# Patient Record
Sex: Male | Born: 1935 | Race: White | Hispanic: No | Smoking: Former smoker
Health system: Southern US, Community
[De-identification: ages and names within clinical notes are randomized; demographics above are authoritative.]

## PROBLEM LIST (undated history)

## (undated) DIAGNOSIS — Z87898 Personal history of other specified conditions: Secondary | ICD-10-CM

## (undated) DIAGNOSIS — Z972 Presence of dental prosthetic device (complete) (partial): Secondary | ICD-10-CM

## (undated) DIAGNOSIS — Z8711 Personal history of peptic ulcer disease: Secondary | ICD-10-CM

## (undated) DIAGNOSIS — Z8719 Personal history of other diseases of the digestive system: Secondary | ICD-10-CM

## (undated) DIAGNOSIS — G4700 Insomnia, unspecified: Secondary | ICD-10-CM

## (undated) DIAGNOSIS — H9190 Unspecified hearing loss, unspecified ear: Secondary | ICD-10-CM

## (undated) DIAGNOSIS — I10 Essential (primary) hypertension: Secondary | ICD-10-CM

## (undated) HISTORY — DX: Essential (primary) hypertension: I10

## (undated) HISTORY — PX: SEPTOPLASTY: SUR1290

## (undated) HISTORY — PX: FRACTURE SURGERY: SHX138

---

## 1964-05-05 HISTORY — PX: STOMACH SURGERY: SHX791

## 1997-05-05 HISTORY — PX: APPENDECTOMY: SHX54

## 1997-05-05 HISTORY — PX: HERNIA REPAIR: SHX51

## 2005-07-22 ENCOUNTER — Other Ambulatory Visit: Payer: Self-pay

## 2005-07-22 ENCOUNTER — Ambulatory Visit: Payer: Self-pay | Admitting: Otolaryngology

## 2005-07-23 ENCOUNTER — Ambulatory Visit: Payer: Self-pay | Admitting: Otolaryngology

## 2008-04-07 ENCOUNTER — Emergency Department: Payer: Self-pay | Admitting: Emergency Medicine

## 2008-04-17 ENCOUNTER — Emergency Department: Payer: Self-pay | Admitting: Emergency Medicine

## 2008-05-05 HISTORY — PX: BELPHAROPTOSIS REPAIR: SHX369

## 2008-07-25 ENCOUNTER — Emergency Department: Payer: Self-pay | Admitting: Emergency Medicine

## 2008-07-27 ENCOUNTER — Emergency Department: Payer: Self-pay | Admitting: Emergency Medicine

## 2010-08-03 IMAGING — CR RIGHT LITTLE FINGER 2+V
1 series · 3 of 3 positions shown · non-contrast
Comparison: none

REASON FOR EXAM: chainsaw to distal 5th digit pain
COMMENTS:

[Series 1: view not recorded · 0.17mm/px · 3 of 3 slices shown]
[im 1/3]
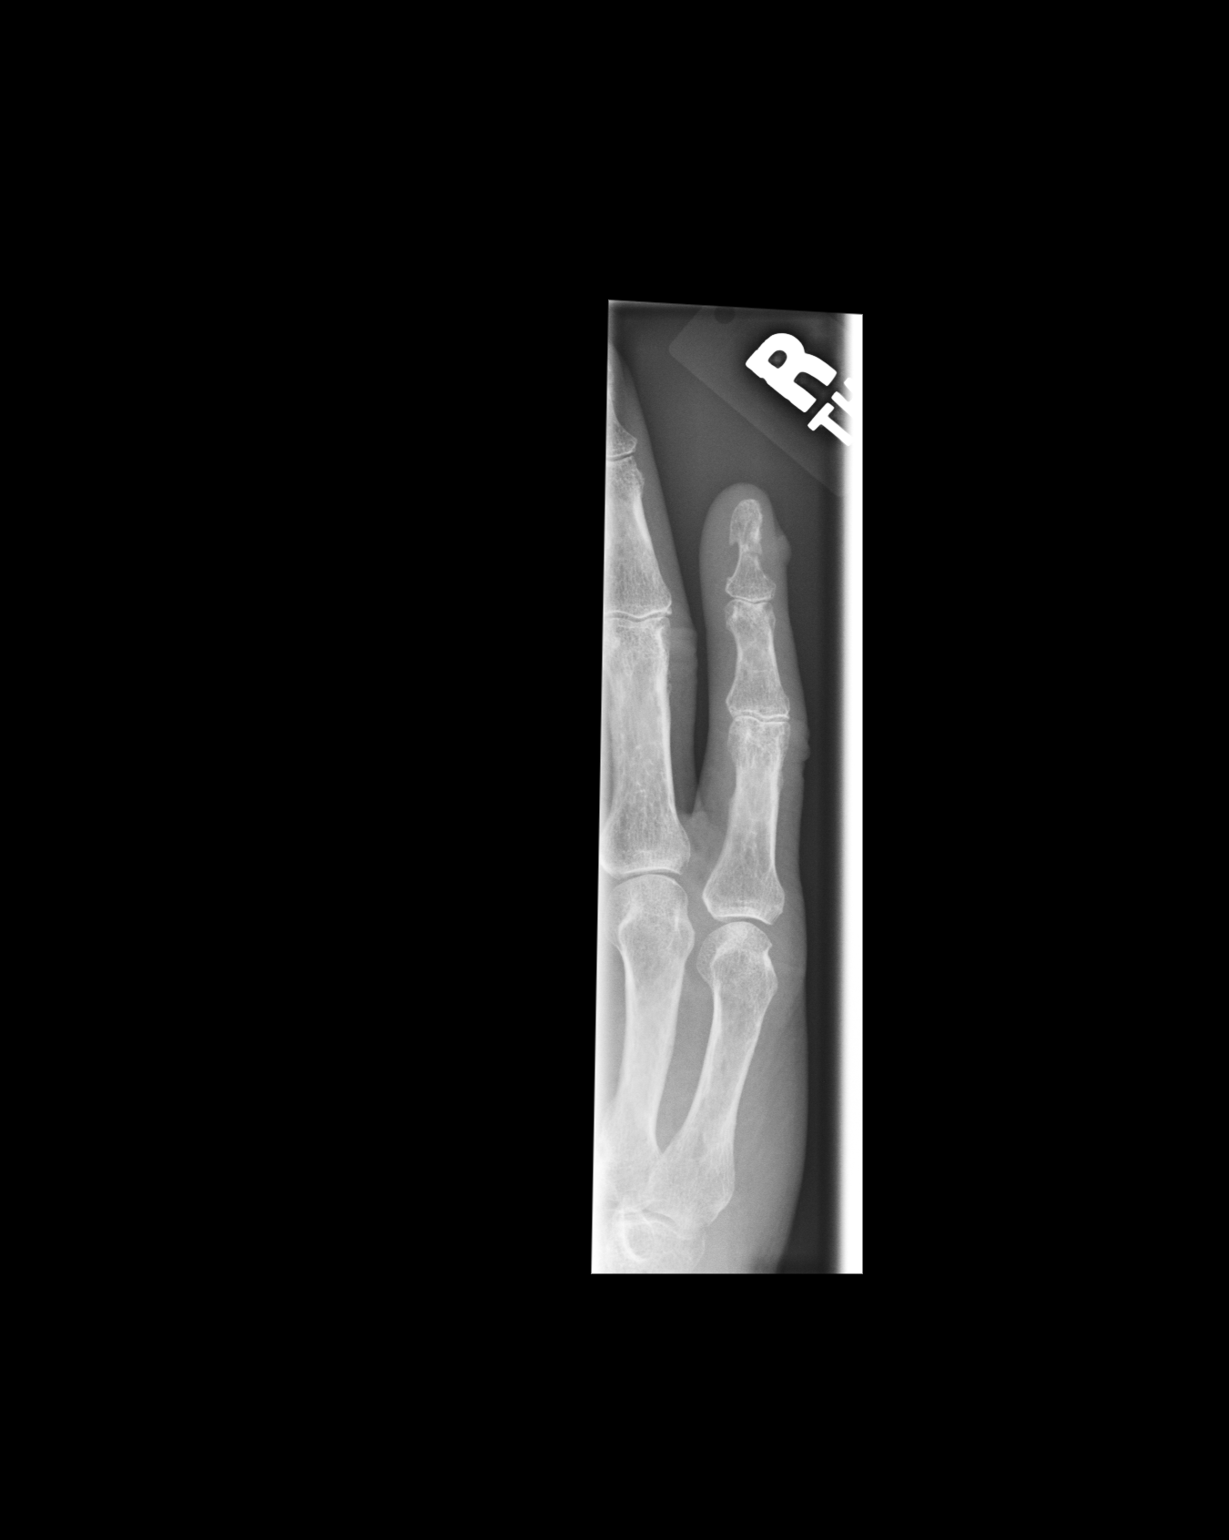
[im 2/3]
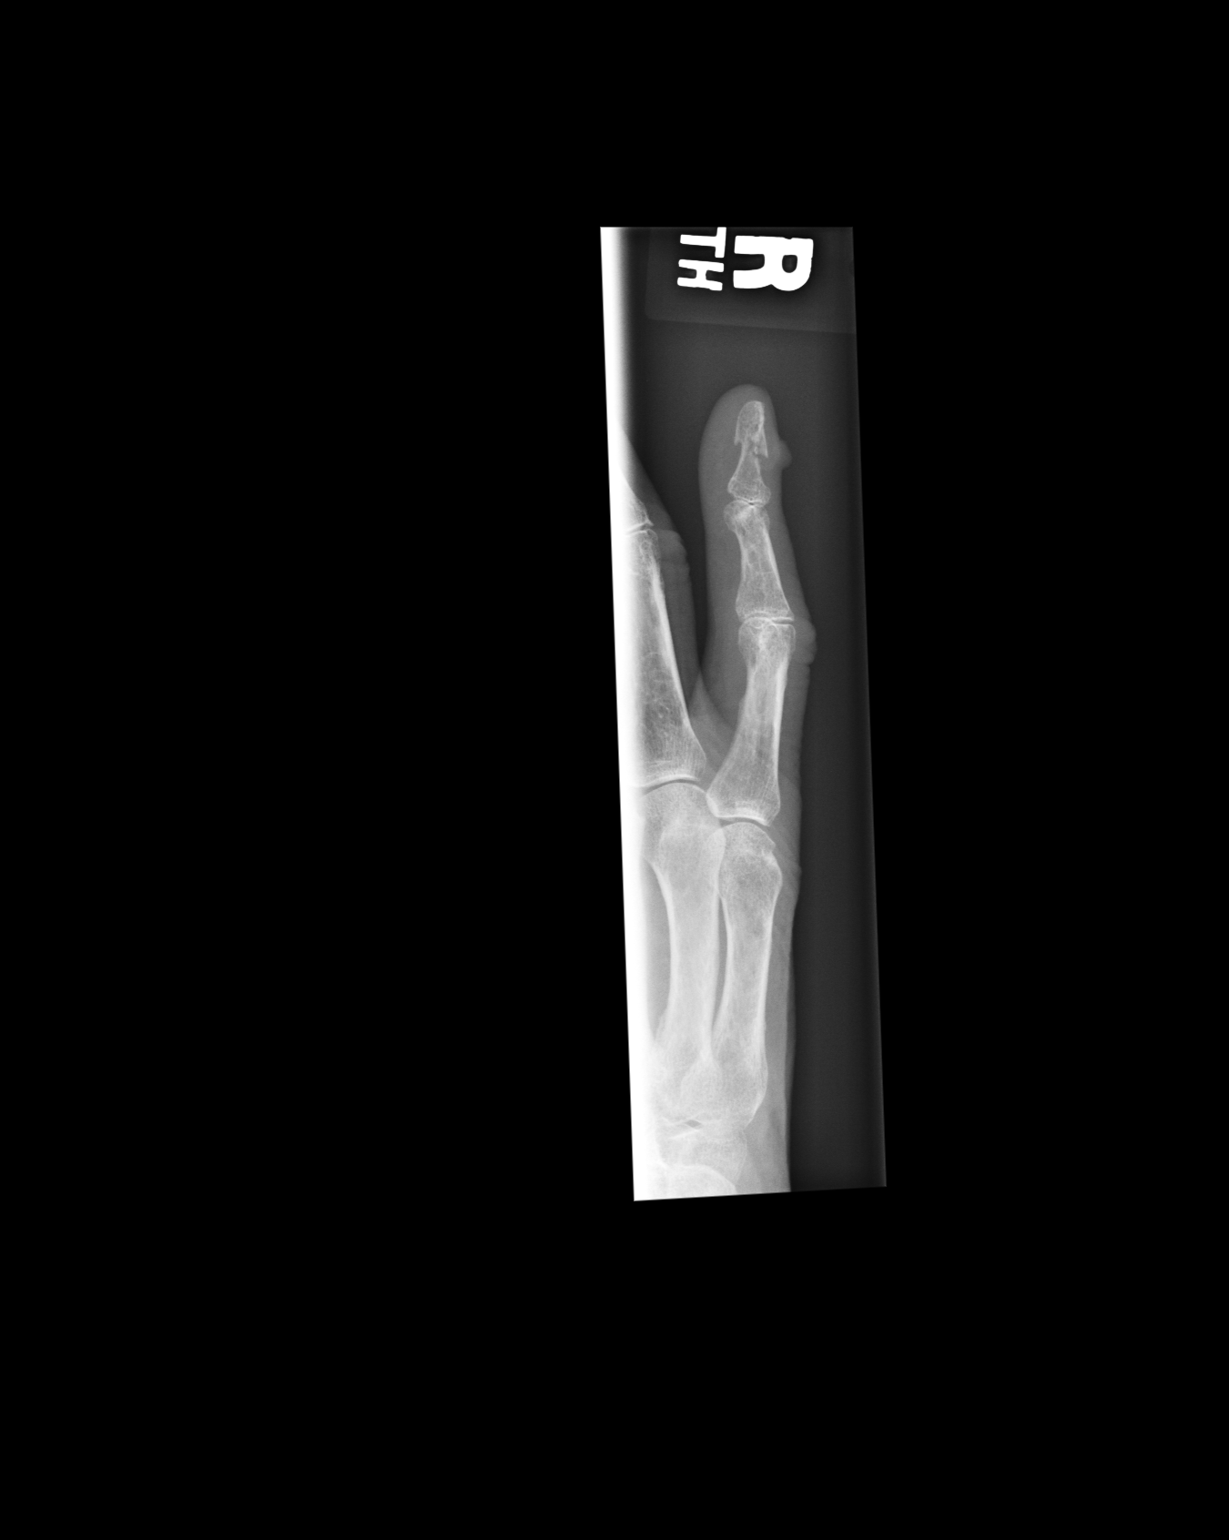
[im 3/3]
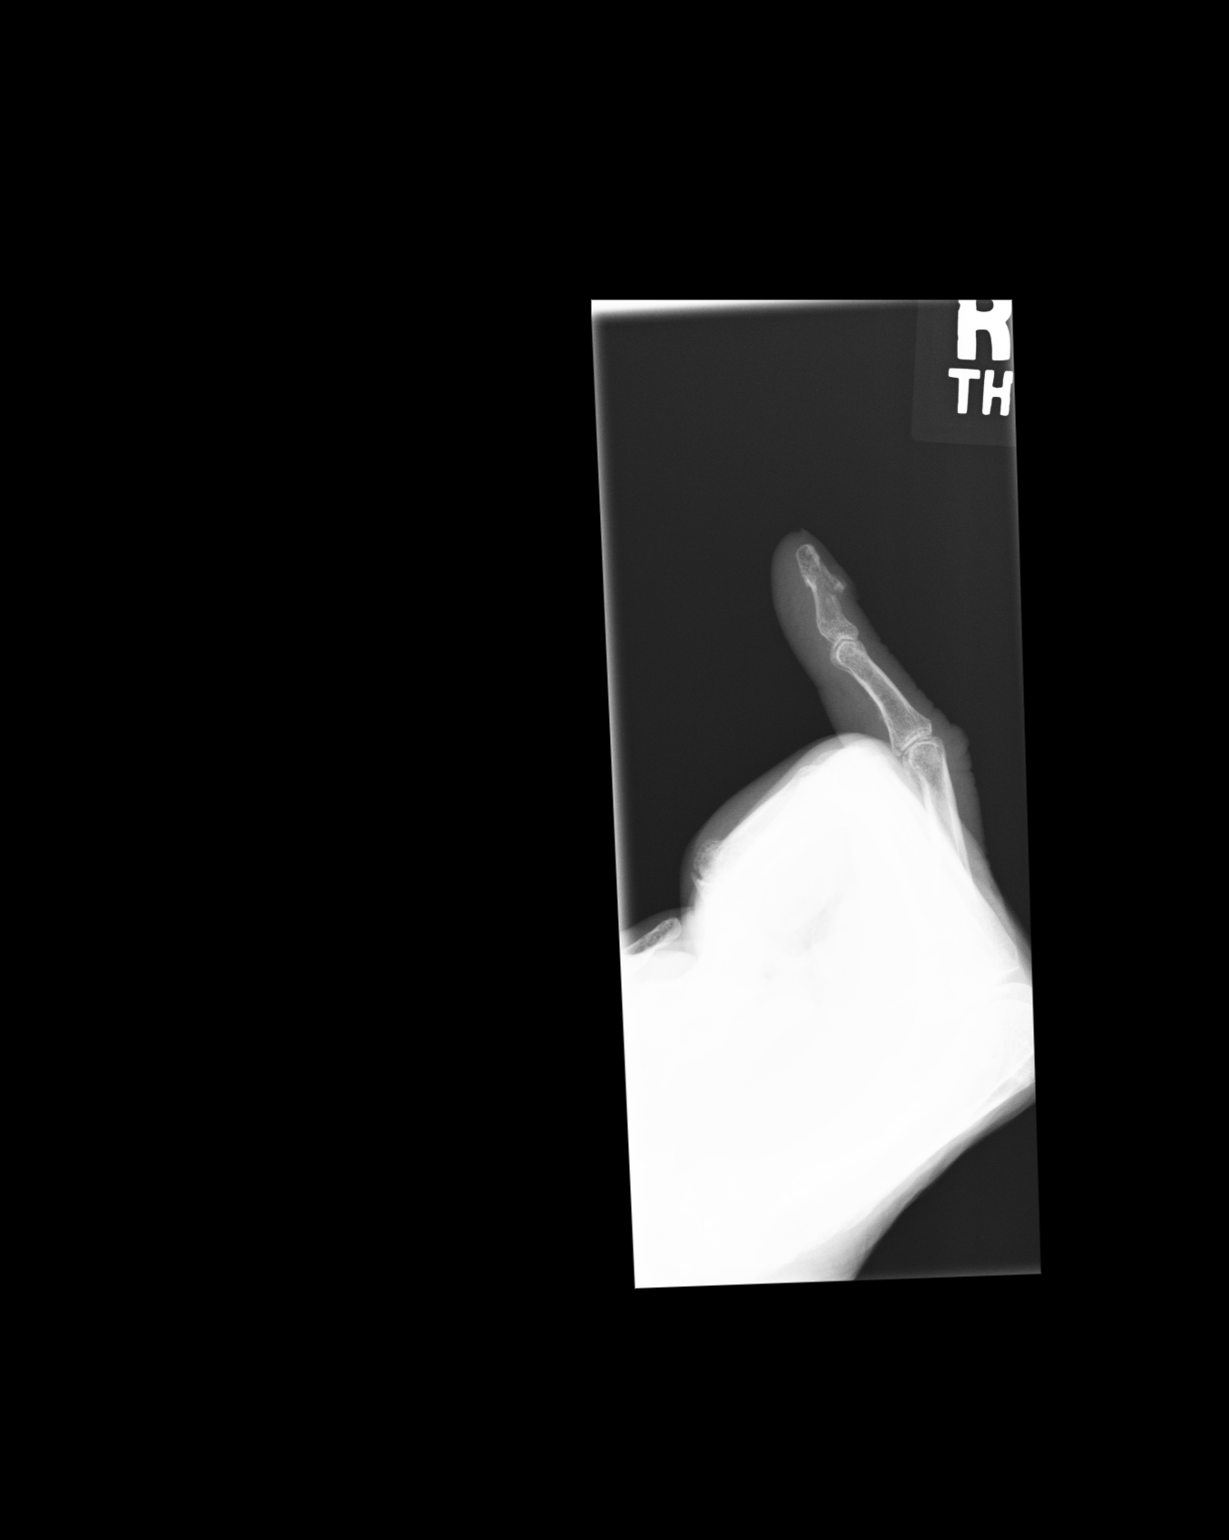

[3 of 3 positions shown; findings below may reference images not displayed]

PROCEDURE:     DXR - DXR FINGER PINKY 5TH DIGIT RT HA  - April 07, 2008 [DATE]

RESULT:     Three views were obtained. There is a fracture of the dorsal
cortex of the distal shaft of the distal phalanx of the fifth finger. The
fracture does not appear to extend entirely through the phalanx. There is
evidence of associated soft tissue injury. No foreign body is seen.
IMPRESSION: 1. There is a minimally displaced fracture of the dorsal cortex of the
diaphysis of the distal phalanx of the fifth finger.
2. Associated soft tissue injury is seen.
3. No radiodense soft tissue foreign body is noted.

## 2013-12-13 ENCOUNTER — Ambulatory Visit (INDEPENDENT_AMBULATORY_CARE_PROVIDER_SITE_OTHER): Payer: Medicare Other | Admitting: General Surgery

## 2013-12-13 ENCOUNTER — Encounter: Payer: Self-pay | Admitting: General Surgery

## 2013-12-13 VITALS — BP 118/80 | HR 76 | Resp 12 | Ht 71.0 in | Wt 167.0 lb

## 2013-12-13 DIAGNOSIS — L989 Disorder of the skin and subcutaneous tissue, unspecified: Secondary | ICD-10-CM

## 2013-12-13 NOTE — Patient Instructions (Signed)
Continue the prednisone every other day The patient is aware to call back for any questions or concerns.

## 2013-12-13 NOTE — Progress Notes (Signed)
Patient ID: Jacob Moses, male   DOB: 08-31-1927, 80 y.o.   MRN: [id number]  Chief Complaint  Patient presents with  . Other    right leg redness    HPI Jacob Moses is a 80 y.o. male.  Here today for right leg redness. States he has been treated by Dr. Koleen Nimrod for 16 months. It seems to have happened after he noticed a splinter about 19 months ago. It seems to have gotten worse and over a large area. It did have a rash at one point.  If there is any pain it comes and goes. The prednisone is every other day through August.    HPI  Past Medical History  Diagnosis Date  . Hypertension     Past Surgical History  Procedure Laterality Date  . Stomach surgery  1966    ulcer  . Appendectomy  1999    Dr.Byrnett  . Hernia repair  1999    Dr.Byrnett  . Belpharoptosis repair  2010    Family History  Problem Relation Age of Onset  . Diabetes Father   . Cancer Mother     Social History History  Substance Use Topics  . Smoking status: Former Smoker -- 30 years    Quit date: 05/05/1978  . Smokeless tobacco: Never Used  . Alcohol Use: No    No Known Allergies  Current Outpatient Prescriptions  Medication Sig Dispense Refill  . ALPRAZolam (XANAX) 1 MG tablet Take 1 mg by mouth at bedtime as needed for anxiety.      . ferrous sulfate 325 (65 FE) MG tablet Take 325 mg by mouth daily with breakfast.      . lisinopril (PRINIVIL,ZESTRIL) 20 MG tablet Take 20 mg by mouth 2 (two) times daily.      . Multiple Vitamins-Minerals (EYE VITAMINS PO) Take 1 tablet by mouth 2 (two) times daily.      . prednisoLONE 5 MG TABS tablet Take 5 mg by mouth every other day.        No current facility-administered medications for this visit.    Review of Systems Review of Systems  Constitutional: Negative.   Respiratory: Negative.   Cardiovascular: Negative.     Blood pressure 118/80, pulse 76, resp. rate 12, height 5\' 11"  (1.803 m), weight 167 lb (75.751 kg).  Physical  Exam Physical Exam  Constitutional: He is oriented to person, place, and time. He appears well-developed and well-nourished.  Cardiovascular:  Pulses:      Dorsalis pedis pulses are 1+ on the right side, and 2+ on the left side.       Posterior tibial pulses are 2+ on the right side, and 2+ on the left side.  No edema on left leg. Right leg with mild to moderate edema with mild induration of the skin extending half way up to the knee.  Neurological: He is alert and oriented to person, place, and time.  Skin: Skin is warm and dry.   The affected skin in right leg has no suggestion of venous stasis changes. There is no open area and no palpable foreign body. Data Reviewed Office notes and pathology reviewed. Pathology of skin biopsy from 1 yr ago showed findings suggestion of a FB reaction.   Assessment    No edema on left leg. Right leg with mild to moderate edema with mild induration of the skin extending half way up to the knee. The findings some sort of reactive process after he had a splinter  in the leg. Will discuss with pathologist and see if rebiopsy would be helpful.    Plan    Discuss with pathologist. Pt will be advised after.       SANKAR,SEEPLAPUTHUR G 12/15/2013, 8:06 AM

## 2013-12-15 ENCOUNTER — Encounter: Payer: Self-pay | Admitting: General Surgery

## 2013-12-19 ENCOUNTER — Telehealth: Payer: Self-pay | Admitting: *Deleted

## 2013-12-19 NOTE — Telephone Encounter (Signed)
Pts daughter called and said that you was going to speak with a pathologist about the pts leg and she was just wondering have you heard anything yet. She said to try her on her home number first [phone] or cell [phone]

## 2014-01-12 ENCOUNTER — Encounter: Payer: Self-pay | Admitting: General Surgery

## 2014-01-12 ENCOUNTER — Ambulatory Visit (INDEPENDENT_AMBULATORY_CARE_PROVIDER_SITE_OTHER): Payer: Medicare Other | Admitting: General Surgery

## 2014-01-12 VITALS — BP 150/80 | HR 70 | Resp 14 | Ht 71.0 in | Wt 162.0 lb

## 2014-01-12 DIAGNOSIS — L989 Disorder of the skin and subcutaneous tissue, unspecified: Secondary | ICD-10-CM

## 2014-01-12 DIAGNOSIS — D485 Neoplasm of uncertain behavior of skin: Secondary | ICD-10-CM

## 2014-01-12 NOTE — Progress Notes (Signed)
This is a 80 year old male here today for a right lower leg biopsy.  Right leg lower half medial with same nodular diffuse skin thickening.  5 ml 1% xylocane mixed with 0.5 % marcaine instilled over one of the nodular area. Prepped and draped-Chloro Prep. 1cm elliptical piece of skin excised. Skin closed with 3-0 nylon interrupted stitches. Dressed with neosporin, gauze and kling. No immediate problems from procedure

## 2014-01-12 NOTE — Patient Instructions (Signed)
Patient to return in 2 weeks for follow up. The patient is aware to call back for any questions or concerns.  

## 2014-01-16 ENCOUNTER — Encounter: Payer: Self-pay | Admitting: General Surgery

## 2014-01-24 ENCOUNTER — Ambulatory Visit (INDEPENDENT_AMBULATORY_CARE_PROVIDER_SITE_OTHER): Payer: Self-pay | Admitting: General Surgery

## 2014-01-24 ENCOUNTER — Encounter: Payer: Self-pay | Admitting: General Surgery

## 2014-01-24 VITALS — BP 172/74 | HR 78 | Resp 16 | Ht 71.0 in | Wt 168.0 lb

## 2014-01-24 DIAGNOSIS — L989 Disorder of the skin and subcutaneous tissue, unspecified: Secondary | ICD-10-CM

## 2014-01-24 NOTE — Patient Instructions (Signed)
Stop all creams and use utter cream.

## 2014-01-24 NOTE — Progress Notes (Signed)
This is a 80 year old male her today for his post op right leg biopsy done on 01/12/14. Patient states he is doing good but it is hurting more yesterday and today. States it "burns". The induration appears to be less in the skin of right leg except the superior edge .Biopsy site is healed. Pathology showed non specific reactive dermatitis. Additional consultation from dermatopathologist has been requested.  Sutures removed. Stop all creams he is using now and use udderr cream in place Follow up in 3 weeks.

## 2014-01-25 ENCOUNTER — Encounter: Payer: Self-pay | Admitting: General Surgery

## 2014-02-16 ENCOUNTER — Ambulatory Visit (INDEPENDENT_AMBULATORY_CARE_PROVIDER_SITE_OTHER): Payer: Medicare Other | Admitting: General Surgery

## 2014-02-16 ENCOUNTER — Encounter: Payer: Self-pay | Admitting: General Surgery

## 2014-02-16 VITALS — BP 142/64 | HR 70 | Resp 14 | Ht 71.0 in | Wt 169.0 lb

## 2014-02-16 DIAGNOSIS — L989 Disorder of the skin and subcutaneous tissue, unspecified: Secondary | ICD-10-CM

## 2014-02-16 DIAGNOSIS — L259 Unspecified contact dermatitis, unspecified cause: Secondary | ICD-10-CM

## 2014-02-16 NOTE — Progress Notes (Signed)
This is a 80 year old male her today for his post op right leg biopsy done on 01/12/14. Patient states he is doing good   Overall appearance of right leg has improved. Induration is less marked. Mild swelling of the lower half of the leg.   Maintains strong posterior tib pulse. No varicose veins seen. Continue with use of udder cream and support stockings.  Pathology revealing reactive dermatitis

## 2014-02-18 ENCOUNTER — Encounter: Payer: Self-pay | Admitting: General Surgery

## 2014-02-21 LAB — PATHOLOGY

## 2014-10-10 ENCOUNTER — Ambulatory Visit (INDEPENDENT_AMBULATORY_CARE_PROVIDER_SITE_OTHER): Payer: Medicare Other | Admitting: General Surgery

## 2014-10-10 ENCOUNTER — Encounter: Payer: Self-pay | Admitting: General Surgery

## 2014-10-10 VITALS — BP 122/66 | HR 64 | Resp 12 | Ht 71.0 in | Wt 160.0 lb

## 2014-10-10 DIAGNOSIS — L309 Dermatitis, unspecified: Secondary | ICD-10-CM | POA: Diagnosis not present

## 2014-10-10 NOTE — Progress Notes (Deleted)
Patient here for right leg lesion evaluation. Patient reports area has increased in size, area has minimal swelling and patient's daughter states area is much more red in color.

## 2014-10-10 NOTE — Progress Notes (Signed)
Patient ID: Jacob Moses, male   DOB: [birth year], 81 y.o.   MRN: [id number]  Chief Complaint  Patient presents with  . Follow-up    right leg lesion    HPI Jacob Moses is a 81 y.o. male here for right leg lesion revaluation. Patient reports area has increased in size, area has minimal swelling and patient's daughter states area is much more red in color.  HPI  Past Medical History  Diagnosis Date  . Hypertension     Past Surgical History  Procedure Laterality Date  . Stomach surgery  1966    ulcer  . Appendectomy  1999    Dr.Byrnett  . Hernia repair  1999    Dr.Byrnett  . Belpharoptosis repair  2010    Family History  Problem Relation Age of Onset  . Diabetes Father   . Cancer Mother     Social History History  Substance Use Topics  . Smoking status: Former Smoker -- 30 years    Quit date: 05/05/1978  . Smokeless tobacco: Never Used  . Alcohol Use: No    No Known Allergies  Current Outpatient Prescriptions  Medication Sig Dispense Refill  . ALPRAZolam (XANAX) 1 MG tablet Take 1 mg by mouth at bedtime as needed for anxiety.    . ferrous sulfate 325 (65 FE) MG tablet Take 325 mg by mouth daily with breakfast.    . lisinopril (PRINIVIL,ZESTRIL) 20 MG tablet Take 20 mg by mouth 2 (two) times daily.    . Multiple Vitamins-Minerals (EYE VITAMINS PO) Take 1 tablet by mouth 2 (two) times daily.     No current facility-administered medications for this visit.    Review of Systems Review of Systems  Constitutional: Negative.   Respiratory: Negative.   Cardiovascular: Negative.     Blood pressure 122/66, pulse 64, resp. rate 12, height 5\' 11"  (1.803 m), weight 160 lb (72.576 kg).  Physical Exam Physical Exam  Cardiovascular:  Pulses:      Dorsalis pedis pulses are 0 on the right side, and 1+ on the left side.       Posterior tibial pulses are 2+ on the right side, and 2+ on the left side.  Right leg with lower 2/3-anteromedial showing brown/red  discoloration, areas mild skin thickening. Edema is minimal. Feet are warm, cap refill is less than 2secs.    Data Reviewed Previous notes and pathology.  Assessment    Flare up of reactive dermatitis in right lower leg-not as bad as it was couple of yrs ago.    Plan    Continue to use udder cream. If it gets worse will use unna boot. Patient and daughter advised and agree.        Jacob Moses G 10/10/2014, 11:18 AM

## 2015-01-24 ENCOUNTER — Other Ambulatory Visit: Payer: Self-pay | Admitting: Internal Medicine

## 2015-01-24 DIAGNOSIS — R634 Abnormal weight loss: Secondary | ICD-10-CM

## 2015-01-24 DIAGNOSIS — R748 Abnormal levels of other serum enzymes: Secondary | ICD-10-CM

## 2015-01-25 ENCOUNTER — Ambulatory Visit
Admission: RE | Admit: 2015-01-25 | Discharge: 2015-01-25 | Disposition: A | Discharge status: Home / Self Care | Payer: Medicare Other | Source: Ambulatory Visit | Attending: Internal Medicine | Admitting: Internal Medicine

## 2015-01-25 DIAGNOSIS — R748 Abnormal levels of other serum enzymes: Secondary | ICD-10-CM | POA: Diagnosis present

## 2015-01-25 DIAGNOSIS — R634 Abnormal weight loss: Secondary | ICD-10-CM | POA: Insufficient documentation

## 2015-01-25 DIAGNOSIS — N2889 Other specified disorders of kidney and ureter: Secondary | ICD-10-CM | POA: Insufficient documentation

## 2015-04-17 ENCOUNTER — Encounter: Payer: Self-pay | Admitting: *Deleted

## 2015-04-23 NOTE — Discharge Instructions (Signed)
INSTRUCTIONS FOLLOWING OCULOPLASTIC SURGERY °AMY M. FOWLER, MD ° °AFTER YOUR EYE SURGERY, THER ARE MANY THINGS THWIHC YOU, THE PATIENT, CAN DO TO ASSURE THE BEST POSSIBLE RESULT FROM YOUR OPERATION.  THIS SHEET SHOULD BE REFERRED TO WHENEVER QUESTIONS ARISE.  IF THERE ARE ANY QUESTIONS NOT ANSWERED HERE, DO NOT HESITATE TO CALL OUR OFFICE AT [phone] OR [phone].  THERE IS ALWAYS [facility] AVAILABLE TO CALL IF QUESTIONS OR PROBLEMS ARISE. ° °VISION: Your vision may be blurred and out of focus after surgery until you are able to stop using your ointment, swelling resolves and your eye(s) heal. This may take 1 to 2 weeks at the least.  If your vision becomes gradually more dim or dark, this is not normal and you need to call our office immediately. ° °EYE CARE: For the first 48 hours after surgery, use ice packs frequently - “20 minutes on, 20 minutes off” - to help reduce swelling and bruising.  Small bags of frozen peas or corn make good ice packs along with cloths soaked in ice water.  If you are wearing a patch or other type of dressing following surgery, keep this on for the amount of time specified by your doctor.  For the first week following surgery, you will need to treat your stitches with great care.  If is OK to shower, but take care to not allow soapy water to run into your eye(s) to help reduce changes of infection.  You may gently clean the eyelashes and around the eye(s) with cotton balls and sterile water, BUT DO NOT RUB THE STITCHES VIGOROUSLY.  Keeping your stitches moist with ointment will help promote healing with minimal scar formation. ° °ACTIVITY: When you leave the surgery center, you should go home, rest and be inactive.  The eye(s) may feel scratchy and keeping the eyes closed will allow for faster healing.  The first week following surgery, avoid straining (anything making the face turn red) or lifting over 20 pounds.  Additionally, avoid bending which causes your head to go below  your waist.  Using your eyes will NOT harm them, so feel free to read, watch television, use the computer, etc as desired.  Driving depends on each individual, so check with your doctor if you have questions about driving. ° °MEDICATIONS:  You will be given a prescription for an ointment to use 4 times a day on your stitches.  You can use the ointment in your eyes if they feel scratchy or irritated.  If you eyelid(s) don’t close completely when you sleep, put some ointment in your eyes before bedtime. ° °EMERGENCY: If you experience SEVERE EYE PAIN OR HEADACHE UNRELIEVED BY TYLENOL OR PERCOCET, NAUSEA OR VOMITING, WORSENING REDNESS, OR WORSENING VISION (ESPECIALLY VISION THAT WA INITIALLY BETTER) CALL [phone] OR [phone] DURING BUSINESS HOURS OR AFTER HOURS. ° °General Anesthesia, Adult, Care After °Refer to this sheet in the next few weeks. These instructions provide you with information on caring for yourself after your procedure. Your health care provider may also give you more specific instructions. Your treatment has been planned according to current medical practices, but problems sometimes occur. Call your health care provider if you have any problems or questions after your procedure. °WHAT TO EXPECT AFTER THE PROCEDURE °After the procedure, it is typical to experience: °· Sleepiness. °· Nausea and vomiting. °HOME CARE INSTRUCTIONS °· For the first 24 hours after general anesthesia: °¨ Have a responsible person with you. °¨ Do not drive a car. If you   are alone, do not take public transportation. °¨ Do not drink alcohol. °¨ Do not take medicine that has not been prescribed by your health care provider. °¨ Do not sign important papers or make important decisions. °¨ You may resume a normal diet and activities as directed by your health care provider. °· Change bandages (dressings) as directed. °· If you have questions or problems that seem related to general anesthesia, call the hospital and ask for  the anesthetist or anesthesiologist on call. °SEEK MEDICAL CARE IF: °· You have nausea and vomiting that continue the day after anesthesia. °· You develop a rash. °SEEK IMMEDIATE MEDICAL CARE IF:  °· You have difficulty breathing. °· You have chest pain. °· You have any allergic problems. °  °This information is not intended to replace advice given to you by your health care provider. Make sure you discuss any questions you have with your health care provider. °  °Document Released: 07/28/2000 Document Revised: 05/12/2014 Document Reviewed: 08/20/2011 °Elsevier Interactive Patient Education ©2016 [employer] ° °

## 2015-04-24 ENCOUNTER — Ambulatory Visit
Admission: RE | Admit: 2015-04-24 | Discharge: 2015-04-24 | Disposition: A | Discharge status: Home / Self Care | Payer: Medicare Other | Source: Ambulatory Visit | Attending: Ophthalmology | Admitting: Ophthalmology

## 2015-04-24 ENCOUNTER — Ambulatory Visit: Payer: Medicare Other | Admitting: Anesthesiology

## 2015-04-24 ENCOUNTER — Encounter
Admission: RE | Disposition: A | Discharge status: Home / Self Care | Payer: Self-pay | Source: Ambulatory Visit | Attending: Ophthalmology

## 2015-04-24 DIAGNOSIS — H0289 Other specified disorders of eyelid: Secondary | ICD-10-CM | POA: Insufficient documentation

## 2015-04-24 DIAGNOSIS — H04522 Eversion of left lacrimal punctum: Secondary | ICD-10-CM | POA: Diagnosis not present

## 2015-04-24 DIAGNOSIS — H02105 Unspecified ectropion of left lower eyelid: Secondary | ICD-10-CM | POA: Insufficient documentation

## 2015-04-24 DIAGNOSIS — Z79899 Other long term (current) drug therapy: Secondary | ICD-10-CM | POA: Diagnosis not present

## 2015-04-24 DIAGNOSIS — H9190 Unspecified hearing loss, unspecified ear: Secondary | ICD-10-CM | POA: Insufficient documentation

## 2015-04-24 DIAGNOSIS — I10 Essential (primary) hypertension: Secondary | ICD-10-CM | POA: Diagnosis not present

## 2015-04-24 DIAGNOSIS — H02102 Unspecified ectropion of right lower eyelid: Secondary | ICD-10-CM | POA: Insufficient documentation

## 2015-04-24 DIAGNOSIS — Z87891 Personal history of nicotine dependence: Secondary | ICD-10-CM | POA: Insufficient documentation

## 2015-04-24 HISTORY — DX: Presence of dental prosthetic device (complete) (partial): Z97.2

## 2015-04-24 HISTORY — DX: Personal history of other specified conditions: Z87.898

## 2015-04-24 HISTORY — PX: LACRIMAL DUCT EXPLORATION: SHX6569

## 2015-04-24 HISTORY — DX: Personal history of peptic ulcer disease: Z87.11

## 2015-04-24 HISTORY — PX: ECTROPION REPAIR: SHX357

## 2015-04-24 HISTORY — DX: Unspecified hearing loss, unspecified ear: H91.90

## 2015-04-24 HISTORY — DX: Personal history of other diseases of the digestive system: Z87.19

## 2015-04-24 SURGERY — REPAIR, ECTROPION, EYELID
Anesthesia: Monitor Anesthesia Care | Site: Eye | Laterality: Bilateral | Wound class: Clean

## 2015-04-24 MED ORDER — LIDOCAINE HCL (CARDIAC) 20 MG/ML IV SOLN
INTRAVENOUS | Status: DC | PRN
  Administered 2015-04-24: 40 mg via INTRAVENOUS

## 2015-04-24 MED ORDER — ALFENTANIL 500 MCG/ML IJ INJ
INJECTION | INTRAMUSCULAR | Status: DC | PRN
  Administered 2015-04-24: 100 ug via INTRAVENOUS
  Administered 2015-04-24: 400 ug via INTRAVENOUS
  Administered 2015-04-24: 100 ug via INTRAVENOUS

## 2015-04-24 MED ORDER — MIDAZOLAM HCL 2 MG/2ML IJ SOLN
INTRAMUSCULAR | Status: DC | PRN
  Administered 2015-04-24 (×2): 1 mg via INTRAVENOUS

## 2015-04-24 MED ORDER — FENTANYL CITRATE (PF) 100 MCG/2ML IJ SOLN: 25.0000 ug | INTRAMUSCULAR | Status: DC | PRN

## 2015-04-24 MED ORDER — TETRACAINE HCL 0.5 % OP SOLN
OPHTHALMIC | Status: DC | PRN
  Administered 2015-04-24: 2 [drp] via OPHTHALMIC

## 2015-04-24 MED ORDER — PROPOFOL 500 MG/50ML IV EMUL
INTRAVENOUS | Status: DC | PRN
Start: 2015-04-24 — End: 2015-04-24
  Administered 2015-04-24: 25 ug/kg/min via INTRAVENOUS

## 2015-04-24 MED ORDER — BSS IO SOLN
INTRAOCULAR | Status: DC | PRN
  Administered 2015-04-24: 15 mL

## 2015-04-24 MED ORDER — OXYCODONE HCL 5 MG PO TABS: 5.0000 mg | ORAL_TABLET | Freq: Once | ORAL | Status: DC | PRN

## 2015-04-24 MED ORDER — LIDOCAINE-EPINEPHRINE 2 %-1:100000 IJ SOLN
INTRAMUSCULAR | Status: DC | PRN
  Administered 2015-04-24: 6 mL via OPHTHALMIC

## 2015-04-24 MED ORDER — ERYTHROMYCIN 5 MG/GM OP OINT
TOPICAL_OINTMENT | OPHTHALMIC | Status: DC | PRN
  Administered 2015-04-24: 1 via OPHTHALMIC

## 2015-04-24 MED ORDER — ERYTHROMYCIN 5 MG/GM OP OINT: TOPICAL_OINTMENT | OPHTHALMIC | Status: DC

## 2015-04-24 MED ORDER — LACTATED RINGERS IV SOLN
INTRAVENOUS | Status: DC
  Administered 2015-04-24: 11:00:00 via INTRAVENOUS

## 2015-04-24 MED ORDER — ONDANSETRON HCL 4 MG/2ML IJ SOLN: 4.0000 mg | Freq: Once | INTRAMUSCULAR | Status: DC | PRN

## 2015-04-24 MED ORDER — OXYCODONE HCL 5 MG/5ML PO SOLN: 5.0000 mg | Freq: Once | ORAL | Status: DC | PRN

## 2015-04-24 MED ORDER — NEOMYCIN-POLYMYXIN-DEXAMETH 3.5-10000-0.1 OP SUSP: OPHTHALMIC | Status: DC

## 2015-04-24 MED ORDER — OXYCODONE-ACETAMINOPHEN 5-325 MG PO TABS: 1.0000 | ORAL_TABLET | ORAL | Status: DC | PRN

## 2015-04-24 SURGICAL SUPPLY — 40 items
APPLICATOR COTTON TIP WD 3 STR (MISCELLANEOUS) ×4 IMPLANT
BLADE SURG 15 STRL LF DISP TIS (BLADE) ×1 IMPLANT
BLADE SURG 15 STRL SS (BLADE) ×1
CORD BIP STRL DISP 12FT (MISCELLANEOUS) ×2 IMPLANT
DRAPE HEAD BAR (DRAPES) ×2 IMPLANT
GAUZE SPONGE 4X4 12PLY STRL (GAUZE/BANDAGES/DRESSINGS) ×2 IMPLANT
GAUZE SPONGE NON-WVN 2X2 STRL (MISCELLANEOUS) ×10 IMPLANT
GLOVE BIO SURGEON STRL SZ7 (GLOVE) ×2 IMPLANT
GLOVE SURG LX 7.0 MICRO (GLOVE) ×2
GLOVE SURG LX STRL 7.0 MICRO (GLOVE) ×2 IMPLANT
MARKER SKIN XFINE TIP W/RULER (MISCELLANEOUS) ×2 IMPLANT
NEEDLE FILTER BLUNT 18X 1/2SAF (NEEDLE) ×1
NEEDLE FILTER BLUNT 18X1 1/2 (NEEDLE) ×1 IMPLANT
NEEDLE HYPO 30X.5 LL (NEEDLE) ×4 IMPLANT
PACK DRAPE NASAL/ENT (PACKS) ×2 IMPLANT
SOL BAL SALT 15ML (MISCELLANEOUS) ×2
SOL PREP PVP 2OZ (MISCELLANEOUS) ×2
SOLUTION BAL SALT 15ML (MISCELLANEOUS) ×1 IMPLANT
SOLUTION PREP PVP 2OZ (MISCELLANEOUS) ×1 IMPLANT
SPONGE VERSALON 2X2 STRL (MISCELLANEOUS) ×10
SUT CHROMIC 4-0 (SUTURE) ×1
SUT CHROMIC 4-0 M2 12X2 ARM (SUTURE) ×1
SUT CHROMIC 5 0 P 3 (SUTURE) IMPLANT
SUT ETHILON 4 0 CL P 3 (SUTURE) IMPLANT
SUT MERSILENE 4-0 S-2 (SUTURE) ×4 IMPLANT
SUT PDS AB 4-0 P3 18 (SUTURE) IMPLANT
SUT PLAIN GUT (SUTURE) ×2 IMPLANT
SUT PROLENE 5 0 P 3 (SUTURE) IMPLANT
SUT PROLENE 6 0 P 1 18 (SUTURE) IMPLANT
SUT SILK 4 0 G 3 (SUTURE) IMPLANT
SUT VIC AB 5-0 P-3 18X BRD (SUTURE) IMPLANT
SUT VIC AB 5-0 P3 18 (SUTURE)
SUT VICRYL 6-0  S14 CTD (SUTURE)
SUT VICRYL 6-0 S14 CTD (SUTURE) IMPLANT
SUT VICRYL 7 0 TG140 8 (SUTURE) IMPLANT
SUTURE CHRMC 4-0 M2 12X2 ARM (SUTURE) ×1 IMPLANT
SYR 3ML LL SCALE MARK (SYRINGE) ×2 IMPLANT
SYRINGE 10CC LL (SYRINGE) ×2 IMPLANT
TOWEL OR 17X26 4PK STRL BLUE (TOWEL DISPOSABLE) ×2 IMPLANT
WATER STERILE IRR 500ML POUR (IV SOLUTION) ×2 IMPLANT

## 2015-04-24 NOTE — H&P (Signed)
  See history and physical completed at [facility] dated 04/09/2015 that has been scanned onto the chart

## 2015-04-24 NOTE — Anesthesia Preprocedure Evaluation (Signed)
Anesthesia Evaluation  Patient identified by MRN, date of birth, ID band Patient awake    Reviewed: Allergy & Precautions, H&P , NPO status   Airway Mallampati: II  TM Distance: >3 FB Neck ROM: full    Dental  (+) Upper Dentures, Lower Dentures   Pulmonary former smoker,    breath sounds clear to auscultation       Cardiovascular hypertension, Normal cardiovascular exam     Neuro/Psych    GI/Hepatic   Endo/Other    Renal/GU      Musculoskeletal   Abdominal   Peds  Hematology   Anesthesia Other Findings   Reproductive/Obstetrics                             Anesthesia Physical Anesthesia Plan  ASA: II  Anesthesia Plan: MAC   Post-op Pain Management:    Induction:   Airway Management Planned:   Additional Equipment:   Intra-op Plan:   Post-operative Plan:   Informed Consent: I have reviewed the patients History and Physical, chart, labs and discussed the procedure including the risks, benefits and alternatives for the proposed anesthesia with the patient or authorized representative who has indicated his/her understanding and acceptance.     Plan Discussed with: CRNA  Anesthesia Plan Comments:         Anesthesia Quick Evaluation

## 2015-04-24 NOTE — Anesthesia Procedure Notes (Signed)
Procedure Name: MAC Performed by: Eulala Newcombe Pre-anesthesia Checklist: Patient identified, Emergency Drugs available, Suction available, Timeout performed and Patient being monitored Patient Re-evaluated:Patient Re-evaluated prior to inductionOxygen Delivery Method: Nasal cannula Placement Confirmation: positive ETCO2       

## 2015-04-24 NOTE — Op Note (Signed)
Preoperative Diagnosis:   1.  Lower eyelid laxity with ectropion, both lower eyelid(s). 2.  Punctal eversion left lower eyelid(s) 3.  Punctal stenosis both lower puncta   Postoperative Diagnosis:   Same.  Procedure(s) Performed:  1.  Lateral tarsal strip procedure,  both  lower eyelid(s) 2.  Medial spindle (tarsal wedge excision) left lower eyelid(s) 3.  3 snip punctoplasty both lower lids   Surgeon: Philis Pique. Vickki Muff, M.D.  Assistants: none  Anesthesia: MAC  Specimens: None.  Estimated Blood Loss: Minimal.  Complications: None.  Operative Findings: None   Procedure:   Allergies were reviewed and the patient Review of patient's allergies indicates no known allergies..    After discussing the risks, benefits, complications, and alternatives with the patient, appropriate informed consent was obtained. The patient was brought to the operating suite and reclined supine. Time out was conducted and the patient was sedated.  Local anesthetic consisting of a 50-50 mixture of 2% lidocaine with epinephrine and 0.75% bupivacaine with added Hylenex was injected subcutaneously to the bilateral lateral canthal region(s) and lower eyelid(s). Additional anesthetic was injected subconjunctivally to the bilateral lower eyelid(s). Finally, anesthetic was injected down to the periosteum of the bilateral lateral orbital rim(s).  After adequate local was instilled, the patient was prepped and draped in the usual sterile fashion for eyelid surgery.   Attention was turned to the left lower lid punctum. A punctal dilator was used to dilate the stenotic punctum. Micro-Westcott scissors were used to create 3 snip punctoplasty. There were some symblepharon between the globe and posterior lamella inferior to the punctum and this was also divided with Wescott scissors.  Attention was turned to the left lateral canthal angle. Westcott scissors were used to create a lateral canthotomy. Hemostasis was obtained with  bipolar cautery. An inferior cantholysis was then performed with additional bipolar hemostasis. The anterior and posterior lamella of the lid were divided for approximately 8 mm.  A strip of the epithelium was excised off the superior margin of the tarsal strip and conjunctiva and retractors were incised off the inferior margin of the tarsal strip.  Attention was then turned to the medial portion of the lid. An ellipse of conjunctiva, retractors and inferior tarsal margin was excised inferior to the punctum. Hemostasis was obtained with bipolar cautery. A double-armed 4-0 chromic suture was then passed through the tarsal edge first then the conjunctival and retractor borders and finally from posterior to anterior through the skin and tied off. This provided nice inward rotation of the lower eyelid medially   A double-armed 4-0 [place] suture was then passed each arm through the terminal portion of the tarsal strip. Each arm of the suture was then passed through the periosteum of the inner portion of the lateral orbital rim at the level of Whitnall's tubercle. The sutures were advanced and this provided nice elevation and tightening of the lower eyelid. Once the suture was secured, a thin strip of follicle-bearing skin was excised. The lateral canthal angle was reformed with an interrupted 6-0 fast absorbing plain suture. Orbicularis was reapproximated with horizontal subcuticular 6-0 fast absorbing plain gut sutures. The skin was closed with interrupted 6-0 fast absorbing plain gut sutures.   Attention was then turned to the opposite eyelid where the tarsal strip and punctoplasty procedure was performed in the same manner.  The patient tolerated the procedure well. Erythromycin ophthalmic ointment was applied to the incision site(s) followed by ice packs. The patient was taken to the recovery area where he  recovered without difficulty.  Post-Op Plan/Instructions:  The patient was instructed to use ice  packs frequently for the next 48 hours. He was instructed to use bacitracin ophthalmic ointment on his incisions and Maxitrol drops in both eyes 4 times a day for the next 12 to 14 days. He was given a prescription for Percocet for pain control should Tylenol not be effective. He was asked to to follow up at the [facility] in [place], [place] in 2 weeks' time or sooner as needed for problems.  Surina Storts M. Vickki Muff, M.D. Attending,Ophthalmology

## 2015-04-24 NOTE — Anesthesia Postprocedure Evaluation (Signed)
Anesthesia Post Note  Patient: Jacob Moses  Procedure(s) Performed: Procedure(s) (LRB): REPAIR OF ECTROPION TARSAL WEDGE LEFT EYE REPAIR EXTENSIVE BILATERAL (Bilateral) LACRIMAL DUCT EXPLORATION PUNCTOPLASTY (Bilateral)  Patient location during evaluation: PACU Anesthesia Type: MAC Level of consciousness: awake and alert Pain management: pain level controlled Vital Signs Assessment: post-procedure vital signs reviewed and stable Respiratory status: spontaneous breathing, nonlabored ventilation, respiratory function stable and patient connected to nasal cannula oxygen Cardiovascular status: stable and blood pressure returned to baseline Anesthetic complications: no    Amaryllis Dyke

## 2015-04-24 NOTE — Transfer of Care (Signed)
Immediate Anesthesia Transfer of Care Note  Patient: Jacob Moses  Procedure(s) Performed: Procedure(s): REPAIR OF ECTROPION TARSAL WEDGE LEFT EYE REPAIR EXTENSIVE BILATERAL (Bilateral) LACRIMAL DUCT EXPLORATION PUNCTOPLASTY (Bilateral)  Patient Location: PACU  Anesthesia Type: MAC  Level of Consciousness: awake, alert  and patient cooperative  Airway and Oxygen Therapy: Patient Spontanous Breathing and Patient connected to supplemental oxygen  Post-op Assessment: Post-op Vital signs reviewed, Patient's Cardiovascular Status Stable, Respiratory Function Stable, Patent Airway and No signs of Nausea or vomiting  Post-op Vital Signs: Reviewed and stable  Complications: No apparent anesthesia complications

## 2015-04-24 NOTE — Interval H&P Note (Signed)
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 After consideration of risks, benefits and other options for treatment, the patient has consented to  Procedure(s): REPAIR OF ECTROPION TARSAL WEDGE LEFT EYE REPAIR EXTENSIVE BILATERAL (Bilateral) LACRIMAL DUCT EXPLORATION PUNCTOPLASTY (Bilateral) as a surgical intervention .  The patient's history has been reviewed, patient examined, no change in status, stable for surgery.  I have reviewed the patient's chart and labs.  Questions were answered to the patient's satisfaction.     Vickki Muff, Jennifer Holland M

## 2015-04-25 ENCOUNTER — Encounter: Payer: Self-pay | Admitting: Ophthalmology

## 2015-05-28 ENCOUNTER — Other Ambulatory Visit
Admission: RE | Admit: 2015-05-28 | Discharge: 2015-05-28 | Disposition: A | Discharge status: Home / Self Care | Payer: Medicare Other | Source: Ambulatory Visit | Attending: Ophthalmology | Admitting: Ophthalmology

## 2015-05-28 DIAGNOSIS — H04302 Unspecified dacryocystitis of left lacrimal passage: Secondary | ICD-10-CM | POA: Diagnosis present

## 2015-05-31 LAB — WOUND CULTURE

## 2015-06-01 LAB — ANAEROBIC CULTURE

## 2018-12-18 ENCOUNTER — Emergency Department: Payer: Medicare Other

## 2018-12-18 ENCOUNTER — Emergency Department
Admission: EM | Admit: 2018-12-18 | Discharge: 2018-12-18 | Disposition: A | Discharge status: Home / Self Care | Payer: Medicare Other | Attending: Emergency Medicine | Admitting: Emergency Medicine

## 2018-12-18 ENCOUNTER — Other Ambulatory Visit: Payer: Self-pay

## 2018-12-18 DIAGNOSIS — I10 Essential (primary) hypertension: Secondary | ICD-10-CM | POA: Insufficient documentation

## 2018-12-18 DIAGNOSIS — S52124A Nondisplaced fracture of head of right radius, initial encounter for closed fracture: Secondary | ICD-10-CM | POA: Diagnosis not present

## 2018-12-18 DIAGNOSIS — S0990XA Unspecified injury of head, initial encounter: Secondary | ICD-10-CM

## 2018-12-18 DIAGNOSIS — Y999 Unspecified external cause status: Secondary | ICD-10-CM | POA: Diagnosis not present

## 2018-12-18 DIAGNOSIS — W19XXXA Unspecified fall, initial encounter: Secondary | ICD-10-CM | POA: Insufficient documentation

## 2018-12-18 DIAGNOSIS — Z79899 Other long term (current) drug therapy: Secondary | ICD-10-CM | POA: Insufficient documentation

## 2018-12-18 DIAGNOSIS — Y939 Activity, unspecified: Secondary | ICD-10-CM | POA: Diagnosis not present

## 2018-12-18 DIAGNOSIS — Z87891 Personal history of nicotine dependence: Secondary | ICD-10-CM | POA: Insufficient documentation

## 2018-12-18 DIAGNOSIS — S52125A Nondisplaced fracture of head of left radius, initial encounter for closed fracture: Secondary | ICD-10-CM

## 2018-12-18 DIAGNOSIS — Y929 Unspecified place or not applicable: Secondary | ICD-10-CM | POA: Insufficient documentation

## 2018-12-18 MED ORDER — BACITRACIN ZINC 500 UNIT/GM EX OINT
TOPICAL_OINTMENT | Freq: Once | CUTANEOUS | Status: AC
  Administered 2018-12-18: 1 via TOPICAL
  Filled 2018-12-18: qty 2.7

## 2018-12-18 MED ORDER — TRAMADOL HCL 50 MG PO TABS: 50.0000 mg | ORAL_TABLET | Freq: Four times a day (QID) | ORAL | 0 refills | Status: DC | PRN

## 2018-12-18 NOTE — ED Triage Notes (Signed)
Pt from home. Pt fell and hit left wrist and head. Left wrist and thumb swollen. Back of head with healing scabs that have reopened. Pt denies LOC.

## 2018-12-18 NOTE — ED Notes (Signed)
Skin cleaned and Dressings applied to skin tears on head, left upper arm, left elbow and lower arm, and right elbow. Bacitracin applied.

## 2018-12-18 NOTE — ED Provider Notes (Signed)
[facility] Emergency Department Provider Note   ____________________________________________    I have reviewed the triage vital signs and the nursing notes.   HISTORY  Chief Complaint Fall     HPI Jacob Moses is a 85 y.o. male who presents after a fall.  Patient has a history of fall, reports he lost his balance and fell over, complains of abrasions to his left elbow and arm as well as some pain in the left wrist.  He reports he did hit his head as well.  No LOC.  Not on blood thinners.  Overall feels well currently no abdominal pain chest pain palpitations or lower extremity injuries.  No back pain  Past Medical History:  Diagnosis Date  . H/O vertigo   . History of stomach ulcers   . HOH (hard of hearing)   . Hypertension   . Wears dentures     There are no active problems to display for this patient.   Past Surgical History:  Procedure Laterality Date  . [place]  . [place]  2010  . ECTROPION REPAIR Bilateral 04/24/2015   Procedure: REPAIR OF ECTROPION TARSAL WEDGE LEFT EYE REPAIR EXTENSIVE BILATERAL;  Surgeon: Karle Starch, MD;  Location: [facility];  Service: Ophthalmology;  Laterality: Bilateral;  . FRACTURE SURGERY Left    arm  . [place]  . LACRIMAL DUCT EXPLORATION Bilateral 04/24/2015   Procedure: LACRIMAL DUCT EXPLORATION PUNCTOPLASTY;  Surgeon: Karle Starch, MD;  Location: [place];  Service: Ophthalmology;  Laterality: Bilateral;  . SEPTOPLASTY    . STOMACH SURGERY  1966   ulcer    Prior to Admission medications   Medication Sig Start Date End Date Taking? Authorizing Provider  ALPRAZolam Duanne Moron) 1 MG tablet Take 1 mg by mouth at bedtime.    Yes [provider]  gabapentin (NEURONTIN) 300 MG capsule Take 300 mg by mouth at bedtime. 12/17/18 12/17/19 Yes [provider]  lisinopril (PRINIVIL,ZESTRIL) 20 MG tablet Take 20  mg by mouth 2 (two) times daily.   Yes [provider]  traMADol (ULTRAM) 50 MG tablet Take 1 tablet (50 mg total) by mouth every 6 (six) hours as needed. 12/18/18 12/18/19  Lavonia Drafts, MD     Allergies Patient has no known allergies.  Family History  Problem Relation Age of Onset  . Diabetes Father   . Cancer Mother     Social History Social History   Tobacco Use  . Smoking status: Former Smoker    Years: 30.00    Quit date: 05/05/1978    Years since quitting: 40.6  . Smokeless tobacco: Never Used  Substance Use Topics  . Alcohol use: No  . Drug use: No    Review of Systems  Constitutional: No fever/chills Eyes: No visual changes.  ENT: No sore throat. Cardiovascular: No chest wall pain Respiratory: Denies shortness of breath. Gastrointestinal: No abdominal pain.   Genitourinary: Negative for dysuria. Musculoskeletal: Left wrist pain Skin: As above Neurological: Negative for headaches or weakness   ____________________________________________   PHYSICAL EXAM:  VITAL SIGNS: ED Triage Vitals  Enc Vitals Group     BP 12/18/18 1156 (!) 148/74     Pulse Rate 12/18/18 1156 (!) 56     Resp 12/18/18 1156 19     Temp 12/18/18 1156 97.7 F (36.5 C)     Temp Source 12/18/18 1156 Oral  SpO2 12/18/18 1156 99 %     Weight 12/18/18 1158 65.8 kg (145 lb)     Height 12/18/18 1158 1.829 m (6')     Head Circumference --      Peak Flow --      Pain Score 12/18/18 1157 6     Pain Loc --      Pain Edu? --      Excl. in [place]? --     Constitutional: Alert and oriented.  No acute distress, hard of hearing  Head: Small hematoma to the posterior scalp, crown Nose: No congestion/rhinnorhea. Mouth/Throat: Mucous membranes are moist.   Neck:  Painless ROM, no vertebral has palpation, no pain with axial load Cardiovascular: Normal rate, Grossly normal heart sounds.  Good peripheral circulation.  No chest wall tenderness palpation Respiratory: Normal respiratory  effort.  No retractions. Gastrointestinal: Soft and nontender. No distention.  No CVA tenderness.  Musculoskeletal: Left wrist: Significant swelling along the lateral aspect and dorsal aspect, normal cap refill.  Otherwise extremity exam is notable for several small skin tears along the lateral left arm/elbow otherwise normal exam Neurologic:  Normal speech and language. No gross focal neurologic deficits are appreciated.  Skin:  Skin is warm, dry. Psychiatric: Mood and affect are normal. Speech and behavior are normal.  ____________________________________________   LABS (all labs ordered are listed, but only abnormal results are displayed)  Labs Reviewed - No data to display ____________________________________________  EKG  ED ECG REPORT I, Lavonia Drafts, the attending physician, personally viewed and interpreted this ECG.   Rhythm: A. fib QRS Axis: normal Intervals: Abnormal ST/T Wave abnormalities: Nonspecific changes Narrative Interpretation: no evidence of acute ischemia  ____________________________________________  RADIOLOGY  CT head unremarkable Wrist x-ray demonstrates comminuted left radial head fracture, viewed by me ____________________________________________   PROCEDURES  Procedure(s) performed: No  Procedures   Critical Care performed: No ____________________________________________   INITIAL IMPRESSION / ASSESSMENT AND PLAN / ED COURSE  Pertinent labs & imaging results that were available during my care of the patient were reviewed by me and considered in my medical decision making (see chart for details).  Patient with reassuring imaging after mechanical fall.  Eitel signs overall reassuring, exam consistent with skin tears to the left arm as well as likely wrist fracture, pending x-ray.  CT head unremarkable  X-ray consistent with radial head fracture, splinted will follow-up with orthopedics, recommend rice     ____________________________________________   FINAL CLINICAL IMPRESSION(S) / ED DIAGNOSES  Final diagnoses:  Fall, initial encounter  Injury of head, initial encounter  Closed nondisplaced fracture of head of left radius, initial encounter        Note:  This document was prepared using Dragon voice recognition software and may include unintentional dictation errors.   Lavonia Drafts, MD 12/18/18 1500

## 2018-12-18 NOTE — ED Notes (Signed)
Patient transported to CT 

## 2018-12-28 ENCOUNTER — Other Ambulatory Visit: Payer: Self-pay

## 2018-12-28 ENCOUNTER — Encounter
Admission: RE | Admit: 2018-12-28 | Discharge: 2018-12-28 | Disposition: A | Discharge status: Home / Self Care | Payer: Medicare Other | Source: Ambulatory Visit | Attending: Orthopedic Surgery | Admitting: Orthopedic Surgery

## 2018-12-28 DIAGNOSIS — I10 Essential (primary) hypertension: Secondary | ICD-10-CM | POA: Insufficient documentation

## 2018-12-28 DIAGNOSIS — Z01818 Encounter for other preprocedural examination: Secondary | ICD-10-CM | POA: Insufficient documentation

## 2018-12-28 DIAGNOSIS — I4891 Unspecified atrial fibrillation: Secondary | ICD-10-CM | POA: Insufficient documentation

## 2018-12-28 DIAGNOSIS — X58XXXA Exposure to other specified factors, initial encounter: Secondary | ICD-10-CM | POA: Diagnosis not present

## 2018-12-28 DIAGNOSIS — S52502A Unspecified fracture of the lower end of left radius, initial encounter for closed fracture: Secondary | ICD-10-CM | POA: Diagnosis not present

## 2018-12-28 DIAGNOSIS — Z5309 Procedure and treatment not carried out because of other contraindication: Secondary | ICD-10-CM | POA: Diagnosis not present

## 2018-12-28 DIAGNOSIS — E875 Hyperkalemia: Secondary | ICD-10-CM | POA: Diagnosis not present

## 2018-12-28 DIAGNOSIS — Z20828 Contact with and (suspected) exposure to other viral communicable diseases: Secondary | ICD-10-CM | POA: Insufficient documentation

## 2018-12-28 HISTORY — DX: Insomnia, unspecified: G47.00

## 2018-12-28 LAB — CBC
HCT: 37.8 % — ABNORMAL LOW (ref 39.0–52.0)
Hemoglobin: 11.4 g/dL — ABNORMAL LOW (ref 13.0–17.0)
MCH: 30.9 pg (ref 26.0–34.0)
MCHC: 30.2 g/dL (ref 30.0–36.0)
MCV: 102.4 fL — ABNORMAL HIGH (ref 80.0–100.0)
Platelets: 359 10*3/uL (ref 150–400)
RBC: 3.69 MIL/uL — ABNORMAL LOW (ref 4.22–5.81)
RDW: 13.2 % (ref 11.5–15.5)
WBC: 12.1 10*3/uL — ABNORMAL HIGH (ref 4.0–10.5)
nRBC: 0 % (ref 0.0–0.2)

## 2018-12-28 LAB — BASIC METABOLIC PANEL
Anion gap: 10 (ref 5–15)
BUN: 44 mg/dL — ABNORMAL HIGH (ref 8–23)
CO2: 20 mmol/L — ABNORMAL LOW (ref 22–32)
Calcium: 8.9 mg/dL (ref 8.9–10.3)
Chloride: 108 mmol/L (ref 98–111)
Creatinine, Ser: 1.87 mg/dL — ABNORMAL HIGH (ref 0.61–1.24)
GFR calc Af Amer: 36 mL/min — ABNORMAL LOW (ref >60)
GFR calc non Af Amer: 31 mL/min — ABNORMAL LOW (ref >60)
Glucose, Bld: 86 mg/dL (ref 70–99)
Potassium: 6.4 mmol/L (ref 3.5–5.1)
Sodium: 138 mmol/L (ref 135–145)

## 2018-12-28 LAB — SARS CORONAVIRUS 2 (TAT 6-24 HRS): SARS Coronavirus 2: NEGATIVE

## 2018-12-28 NOTE — Pre-Procedure Instructions (Signed)
Notified Kacie at [facility] ortho that patient's K was 6.4 today.  She arranged for Mr. Bellus to be seen at [facility] cardiology tomorrow at 11:00 and will notify them to review abnormal labs.

## 2018-12-28 NOTE — Pre-Procedure Instructions (Signed)
Sent request for cardiology clearance to [facility] ortho and [facility] cardiology.

## 2018-12-28 NOTE — Patient Instructions (Addendum)
Your procedure is scheduled on: Thurs 8/27 Report to Day Surgery. To find out your arrival time please call [phone] between [place] on [address]. 8/26.  Remember: Instructions that are not followed completely may result in serious medical risk,  up to and including death, or upon the discretion of your surgeon and anesthesiologist your  surgery may need to be rescheduled.     _X__ 1. Do not eat food after midnight the night before your procedure.                 No gum chewing or hard candies. You may drink clear liquids up to 2 hours                 before you are scheduled to arrive for your surgery- DO not drink clear                 liquids within 2 hours of the start of your surgery.                 Clear Liquids include:  water, apple juice without pulp, clear carbohydrate                 drink such as Clearfast of Gatorade, Black Coffee or Tea (Do not add                 anything to coffee or tea).  __X__2.  On the morning of surgery brush your teeth with toothpaste and water, you                may rinse your mouth with mouthwash if you wish.  Do not swallow any toothpaste of mouthwash.     ___ 3.  No Alcohol for 24 hours before or after surgery.   ___ 4.  Do Not Smoke or use e-cigarettes For 24 Hours Prior to Your Surgery.                 Do not use any chewable tobacco products for at least 6 hours prior to                 surgery.  ____  5.  Bring all medications with you on the day of surgery if instructed.   __x_  6.  Notify your doctor if there is any change in your medical condition      (cold, fever, infections).     Do not wear jewelry, make-up, hairpins, clips or nail polish. Do not wear lotions, powders, or perfumes. You may wear deodorant. Do not shave 48 hours prior to surgery. Men may shave face and neck. Do not bring valuables to the hospital.    [facility] is not responsible for any belongings or valuables.  Contacts, dentures or  bridgework may not be worn into surgery. Leave your suitcase in the car. After surgery it may be brought to your room. For patients admitted to the hospital, discharge time is determined by your treatment team.   Patients discharged the day of surgery will not be allowed to drive home.   Please read over the following fact sheets that you were given:    __x__ Take these medicines the morning of surgery with A SIP OF WATER:    1.   2. traMADol (ULTRAM) 50 MG tablet if needed  3.   4.  5.  6.  ____ Fleet Enema (as directed)   _x__ Use CHG Sage wipes as directed  ____ Use inhalers on the day of surgery  ____ Stop metformin 2 days prior to surgery    ____ Take 1/2 of usual insulin dose the night before surgery. No insulin the morning          of surgery.   ____ Stop Coumadin/Plavix/aspirin on   ____ Stop Anti-inflammatories    ____ Stop supplements until after surgery.    ____ Bring C-Pap to the hospital.

## 2018-12-28 NOTE — Pre-Procedure Instructions (Signed)
Tim, Mr Comella's son in -law notified of appointment with cardiology.

## 2018-12-28 NOTE — Pre-Procedure Instructions (Signed)
Dr. Juanell Fairly office notified of K of 6.4 and EKG sent for evaluation.

## 2018-12-29 MED ORDER — CEFAZOLIN SODIUM-DEXTROSE 2-4 GM/100ML-% IV SOLN: 2.0000 g | Freq: Once | INTRAVENOUS | Status: DC

## 2018-12-29 NOTE — Pre-Procedure Instructions (Addendum)
Patient saw Cardiologist at [facility] today for clearance. I called the office to request the clearance be faxed to [phone] at 1500 today. Awaiting fax.   I called [facility] and requested they fax the clearance to [facility] [phone]. Fax will be sent after 1630 to Same Day.

## 2018-12-30 ENCOUNTER — Encounter: Payer: Self-pay | Admitting: *Deleted

## 2018-12-30 ENCOUNTER — Other Ambulatory Visit: Payer: Self-pay

## 2018-12-30 ENCOUNTER — Encounter: Payer: Self-pay | Admitting: Certified Registered Nurse Anesthetist

## 2018-12-30 ENCOUNTER — Encounter
Admission: RE | Disposition: A | Discharge status: Home / Self Care | Payer: Self-pay | Source: Home / Self Care | Attending: Orthopedic Surgery

## 2018-12-30 ENCOUNTER — Ambulatory Visit
Admission: RE | Admit: 2018-12-30 | Discharge: 2018-12-30 | Disposition: A | Discharge status: Home / Self Care | Payer: Medicare Other | Attending: Orthopedic Surgery | Admitting: Orthopedic Surgery

## 2018-12-30 DIAGNOSIS — E875 Hyperkalemia: Secondary | ICD-10-CM | POA: Insufficient documentation

## 2018-12-30 DIAGNOSIS — Z20828 Contact with and (suspected) exposure to other viral communicable diseases: Secondary | ICD-10-CM | POA: Insufficient documentation

## 2018-12-30 DIAGNOSIS — Z5309 Procedure and treatment not carried out because of other contraindication: Secondary | ICD-10-CM | POA: Insufficient documentation

## 2018-12-30 DIAGNOSIS — I10 Essential (primary) hypertension: Secondary | ICD-10-CM | POA: Insufficient documentation

## 2018-12-30 DIAGNOSIS — S52502A Unspecified fracture of the lower end of left radius, initial encounter for closed fracture: Secondary | ICD-10-CM | POA: Diagnosis not present

## 2018-12-30 DIAGNOSIS — X58XXXA Exposure to other specified factors, initial encounter: Secondary | ICD-10-CM | POA: Insufficient documentation

## 2018-12-30 LAB — POTASSIUM: Potassium: 5.7 mmol/L — ABNORMAL HIGH (ref 3.5–5.1)

## 2018-12-30 SURGERY — OPEN REDUCTION INTERNAL FIXATION (ORIF) DISTAL RADIUS FRACTURE
Anesthesia: Choice | Laterality: Left

## 2018-12-30 MED ORDER — LACTATED RINGERS IV SOLN
INTRAVENOUS | Status: DC
  Administered 2018-12-30: 11:00:00 via INTRAVENOUS

## 2018-12-30 MED ORDER — PROPOFOL 10 MG/ML IV BOLUS
INTRAVENOUS | Status: AC
  Filled 2018-12-30: qty 40

## 2018-12-30 MED ORDER — FAMOTIDINE 20 MG PO TABS: 20.0000 mg | ORAL_TABLET | Freq: Once | ORAL | Status: DC

## 2018-12-30 SURGICAL SUPPLY — 30 items
BNDG ELASTIC 4X5.8 VLCR STR LF (GAUZE/BANDAGES/DRESSINGS) ×2 IMPLANT
CANISTER SUCT 1200ML W/VALVE (MISCELLANEOUS) ×2 IMPLANT
CHLORAPREP W/TINT 26 (MISCELLANEOUS) ×2 IMPLANT
COVER WAND RF STERILE (DRAPES) ×2 IMPLANT
CUFF TOURN SGL QUICK 18X4 (TOURNIQUET CUFF) IMPLANT
DRAPE FLUOR MINI C-ARM 54X84 (DRAPES) ×2 IMPLANT
ELECT REM PT RETURN 9FT ADLT (ELECTROSURGICAL) ×2
ELECTRODE REM PT RTRN 9FT ADLT (ELECTROSURGICAL) ×1 IMPLANT
GAUZE SPONGE 4X4 12PLY STRL (GAUZE/BANDAGES/DRESSINGS) ×2 IMPLANT
GAUZE XEROFORM 1X8 LF (GAUZE/BANDAGES/DRESSINGS) ×4 IMPLANT
GLOVE SURG SYN 9.0  PF PI (GLOVE) ×1
GLOVE SURG SYN 9.0 PF PI (GLOVE) ×1 IMPLANT
GOWN SRG 2XL LVL 4 RGLN SLV (GOWNS) ×1 IMPLANT
GOWN STRL NON-REIN 2XL LVL4 (GOWNS) ×1
GOWN STRL REUS W/ TWL LRG LVL3 (GOWN DISPOSABLE) ×1 IMPLANT
GOWN STRL REUS W/TWL LRG LVL3 (GOWN DISPOSABLE) ×1
KIT TURNOVER KIT A (KITS) ×2 IMPLANT
NEEDLE FILTER BLUNT 18X 1/2SAF (NEEDLE) ×1
NEEDLE FILTER BLUNT 18X1 1/2 (NEEDLE) ×1 IMPLANT
NS IRRIG 500ML POUR BTL (IV SOLUTION) ×2 IMPLANT
PACK EXTREMITY ARMC (MISCELLANEOUS) ×2 IMPLANT
PAD CAST CTTN 4X4 STRL (SOFTGOODS) ×2 IMPLANT
PADDING CAST COTTON 4X4 STRL (SOFTGOODS) ×2
SCALPEL PROTECTED #15 DISP (BLADE) ×4 IMPLANT
SPLINT CAST 1 STEP 3X12 (MISCELLANEOUS) ×2 IMPLANT
SUT ETHILON 4-0 (SUTURE) ×1
SUT ETHILON 4-0 FS2 18XMFL BLK (SUTURE) ×1
SUT VICRYL 3-0 27IN (SUTURE) ×2 IMPLANT
SUTURE ETHLN 4-0 FS2 18XMF BLK (SUTURE) ×1 IMPLANT
SYR 3ML LL SCALE MARK (SYRINGE) ×2 IMPLANT

## 2018-12-30 NOTE — Anesthesia Preprocedure Evaluation (Deleted)
Anesthesia Evaluation  Patient identified by MRN, date of birth, ID band Patient awake    Reviewed: Allergy & Precautions, H&P , NPO status , Patient's Chart, lab work & pertinent test results  Airway Mallampati: II  TM Distance: >3 FB     Dental  (+) Edentulous Upper, Edentulous Lower   Pulmonary neg pulmonary ROS, neg shortness of breath, neg COPD, neg recent URI, former smoker,           Cardiovascular hypertension, (-) angina(-) Cardiac Stents + dysrhythmias (RBBB, PVC's)      Neuro/Psych negative neurological ROS  negative psych ROS   GI/Hepatic negative GI ROS, Neg liver ROS,   Endo/Other  negative endocrine ROS  Renal/GU      Musculoskeletal   Abdominal   Peds  Hematology negative hematology ROS (+)   Anesthesia Other Findings Past Medical History: No date: H/O vertigo No date: History of stomach ulcers No date: HOH (hard of hearing) No date: Hypertension No date: Insomnia No date: Wears dentures  Past Surgical History: 1999: APPENDECTOMY     Comment:  Dr.Byrnett 2010: [place] 04/24/2015: ECTROPION REPAIR; Bilateral     Comment:  Procedure: REPAIR OF ECTROPION TARSAL WEDGE LEFT EYE               REPAIR EXTENSIVE BILATERAL;  Surgeon: Karle Starch, MD;                Location: [place];  Service: Ophthalmology;                Laterality: Bilateral; No date: FRACTURE SURGERY; Left     Comment:  arm 1999: HERNIA REPAIR     Comment:  Dr.Byrnett 04/24/2015: LACRIMAL DUCT EXPLORATION; Bilateral     Comment:  Procedure: LACRIMAL DUCT EXPLORATION PUNCTOPLASTY;                Surgeon: Karle Starch, MD;  Location: [place];  Service: Ophthalmology;  Laterality: Bilateral; No date: SEPTOPLASTY 1966: STOMACH SURGERY     Comment:  ulcer     Reproductive/Obstetrics negative OB ROS                             Anesthesia  Physical Anesthesia Plan  ASA: II  Anesthesia Plan: General LMA   Post-op Pain Management:    Induction:   PONV Risk Score and Plan: Dexamethasone, Ondansetron and Treatment may vary due to age or medical condition  Airway Management Planned:   Additional Equipment:   Intra-op Plan:   Post-operative Plan:   Informed Consent: I have reviewed the patients History and Physical, chart, labs and discussed the procedure including the risks, benefits and alternatives for the proposed anesthesia with the patient or authorized representative who has indicated his/her understanding and acceptance.     Dental Advisory Given  Plan Discussed with: Anesthesiologist and CRNA  Anesthesia Plan Comments:         Anesthesia Quick Evaluation

## 2018-12-30 NOTE — H&P (Signed)
Reviewed paper H+P, will be scanned into chart. No changes noted.  

## 2018-12-30 NOTE — Progress Notes (Signed)
Procedure to repair forearm canceled by anesthesia due to a K+ level of 5.7. Patient will follow up with primary care physician to determine the reason for high K+ reading.

## 2019-01-03 LAB — POCT I-STAT 4, (NA,K, GLUC, HGB,HCT)
Glucose, Bld: 89 mg/dL (ref 70–99)
HCT: 34 % — ABNORMAL LOW (ref 39.0–52.0)
Hemoglobin: 11.6 g/dL — ABNORMAL LOW (ref 13.0–17.0)
Potassium: 8.3 mmol/L (ref 3.5–5.1)
Sodium: 127 mmol/L — ABNORMAL LOW (ref 135–145)

## 2019-02-22 ENCOUNTER — Inpatient Hospital Stay
Admission: EM | Admit: 2019-02-22 | Discharge: 2019-02-25 | DRG: 085 | Disposition: A | Discharge status: Skilled Nursing Facility | Payer: Medicare Other | Attending: Internal Medicine | Admitting: Internal Medicine

## 2019-02-22 ENCOUNTER — Emergency Department: Payer: Medicare Other

## 2019-02-22 ENCOUNTER — Other Ambulatory Visit: Payer: Self-pay

## 2019-02-22 ENCOUNTER — Encounter: Payer: Self-pay | Admitting: Emergency Medicine

## 2019-02-22 DIAGNOSIS — E860 Dehydration: Secondary | ICD-10-CM | POA: Diagnosis present

## 2019-02-22 DIAGNOSIS — Z87891 Personal history of nicotine dependence: Secondary | ICD-10-CM

## 2019-02-22 DIAGNOSIS — W1830XA Fall on same level, unspecified, initial encounter: Secondary | ICD-10-CM | POA: Diagnosis present

## 2019-02-22 DIAGNOSIS — H9190 Unspecified hearing loss, unspecified ear: Secondary | ICD-10-CM | POA: Diagnosis present

## 2019-02-22 DIAGNOSIS — Z833 Family history of diabetes mellitus: Secondary | ICD-10-CM

## 2019-02-22 DIAGNOSIS — S62102A Fracture of unspecified carpal bone, left wrist, initial encounter for closed fracture: Secondary | ICD-10-CM | POA: Diagnosis present

## 2019-02-22 DIAGNOSIS — Z66 Do not resuscitate: Secondary | ICD-10-CM | POA: Diagnosis present

## 2019-02-22 DIAGNOSIS — E875 Hyperkalemia: Secondary | ICD-10-CM | POA: Diagnosis present

## 2019-02-22 DIAGNOSIS — Z20828 Contact with and (suspected) exposure to other viral communicable diseases: Secondary | ICD-10-CM | POA: Diagnosis present

## 2019-02-22 DIAGNOSIS — G4700 Insomnia, unspecified: Secondary | ICD-10-CM | POA: Diagnosis present

## 2019-02-22 DIAGNOSIS — S065XAA Traumatic subdural hemorrhage with loss of consciousness status unknown, initial encounter: Secondary | ICD-10-CM | POA: Diagnosis present

## 2019-02-22 DIAGNOSIS — Z8711 Personal history of peptic ulcer disease: Secondary | ICD-10-CM

## 2019-02-22 DIAGNOSIS — Z681 Body mass index (BMI) 19 or less, adult: Secondary | ICD-10-CM

## 2019-02-22 DIAGNOSIS — M6282 Rhabdomyolysis: Secondary | ICD-10-CM | POA: Diagnosis present

## 2019-02-22 DIAGNOSIS — E43 Unspecified severe protein-calorie malnutrition: Secondary | ICD-10-CM | POA: Diagnosis present

## 2019-02-22 DIAGNOSIS — I10 Essential (primary) hypertension: Secondary | ICD-10-CM | POA: Diagnosis present

## 2019-02-22 DIAGNOSIS — S065X0A Traumatic subdural hemorrhage without loss of consciousness, initial encounter: Secondary | ICD-10-CM | POA: Diagnosis present

## 2019-02-22 DIAGNOSIS — W19XXXA Unspecified fall, initial encounter: Secondary | ICD-10-CM

## 2019-02-22 DIAGNOSIS — S065X9A Traumatic subdural hemorrhage with loss of consciousness of unspecified duration, initial encounter: Secondary | ICD-10-CM | POA: Diagnosis present

## 2019-02-22 DIAGNOSIS — Z809 Family history of malignant neoplasm, unspecified: Secondary | ICD-10-CM | POA: Diagnosis not present

## 2019-02-22 LAB — CBC WITH DIFFERENTIAL/PLATELET
Abs Immature Granulocytes: 0.09 10*3/uL — ABNORMAL HIGH (ref 0.00–0.07)
Basophils Absolute: 0 10*3/uL (ref 0.0–0.1)
Basophils Relative: 0 %
Eosinophils Absolute: 0 10*3/uL (ref 0.0–0.5)
Eosinophils Relative: 0 %
HCT: 39 % (ref 39.0–52.0)
Hemoglobin: 12.6 g/dL — ABNORMAL LOW (ref 13.0–17.0)
Immature Granulocytes: 1 %
Lymphocytes Relative: 4 %
Lymphs Abs: 0.6 10*3/uL — ABNORMAL LOW (ref 0.7–4.0)
MCH: 30.5 pg (ref 26.0–34.0)
MCHC: 32.3 g/dL (ref 30.0–36.0)
MCV: 94.4 fL (ref 80.0–100.0)
Monocytes Absolute: 1.4 10*3/uL — ABNORMAL HIGH (ref 0.1–1.0)
Monocytes Relative: 8 %
Neutro Abs: 14.5 10*3/uL — ABNORMAL HIGH (ref 1.7–7.7)
Neutrophils Relative %: 87 %
Platelets: 310 10*3/uL (ref 150–400)
RBC: 4.13 MIL/uL — ABNORMAL LOW (ref 4.22–5.81)
RDW: 13.3 % (ref 11.5–15.5)
WBC: 16.6 10*3/uL — ABNORMAL HIGH (ref 4.0–10.5)
nRBC: 0 % (ref 0.0–0.2)

## 2019-02-22 LAB — URINALYSIS, COMPLETE (UACMP) WITH MICROSCOPIC
Bacteria, UA: NONE SEEN
Bilirubin Urine: NEGATIVE
Glucose, UA: NEGATIVE mg/dL
Ketones, ur: 5 mg/dL — AB
Leukocytes,Ua: NEGATIVE
Nitrite: NEGATIVE
Protein, ur: 100 mg/dL — AB
Specific Gravity, Urine: 1.019 (ref 1.005–1.030)
pH: 5 (ref 5.0–8.0)

## 2019-02-22 LAB — COMPREHENSIVE METABOLIC PANEL
ALT: 28 U/L (ref 0–44)
AST: 103 U/L — ABNORMAL HIGH (ref 15–41)
Albumin: 4.2 g/dL (ref 3.5–5.0)
Alkaline Phosphatase: 90 U/L (ref 38–126)
Anion gap: 11 (ref 5–15)
BUN: 30 mg/dL — ABNORMAL HIGH (ref 8–23)
CO2: 23 mmol/L (ref 22–32)
Calcium: 9.5 mg/dL (ref 8.9–10.3)
Chloride: 105 mmol/L (ref 98–111)
Creatinine, Ser: 1.18 mg/dL (ref 0.61–1.24)
GFR calc Af Amer: >60 mL/min (ref >60)
GFR calc non Af Amer: 54 mL/min — ABNORMAL LOW (ref >60)
Glucose, Bld: 108 mg/dL — ABNORMAL HIGH (ref 70–99)
Potassium: 5.3 mmol/L — ABNORMAL HIGH (ref 3.5–5.1)
Sodium: 139 mmol/L (ref 135–145)
Total Bilirubin: 1.1 mg/dL (ref 0.3–1.2)
Total Protein: 7.5 g/dL (ref 6.5–8.1)

## 2019-02-22 LAB — MRSA PCR SCREENING: MRSA by PCR: NEGATIVE

## 2019-02-22 LAB — GLUCOSE, CAPILLARY: Glucose-Capillary: 144 mg/dL — ABNORMAL HIGH (ref 70–99)

## 2019-02-22 LAB — SARS CORONAVIRUS 2 BY RT PCR (HOSPITAL ORDER, PERFORMED IN ~~LOC~~ HOSPITAL LAB): SARS Coronavirus 2: NEGATIVE

## 2019-02-22 MED ORDER — CHLORHEXIDINE GLUCONATE CLOTH 2 % EX PADS
6.0000 | MEDICATED_PAD | Freq: Every day | CUTANEOUS | Status: DC
  Administered 2019-02-22: 6 via TOPICAL

## 2019-02-22 MED ORDER — LABETALOL HCL 5 MG/ML IV SOLN: 20.0000 mg | INTRAVENOUS | Status: DC | PRN

## 2019-02-22 MED ORDER — GABAPENTIN 100 MG PO CAPS
100.0000 mg | ORAL_CAPSULE | Freq: Every day | ORAL | Status: DC
  Administered 2019-02-22: 100 mg via ORAL
  Filled 2019-02-22: qty 1

## 2019-02-22 MED ORDER — LORATADINE 10 MG PO TABS
10.0000 mg | ORAL_TABLET | Freq: Every day | ORAL | Status: DC
  Administered 2019-02-23 – 2019-02-25 (×3): 10 mg via ORAL
  Filled 2019-02-22 (×3): qty 1

## 2019-02-22 MED ORDER — TRAZODONE HCL 50 MG PO TABS
25.0000 mg | ORAL_TABLET | Freq: Every evening | ORAL | Status: DC | PRN
  Administered 2019-02-25: 25 mg via ORAL
  Filled 2019-02-22: qty 1

## 2019-02-22 MED ORDER — MAGNESIUM HYDROXIDE 400 MG/5ML PO SUSP
30.0000 mL | Freq: Every day | ORAL | Status: DC | PRN
  Filled 2019-02-22: qty 30

## 2019-02-22 MED ORDER — ALPRAZOLAM 0.5 MG PO TABS
1.0000 mg | ORAL_TABLET | Freq: Every day | ORAL | Status: DC
  Administered 2019-02-22: 1 mg via ORAL
  Filled 2019-02-22: qty 2

## 2019-02-22 MED ORDER — ACETAMINOPHEN 500 MG PO TABS
1000.0000 mg | ORAL_TABLET | Freq: Once | ORAL | Status: AC
  Administered 2019-02-22: 1000 mg via ORAL
  Filled 2019-02-22: qty 2

## 2019-02-22 MED ORDER — ONDANSETRON HCL 4 MG/2ML IJ SOLN: 4.0000 mg | Freq: Four times a day (QID) | INTRAMUSCULAR | Status: DC | PRN

## 2019-02-22 MED ORDER — ACETAMINOPHEN 325 MG PO TABS
650.0000 mg | ORAL_TABLET | Freq: Four times a day (QID) | ORAL | Status: DC | PRN
  Administered 2019-02-24 – 2019-02-25 (×5): 650 mg via ORAL
  Filled 2019-02-22 (×5): qty 2

## 2019-02-22 MED ORDER — ONDANSETRON HCL 4 MG PO TABS: 4.0000 mg | ORAL_TABLET | Freq: Four times a day (QID) | ORAL | Status: DC | PRN

## 2019-02-22 MED ORDER — ACETAMINOPHEN 650 MG RE SUPP: 650.0000 mg | Freq: Four times a day (QID) | RECTAL | Status: DC | PRN

## 2019-02-22 MED ORDER — SODIUM CHLORIDE 0.9 % IV SOLN
INTRAVENOUS | Status: DC
  Administered 2019-02-22 – 2019-02-24 (×4): via INTRAVENOUS

## 2019-02-22 NOTE — ED Triage Notes (Signed)
Pt in via [facility] from home, reports mechanical fall last night but was too weak to get up, laying in floor all night.  Unable to tell if he hit is head, denies LOC, denies blood thinners.  Skin tear noted to right neck, complaints of headache upon arrival.  EDP to bedside.

## 2019-02-22 NOTE — Progress Notes (Signed)
eLink Physician-Brief Progress Note Patient Name: Jacob Moses DOB: 1927/11/12 MRN: [id number]   Date of Service  02/22/2019  HPI/Events of Note  PT with bilateral sub-dural hematomas s/p fall, he is in the ICU for close neurological monitoring, not a candidadate for surgical intervention at this point, he is a DNR.  eICU Interventions  New patient evaluation completed.        Kerry Kass Ogan 02/22/2019, 9:19 PM

## 2019-02-22 NOTE — ED Notes (Signed)
Urine sent to lab 

## 2019-02-22 NOTE — Consult Note (Signed)
Neurosurgery-New Consultation Evaluation 02/22/2019 MOSES MEIER [id number]  Identifying Statement: PANTALEON VIGNALI is a 85 y.o. male from [place] [address] with subdural hematoma  Physician Requesting Consultation: [place] [facility] emergency department  History of Present Illness: Mr. Irigoyen is here with a history of a fall 2 months ago with a wrist fracture and a CT scan of the head at that time showing no acute abnormalities.  He returns today after being found at home by his daughter with a reported fall and multiple abrasions and contusions.  Repeat CT of the head today does show bilateral subdural hematomas with mass-effect.  There is no reported use of antiplatelet or anticoagulant therapy.  He has noticed some more difficulties with falls recently but denies any obvious focal changes.  He is hard of hearing at baseline.  The daughter does state that she feels like his words may be more slurred.  He does live alone and reportedly 2 months ago was walking over a mile per day.  Past Medical History:  Past Medical History:  Diagnosis Date  . H/O vertigo   . History of stomach ulcers   . HOH (hard of hearing)   . Hypertension   . Insomnia   . Wears dentures     Social History: Social History   Socioeconomic History  . Marital status: Widowed    Spouse name: Not on file  . Number of children: Not on file  . Years of education: Not on file  . Highest education level: Not on file  Occupational History  . Not on file  Social Needs  . Financial resource strain: Not on file  . Food insecurity    Worry: Not on file    Inability: Not on file  . Transportation needs    Medical: Not on file    Non-medical: Not on file  Tobacco Use  . Smoking status: Former Smoker    Years: 30.00    Quit date: 05/05/1978    Years since quitting: 40.8  . Smokeless tobacco: Never Used  Substance and Sexual Activity  . Alcohol use: No  . Drug use: No  . Sexual activity: Not on file   Lifestyle  . Physical activity    Days per week: Not on file    Minutes per session: Not on file  . Stress: Not on file  Relationships  . Social Herbalist on phone: Not on file    Gets together: Not on file    Attends religious service: Not on file    Active member of club or organization: Not on file    Attends meetings of clubs or organizations: Not on file    Relationship status: Not on file  . Intimate partner violence    Fear of current or ex partner: Not on file    Emotionally abused: Not on file    Physically abused: Not on file    Forced sexual activity: Not on file  Other Topics Concern  . Not on file  Social History Narrative  . Not on file   Living arrangements (living alone, with partner): Daughter is in room, patient states he would want her to help make medical decisions  Family History: Family History  Problem Relation Age of Onset  . Cancer Mother   . Diabetes Father     Review of Systems:  Review of Systems - General ROS: Negative Psychological ROS: Negative Ophthalmic ROS: Negative ENT ROS: Positive for hearing loss Hematological and  Lymphatic ROS: Negative  Endocrine ROS: Negative Respiratory ROS: Negative Cardiovascular ROS: Negative Gastrointestinal ROS: Negative Genito-Urinary ROS: Negative Musculoskeletal ROS: Negative Neurological ROS: Positive for falls Dermatological ROS: Negative  Physical Exam: BP (!) 162/84   Pulse 75   Temp 97.9 F (36.6 C) (Axillary)   Resp 14   Ht 5\' 11"  (1.803 m)   Wt 61.7 kg   SpO2 100%   BMI 18.97 kg/m  Body mass index is 18.97 kg/m. Body surface area is 1.76 meters squared. General appearance: Alert, cooperative, in no acute distress Head: Some abrasions noted Eyes: Normal, EOM intact Oropharynx: Moist without lesions Neck: Bandage over abrasion on the right side of the neck with obvious bleeding. Ext: Multiple contusions noted in upper extremities  Neurologic exam:  Mental status:  alertness: alert, orientation: person, place, time, affect: normal Speech: fluent and clear, names 4 out of 4 objects, some difficulty with repetition Cranial nerves:  II: Visual fields are full by confrontation, no ptosis III/IV/VI: extra-ocular motions intact bilaterally V/VII:no evidence of facial droop or weakness  VIII: hearing normal XI: trapezius strength symmetric,  sternocleidomastoid strength symmetric XII: tongue strength symmetric  Motor:strength symmetric 5/5 in bilateral upper and lower extremities Sensory: intact to light touch in all extremities Gait: Not tested, lying supine in bed  Laboratory: Results for orders placed or performed during the hospital encounter of 02/22/19  CBC with Differential  Result Value Ref Range   WBC 16.6 (H) 4.0 - 10.5 K/uL   RBC 4.13 (L) 4.22 - 5.81 MIL/uL   Hemoglobin 12.6 (L) 13.0 - 17.0 g/dL   HCT 39.0 39.0 - 52.0 %   MCV 94.4 80.0 - 100.0 fL   MCH 30.5 26.0 - 34.0 pg   MCHC 32.3 30.0 - 36.0 g/dL   RDW 13.3 11.5 - 15.5 %   Platelets 310 150 - 400 K/uL   nRBC 0.0 0.0 - 0.2 %   Neutrophils Relative % 87 %   Neutro Abs 14.5 (H) 1.7 - 7.7 K/uL   Lymphocytes Relative 4 %   Lymphs Abs 0.6 (L) 0.7 - 4.0 K/uL   Monocytes Relative 8 %   Monocytes Absolute 1.4 (H) 0.1 - 1.0 K/uL   Eosinophils Relative 0 %   Eosinophils Absolute 0.0 0.0 - 0.5 K/uL   Basophils Relative 0 %   Basophils Absolute 0.0 0.0 - 0.1 K/uL   Immature Granulocytes 1 %   Abs Immature Granulocytes 0.09 (H) 0.00 - 0.07 K/uL  Comprehensive metabolic panel  Result Value Ref Range   Sodium 139 135 - 145 mmol/L   Potassium 5.3 (H) 3.5 - 5.1 mmol/L   Chloride 105 98 - 111 mmol/L   CO2 23 22 - 32 mmol/L   Glucose, Bld 108 (H) 70 - 99 mg/dL   BUN 30 (H) 8 - 23 mg/dL   Creatinine, Ser 1.18 0.61 - 1.24 mg/dL   Calcium 9.5 8.9 - 10.3 mg/dL   Total Protein 7.5 6.5 - 8.1 g/dL   Albumin 4.2 3.5 - 5.0 g/dL   AST 103 (H) 15 - 41 U/L   ALT 28 0 - 44 U/L   Alkaline Phosphatase  90 38 - 126 U/L   Total Bilirubin 1.1 0.3 - 1.2 mg/dL   GFR calc non Af Amer 54 (L) >60 mL/min   GFR calc Af Amer >60 >60 mL/min   Anion gap 11 5 - 15  Urinalysis, Complete w Microscopic  Result Value Ref Range   Color, Urine YELLOW (A) YELLOW  APPearance HAZY (A) CLEAR   Specific Gravity, Urine 1.019 1.005 - 1.030   pH 5.0 5.0 - 8.0   Glucose, UA NEGATIVE NEGATIVE mg/dL   Hgb urine dipstick LARGE (A) NEGATIVE   Bilirubin Urine NEGATIVE NEGATIVE   Ketones, ur 5 (A) NEGATIVE mg/dL   Protein, ur 100 (A) NEGATIVE mg/dL   Nitrite NEGATIVE NEGATIVE   Leukocytes,Ua NEGATIVE NEGATIVE   RBC / HPF 0-5 0 - 5 RBC/hpf   WBC, UA 0-5 0 - 5 WBC/hpf   Bacteria, UA NONE SEEN NONE SEEN   Squamous Epithelial / LPF 0-5 0 - 5   Mucus PRESENT    Hyaline Casts, UA PRESENT    Granular Casts, UA PRESENT    I personally reviewed labs  Imaging: CT head:1. Bilateral convexity subdural hematomas which appear subacute. The subdural blood is predominately low density on CT however there are areas of higher density bilaterally. Largest hematoma the left measures 23 mm in thickness with 4 mm midline shift to the right. 2. Negative for cervical spine fracture   Impression/Plan:  Mr. Carrolyn Leigh is here for evaluation with reported recent falls and found after a large fall last night.  His CT scan does show a large left-sided subdural with a smaller right-sided subdural hematoma that appears more subacute to chronic in nature.  He does not have any known bleeding risk but I am concerned about the multiple falls causing these in the past.  His neurologic exam is reassuring, however he does have some difficulties with the balance and I do think from a safety standpoint, he should be evaluated by physical therapy and discussion of appropriate placement as he may not be able to take care of himself at home.  I did discuss with him CODE STATUS and he states he would not want to be intubated or have CPR performed.  I did  discuss with him that we may need to ultimately consider a bur hole for drainage of the blood and this can carry risks such as being intubated and they would still consider this.  I do think he needs a full medical evaluation to rule out any other comorbidities that could complicate this including any cardiovascular or pulmonary risk.   1.  Diagnosis: Bilateral subdural hematoma  2.  Plan -No emergent treatment needed.  We will continue to follow along and discuss options of treatment with the family and patient.

## 2019-02-22 NOTE — ED Notes (Signed)
Report given to ICU RN

## 2019-02-22 NOTE — H&P (Addendum)
[facility] [place] NAME: Jacob Moses    MR#:  [id number]  DATE OF BIRTH:  [birth year]  DATE OF ADMISSION:  02/22/2019  PRIMARY CARE PHYSICIAN: Albina Billet, MD   REQUESTING/REFERRING PHYSICIAN: Lenise Arena, MD  CHIEF COMPLAINT:   Chief Complaint  Patient presents with  . Fall    HISTORY OF PRESENT ILLNESS:  Jacob Moses  is a 85 y.o. Caucasian male with a known history of hypertension and peptic ulcer disease as well as vertigo, presented to the emergency room with concern of fall last night around 11:30 PM.  He stated that he was feeling too weak.  He apparently stayed on the ground the around 11 AM this morning as he did not have his Lifeline on him.  His pulmonary apparently was coming to the house when he found him this morning.  The patient denied any loss of consciousness.  He had headache last night and currently denied it.  No paresthesias or focal muscle weakness.  He admitted to hitting his head.  No fever or chills.  No dysuria, oliguria or hematuria or flank pain.  No chest pain or palpitations.  No cough or wheezing or hemoptysis.  No recent sick exposures.  Upon presentation to the emergency room, l blood pressure was 162/84 and otherwise vital signs within normal.  Later blood pressure was 140/78.  Labs revealed mild hyperkalemia 5.3 and a BUN of 30, CBC with WBC of 16.6 with neutrophilia and UA was unremarkable.  Head CT revealed bilateral convexity subdural hematomas which appear subacute.  Largest was on the left and measures 23 mm in thickness with 4 mm midline shift to the right.  C-spine CT was negative.  Dr. Lacinda Axon was notified about patient and will see him.  The patient was given 1 g of p.o. Tylenol and will be admitted to stepdown unit for further evaluation and management. PAST MEDICAL HISTORY:   Past Medical History:  Diagnosis Date  . H/O vertigo   . History of stomach ulcers   . HOH (hard of hearing)    . Hypertension   . Insomnia   . Wears dentures     PAST SURGICAL HISTORY:   Past Surgical History:  Procedure Laterality Date  . [place]  . [place]  2010  . ECTROPION REPAIR Bilateral 04/24/2015   Procedure: REPAIR OF ECTROPION TARSAL WEDGE LEFT EYE REPAIR EXTENSIVE BILATERAL;  Surgeon: Karle Starch, MD;  Location: [facility];  Service: Ophthalmology;  Laterality: Bilateral;  . FRACTURE SURGERY Left    arm  . [place]  . LACRIMAL DUCT EXPLORATION Bilateral 04/24/2015   Procedure: LACRIMAL DUCT EXPLORATION PUNCTOPLASTY;  Surgeon: Karle Starch, MD;  Location: [facility];  Service: Ophthalmology;  Laterality: Bilateral;  . SEPTOPLASTY    . STOMACH SURGERY  1966   ulcer    SOCIAL HISTORY:   Social History   Tobacco Use  . Smoking status: Former Smoker    Years: 30.00    Quit date: 05/05/1978    Years since quitting: 40.8  . Smokeless tobacco: Never Used  Substance Use Topics  . Alcohol use: No    FAMILY HISTORY:   Family History  Problem Relation Age of Onset  . Cancer Mother   . Diabetes Father     DRUG ALLERGIES:  No Known Allergies  REVIEW OF SYSTEMS:   ROS As per history  of present illness. All pertinent systems were reviewed above. Constitutional,  HEENT, cardiovascular, respiratory, GI, GU, musculoskeletal, neuro, psychiatric, endocrine,  integumentary and hematologic systems were reviewed and are otherwise  negative/unremarkable except for positive findings mentioned above in the HPI.   MEDICATIONS AT HOME:   Prior to Admission medications   Medication Sig Start Date End Date Taking? Authorizing Provider  ALPRAZolam Duanne Moron) 1 MG tablet Take 1 mg by mouth at bedtime.     [provider]  cetirizine (ZYRTEC) 10 MG tablet Take 10 mg by mouth daily after supper.    [provider]  diphenhydramine-acetaminophen (TYLENOL PM) 25-500 MG TABS tablet Take 1-2  tablets by mouth at bedtime as needed (pain/sleep).     [provider]  gabapentin (NEURONTIN) 100 MG capsule Take 100 mg by mouth at bedtime.     [provider]  traMADol (ULTRAM) 50 MG tablet Take 1 tablet (50 mg total) by mouth every 6 (six) hours as needed. Patient not taking: Reported on 02/22/2019 12/18/18 12/18/19  Lavonia Drafts, MD      VITAL SIGNS:  Blood pressure (!) 155/83, pulse 67, temperature 97.9 F (36.6 C), temperature source Axillary, resp. rate 18, height 5\' 11"  (1.803 m), weight 61.7 kg, SpO2 98 %.  PHYSICAL EXAMINATION:  Physical Exam  GENERAL:  85 y.o.-year-old Caucasian male patient lying in the bed with no acute distress.  EYES: Pupils equal, round, reactive to light and accommodation. No scleral icterus. Extraocular muscles intact.  HEENT: Head atraumatic, normocephalic. Oropharynx and nasopharynx clear.  NECK:  Supple, no jugular venous distention. No thyroid enlargement, no tenderness.  LUNGS: Normal breath sounds bilaterally, no wheezing, rales,rhonchi or crepitation. No use of accessory muscles of respiration.  CARDIOVASCULAR: Regular rate and rhythm, S1, S2 normal. No murmurs, rubs, or gallops.  ABDOMEN: Soft, nondistended, nontender. Bowel sounds present. No organomegaly or mass.  EXTREMITIES: No pedal edema, cyanosis, or clubbing.  Left wrist with slightly decreased range of motion with no tenderness or pain. NEUROLOGIC: Cranial nerves II through XII are intact. Muscle strength 5/5 in all extremities. Sensation intact. Gait not checked.  PSYCHIATRIC: The patient is alert and oriented x 3.  Normal affect and good eye contact. SKIN: No obvious rash, lesion, or ulcer.   LABORATORY PANEL:   CBC Recent Labs  Lab 02/22/19 1305  WBC 16.6*  HGB 12.6*  HCT 39.0  PLT 310   ------------------------------------------------------------------------------------------------------------------  Chemistries  Recent Labs  Lab 02/22/19 1305  NA  139  K 5.3*  CL 105  CO2 23  GLUCOSE 108*  BUN 30*  CREATININE 1.18  CALCIUM 9.5  AST 103*  ALT 28  ALKPHOS 90  BILITOT 1.1   ------------------------------------------------------------------------------------------------------------------  Cardiac Enzymes No results for input(s): TROPONINI in the last 168 hours. ------------------------------------------------------------------------------------------------------------------  RADIOLOGY:  Dg Wrist Complete Left  Result Date: 02/22/2019 CLINICAL DATA:  Recent fall and history of prior wrist fracture EXAM: LEFT WRIST - COMPLETE 3+ VIEW COMPARISON:  12/18/2018 FINDINGS: Comminuted distal radial fracture with intra-articular involvement is again identified. Impaction at the fracture site is noted increased from the prior exam. Ulnar styloid fracture is again seen. Subluxation of the first metacarpal from the trapezium is noted. This appears accentuated when compare with the prior exam. No other fractures are seen. IMPRESSION: Persistent distal radial and ulnar fractures with increased impaction at the fracture site. Electronically Signed   By: Inez Catalina M.D.   On: 02/22/2019 14:05   Ct Head Wo Contrast  Result Date: 02/22/2019  CLINICAL DATA:  Fall. EXAM: CT HEAD WITHOUT CONTRAST CT CERVICAL SPINE WITHOUT CONTRAST TECHNIQUE: Multidetector CT imaging of the head and cervical spine was performed following the standard protocol without intravenous contrast. Multiplanar CT image reconstructions of the cervical spine were also generated. COMPARISON:  CT head 12/18/2018 FINDINGS: CT HEAD FINDINGS Brain: Bilateral mixed density subdural hematomas. Majority of the blood is low-density however there are areas of layering high density in the subdural space bilaterally. Left-sided convexity subdural hematoma measures 23 mm in thickness with mild midline shift to the right of approximately 4 mm. Right subdural convexity subdural hematoma measures 11  mm in thickness. Ventricle size normal. No acute ischemic infarct. No mass or edema. Vascular: Negative for hyperdense vessel Skull: Negative for skull fracture. Sinuses/Orbits: Negative Other: None CT CERVICAL SPINE FINDINGS Alignment: Normal alignment.  Mild kyphosis at C5-6. Skull base and vertebrae: Negative for fracture Soft tissues and spinal canal: Negative for cervical spine fracture Disc levels: Multilevel disc and facet degeneration throughout the cervical spine. Foraminal stenosis bilaterally at multiple levels due to bony overgrowth. Upper chest: Mild apical scarring bilaterally. Other: None IMPRESSION: 1. Bilateral convexity subdural hematomas which appear subacute. The subdural blood is predominately low density on CT however there are areas of higher density bilaterally. Largest hematoma the left measures 23 mm in thickness with 4 mm midline shift to the right. 2. Negative for cervical spine fracture 3. These results were called by telephone at the time of interpretation on 02/22/2019 at 1:55 pm to provider Lenise Arena , who verbally acknowledged these results. Electronically Signed   By: Franchot Gallo M.D.   On: 02/22/2019 13:56   Ct Cervical Spine Wo Contrast  Result Date: 02/22/2019 CLINICAL DATA:  Fall. EXAM: CT HEAD WITHOUT CONTRAST CT CERVICAL SPINE WITHOUT CONTRAST TECHNIQUE: Multidetector CT imaging of the head and cervical spine was performed following the standard protocol without intravenous contrast. Multiplanar CT image reconstructions of the cervical spine were also generated. COMPARISON:  CT head 12/18/2018 FINDINGS: CT HEAD FINDINGS Brain: Bilateral mixed density subdural hematomas. Majority of the blood is low-density however there are areas of layering high density in the subdural space bilaterally. Left-sided convexity subdural hematoma measures 23 mm in thickness with mild midline shift to the right of approximately 4 mm. Right subdural convexity subdural hematoma  measures 11 mm in thickness. Ventricle size normal. No acute ischemic infarct. No mass or edema. Vascular: Negative for hyperdense vessel Skull: Negative for skull fracture. Sinuses/Orbits: Negative Other: None CT CERVICAL SPINE FINDINGS Alignment: Normal alignment.  Mild kyphosis at C5-6. Skull base and vertebrae: Negative for fracture Soft tissues and spinal canal: Negative for cervical spine fracture Disc levels: Multilevel disc and facet degeneration throughout the cervical spine. Foraminal stenosis bilaterally at multiple levels due to bony overgrowth. Upper chest: Mild apical scarring bilaterally. Other: None IMPRESSION: 1. Bilateral convexity subdural hematomas which appear subacute. The subdural blood is predominately low density on CT however there are areas of higher density bilaterally. Largest hematoma the left measures 23 mm in thickness with 4 mm midline shift to the right. 2. Negative for cervical spine fracture 3. These results were called by telephone at the time of interpretation on 02/22/2019 at 1:55 pm to provider Lenise Arena , who verbally acknowledged these results. Electronically Signed   By: Franchot Gallo M.D.   On: 02/22/2019 13:56      IMPRESSION AND PLAN:   1.  Bilateral subacute subdural hematoma secondary to fall.  The patient is currently alert  oriented x3.  We will be admitting him to a stepdown unit for further close monitoring.  Dr. Lacinda Axon was notified but the patient and he is aware.  I messaged him regarding the consult.  I also notified our intensivist Dr. Mortimer Fries.  The patient will have frequent neuro checks.  He will need physical therapy evaluation.  No surgical intervention is deemed urgent at this point.  2.  Mild hyperkalemia with associated azotemia likely related to mild dehydration.  He will be hydrated with IV normal saline and will repeat his potassium level.  3.  Hypertension.  We will keep his systolic blood pressure below 160.  He will be placed on as  needed IV labetalol.  4.  Left wrist fracture.  X-ray showed more impaction.  No current pain.  Pain management will be provided as needed.  5.  History of peptic ulcer disease.  Will place on PPI therapy.  6.  DVT prophylaxis.  SCDs.  Medical prophylaxis currently contraindicated due to his subdural hematomas.  GI prophylaxis was addressed above.  All the records are reviewed and case discussed with ED provider. The plan of care was discussed in details with the patient (and family). I answered all questions. The patient agreed to proceed with the above mentioned plan. Further management will depend upon hospital course.   CODE STATUS: Full code  TOTAL TIME TAKING CARE OF THIS PATIENT: 50 minutes.    Christel Mormon M.D on 02/22/2019 at 3:38 PM  Pager - [phone]  After 6pm go to [email or web address] - Proofreader  [facility]  Office  [phone]  CC: Primary care physician; Albina Billet, MD   Note: This dictation was prepared with Dragon dictation along with smaller phrase technology. Any transcriptional errors that result from this process are unintentional.

## 2019-02-22 NOTE — Consult Note (Signed)
Name: JAHSEAN CAIL MRN: [id number] DOB: 19-Jul-1927    ADMISSION DATE:  02/22/2019 CONSULTATION DATE:  02/22/2019  REFERRING MD :  Dr. Sidney Ace  CHIEF COMPLAINT:  Fall  BRIEF PATIENT DESCRIPTION:  85 year old male, DNR, admitted 10/20 status post fall.  Found to have bilateral subacute subdural hematomas.  Neurosurgery saw in the ED, no emergent surgical intervention at this time.  [facility] neuro checks.  SIGNIFICANT EVENTS  10/20>> Admission to Stepdown  STUDIES:  CT Head w/o Contrast 10/20>1. Bilateral convexity subdural hematomas which appear subacute. The subdural blood is predominately low density on CT however there are areas of higher density bilaterally. Largest hematoma the left measures 23 mm in thickness with 4 mm midline shift to the right. 2. Negative for cervical spine fracture>  X-ray Wrist 10/20>> Persistent distal radial and ulnar fractures with increased impaction at the fracture site.  CULTURES: SARS-CoV-2 PCR 10/20>> negative  ANTIBIOTICS: N/A  HISTORY OF PRESENT ILLNESS:   Mr. Fendt is a 85 year old male with a Past medical history notable for Hypertension, vertigo, hard of hearing, insomnia, and peptic ulcer disease who presented to [facility] ED on 02/22/19 status post fall.  He reports that he fell at approximately 11:30 PM on 02/21/19, but was unable to get up and was not wearing his LifeLine.  He was found this morning at approximately 11:00 AM by his plumber.  He reports that he did hit his head, but did not lose consciousness.  He denies paraesthesias or focal muscle weakness, and denies fever/chills, dysuria, hematuria or flank pain.  Upon presentation to the ED his blood pressure was 162/84, otherwise vitals were within normal limits.  Initial workup in the ED revealed potassium 5.3, BUN 30, WBC 16.6.  Urinalysis is negative for UTI, and his COVID-19 PCR is negative.  CT Head without contrast revealed bilateral subacute convexity  subdural hematomas with 4 mm midline shift to the right.  CT C-spine was negative.  Neurosurgery evaluated him while in the ED and determined he wasn't a surgical candidate at this time.  He is being admitted to [facility] by Hospitalist for further workup and treatment of bilateral subacute subdural hematomas status post fall.  PCCM is consulted for further management while in Stepdown.  PAST MEDICAL HISTORY :   has a past medical history of H/O vertigo, History of stomach ulcers, HOH (hard of hearing), Hypertension, Insomnia, and Wears dentures.  has a past surgical history that includes Stomach surgery (1966); Appendectomy (1999); Hernia repair (1999); Blepharoptosis repair (2010); Septoplasty; Fracture surgery (Left); Ectropion repair (Bilateral, 04/24/2015); and Lacrimal duct exploration (Bilateral, 04/24/2015). Prior to Admission medications   Medication Sig Start Date End Date Taking? Authorizing Provider  ALPRAZolam Duanne Moron) 1 MG tablet Take 1 mg by mouth at bedtime.     [provider]  cetirizine (ZYRTEC) 10 MG tablet Take 10 mg by mouth daily after supper.    [provider]  diphenhydramine-acetaminophen (TYLENOL PM) 25-500 MG TABS tablet Take 1-2 tablets by mouth at bedtime as needed (pain/sleep).     [provider]  gabapentin (NEURONTIN) 100 MG capsule Take 100 mg by mouth at bedtime.     [provider]  traMADol (ULTRAM) 50 MG tablet Take 1 tablet (50 mg total) by mouth every 6 (six) hours as needed. Patient not taking: Reported on 02/22/2019 12/18/18 12/18/19  Lavonia Drafts, MD   No Known Allergies  FAMILY HISTORY:  family history includes Cancer in his mother; Diabetes in  his father. SOCIAL HISTORY:  reports that he quit smoking about 40 years ago. He quit after 30.00 years of use. He has never used smokeless tobacco. He reports that he does not drink alcohol or use drugs.   COVID-19 DISASTER DECLARATION:  FULL CONTACT PHYSICAL EXAMINATION  WAS NOT POSSIBLE DUE TO TREATMENT OF COVID-19 AND  CONSERVATION OF PERSONAL PROTECTIVE EQUIPMENT, LIMITED EXAM FINDINGS INCLUDE-  Patient assessed or the symptoms described in the history of present illness.  In the context of the Global COVID-19 pandemic, which necessitated consideration that the patient might be at risk for infection with the SARS-CoV-2 virus that causes COVID-19, Institutional protocols and algorithms that pertain to the evaluation of patients at risk for COVID-19 are in a state of rapid change based on information released by regulatory bodies including the CDC and federal and state organizations. These policies and algorithms were followed during the patient's care while in hospital.  REVIEW OF SYSTEMS:  Positives in BOLD Constitutional: Negative for fever, chills, weight loss, malaise/fatigue and diaphoresis.  HENT: Negative for hearing loss, ear pain, nosebleeds, congestion, sore throat, neck pain, tinnitus and ear discharge.   Eyes: Negative for blurred vision, double vision, photophobia, pain, discharge and redness.  Respiratory: Negative for cough, hemoptysis, sputum production, shortness of breath, wheezing and stridor.   Cardiovascular: Negative for chest pain, palpitations, orthopnea, claudication, leg swelling and PND.  Gastrointestinal: Negative for heartburn, nausea, vomiting, abdominal pain, diarrhea, constipation, blood in stool and melena.  Genitourinary: Negative for dysuria, urgency, frequency, hematuria and flank pain.  Musculoskeletal: Negative for myalgias, back pain, joint pain and falls.  Skin: Negative for itching and rash.  Neurological: Negative for dizziness, tingling, tremors, sensory change, speech change, focal weakness, seizures, loss of consciousness, weakness and +headaches.  Endo/Heme/Allergies: Negative for environmental allergies and polydipsia. Does not bruise/bleed easily.  SUBJECTIVE:  Pt reports mild headache Denies chest pain,  shortness of breath, no dizziness or vision changes, fever/chills On room air Requests warm blankets  VITAL SIGNS: Temp:  [97.9 F (36.6 C)-98.8 F (37.1 C)] 98.8 F (37.1 C) (10/20 2100) Pulse Rate:  [67-75] 73 (10/20 1900) Resp:  [13-23] 16 (10/20 1915) BP: (113-162)/(66-85) 153/81 (10/20 1900) SpO2:  [98 %-100 %] 99 % (10/20 1900) Weight:  [57.9 kg-61.7 kg] 57.9 kg (10/20 2100)  PHYSICAL EXAMINATION: General:  Acutely ill appearing male, laying in bed, on room air, in no acute distress Neuro: Awake, alert and oriented x3, follows commands, speech clear, no focal deficits, pupil PERRLA HEENT: Atraumatic, normocephalic, neck supple, no JVD Cardiovascular: Regular rate and rhythm, S1-S2, no murmurs, rubs or gallops Lungs: Clear to auscultation bilaterally, no wheezing, even, nonlabored, normal effort Abdomen: Soft, nontender, nondistended, no guarding or rebound tenderness, bowel sounds positive x4 Musculoskeletal: No deformities, no edema. Motor strength 5/5 all extremities.  Skin: Warm and dry, no obvious rashes, lesions, or ulcerations  Recent Labs  Lab 02/22/19 1305  NA 139  K 5.3*  CL 105  CO2 23  BUN 30*  CREATININE 1.18  GLUCOSE 108*   Recent Labs  Lab 02/22/19 1305  HGB 12.6*  HCT 39.0  WBC 16.6*  PLT 310   Dg Wrist Complete Left  Result Date: 02/22/2019 CLINICAL DATA:  Recent fall and history of prior wrist fracture EXAM: LEFT WRIST - COMPLETE 3+ VIEW COMPARISON:  12/18/2018 FINDINGS: Comminuted distal radial fracture with intra-articular involvement is again identified. Impaction at the fracture site is noted increased from the prior exam. Ulnar styloid fracture is again seen. Subluxation  of the first metacarpal from the trapezium is noted. This appears accentuated when compare with the prior exam. No other fractures are seen. IMPRESSION: Persistent distal radial and ulnar fractures with increased impaction at the fracture site. Electronically Signed   By:  Inez Catalina M.D.   On: 02/22/2019 14:05   Ct Head Wo Contrast  Result Date: 02/22/2019 CLINICAL DATA:  Fall. EXAM: CT HEAD WITHOUT CONTRAST CT CERVICAL SPINE WITHOUT CONTRAST TECHNIQUE: Multidetector CT imaging of the head and cervical spine was performed following the standard protocol without intravenous contrast. Multiplanar CT image reconstructions of the cervical spine were also generated. COMPARISON:  CT head 12/18/2018 FINDINGS: CT HEAD FINDINGS Brain: Bilateral mixed density subdural hematomas. Majority of the blood is low-density however there are areas of layering high density in the subdural space bilaterally. Left-sided convexity subdural hematoma measures 23 mm in thickness with mild midline shift to the right of approximately 4 mm. Right subdural convexity subdural hematoma measures 11 mm in thickness. Ventricle size normal. No acute ischemic infarct. No mass or edema. Vascular: Negative for hyperdense vessel Skull: Negative for skull fracture. Sinuses/Orbits: Negative Other: None CT CERVICAL SPINE FINDINGS Alignment: Normal alignment.  Mild kyphosis at C5-6. Skull base and vertebrae: Negative for fracture Soft tissues and spinal canal: Negative for cervical spine fracture Disc levels: Multilevel disc and facet degeneration throughout the cervical spine. Foraminal stenosis bilaterally at multiple levels due to bony overgrowth. Upper chest: Mild apical scarring bilaterally. Other: None IMPRESSION: 1. Bilateral convexity subdural hematomas which appear subacute. The subdural blood is predominately low density on CT however there are areas of higher density bilaterally. Largest hematoma the left measures 23 mm in thickness with 4 mm midline shift to the right. 2. Negative for cervical spine fracture 3. These results were called by telephone at the time of interpretation on 02/22/2019 at 1:55 pm to provider Lenise Arena , who verbally acknowledged these results. Electronically Signed   By:  Franchot Gallo M.D.   On: 02/22/2019 13:56   Ct Cervical Spine Wo Contrast  Result Date: 02/22/2019 CLINICAL DATA:  Fall. EXAM: CT HEAD WITHOUT CONTRAST CT CERVICAL SPINE WITHOUT CONTRAST TECHNIQUE: Multidetector CT imaging of the head and cervical spine was performed following the standard protocol without intravenous contrast. Multiplanar CT image reconstructions of the cervical spine were also generated. COMPARISON:  CT head 12/18/2018 FINDINGS: CT HEAD FINDINGS Brain: Bilateral mixed density subdural hematomas. Majority of the blood is low-density however there are areas of layering high density in the subdural space bilaterally. Left-sided convexity subdural hematoma measures 23 mm in thickness with mild midline shift to the right of approximately 4 mm. Right subdural convexity subdural hematoma measures 11 mm in thickness. Ventricle size normal. No acute ischemic infarct. No mass or edema. Vascular: Negative for hyperdense vessel Skull: Negative for skull fracture. Sinuses/Orbits: Negative Other: None CT CERVICAL SPINE FINDINGS Alignment: Normal alignment.  Mild kyphosis at C5-6. Skull base and vertebrae: Negative for fracture Soft tissues and spinal canal: Negative for cervical spine fracture Disc levels: Multilevel disc and facet degeneration throughout the cervical spine. Foraminal stenosis bilaterally at multiple levels due to bony overgrowth. Upper chest: Mild apical scarring bilaterally. Other: None IMPRESSION: 1. Bilateral convexity subdural hematomas which appear subacute. The subdural blood is predominately low density on CT however there are areas of higher density bilaterally. Largest hematoma the left measures 23 mm in thickness with 4 mm midline shift to the right. 2. Negative for cervical spine fracture 3. These results were called  by telephone at the time of interpretation on 02/22/2019 at 1:55 pm to provider Lenise Arena , who verbally acknowledged these results. Electronically Signed    By: Franchot Gallo M.D.   On: 02/22/2019 13:56    ASSESSMENT / PLAN:  Bilateral subacute subdural hematomas secondary to fall at home -ICU monitoring -Frequent neuro checks -Neurosurgery consulted, appreciate input ~no emergent surgical procedure needed at this time per Neurosurgery -Physical therapy evaluation  Hypertension -Continuous cardiac monitoring -Maintain SBP less than 160 given SDH -As needed IV labetalol  Mild hyperkalemia -Monitor I&O's / urinary output -Follow BMP -Ensure adequate renal perfusion -Avoid nephrotoxic agents as able -Replace electrolytes as indicated  History of peptic ulcer disease -Continue PPI         Disposition: Stepdown Goals of care: DNR VTE prophylaxis: SCDs (no chemical prophylaxis given SDH) Updates: Updated patient & his daughter at bedside 02/22/2019  Darel Hong, [facility] [place] Pager: [phone]  02/22/2019, 9:08 PM

## 2019-02-22 NOTE — ED Notes (Signed)
Blue, light green, lavender and grey tubes sent to lab

## 2019-02-22 NOTE — ED Notes (Signed)
tegaderm and gauze applied to wound on neck, bleeding controlled. Daughter at bedside

## 2019-02-22 NOTE — Progress Notes (Signed)
   02/22/19 1600  Clinical Encounter Type  Visited With Family;Patient and family together  Visit Type Initial  Spiritual Encounters  Spiritual Needs Brochure   Chaplain received a referral from Care Management to [id number] HPOA documents for the patient. This chaplain talked with the patient's daughter and the patient. The patient confirmed interest in making his daughter, Chari Manning. AD paperwork provided and patient and daughter informed about how to proceed with finalizing the documents. The patient's daughter will ask the patient's nurse to call or page the on-call chaplain when ready to proceed tomorrow.

## 2019-02-22 NOTE — ED Notes (Signed)
covid test walked to the lab by this RN

## 2019-02-22 NOTE — TOC Initial Note (Addendum)
Transition of Care [facility]) - Initial/Assessment Note    Patient Details  Name: Jacob Moses MRN: [id number] Date of Birth: July 20, 1927  [facility] CM/SW Contact:    Jacob Stack, RN Phone Number: 02/22/2019, 1:45 PM  Clinical Narrative:                Patient fell at home during the night and was not able to get up.  Layed in the floor all night. Says it was a mechanical fall- that he tripped. Found by his daughterDamita Moses-  this morning.  He has a large skin tear on the right side of his neck.  He lives alone.  Lived at [facility] for several years but decided to move into a mobile home during the covid pandemic.  He has access to canes and walker, which he does not use. He fell and fracture  arm in August and at that time his daughter obtained Life Alert.  Patient was not wearing it at the time of the fall because he felt he did not need it.  He drives and is current with his PCP.  Provided daughter a list of agencies that can provide in home continuous care services. Patient was divorces from his daughter's mother when she was 38 years old.  She has not had a close relationship with patient but there have been some health concerns over the last couple of months.  There is concern for some bleeding found on head CT.          Patient Goals and CMS Choice        Expected Discharge Plan and Services                                                Prior Living Arrangements/Services                       Activities of Daily Living      Permission Sought/Granted                  Emotional Assessment              Admission diagnosis:  fall There are no active problems to display for this patient.  PCP:  Jacob Billet, MD Pharmacy:   Jacob Moses, [place] [address] Phone: [phone] Fax: [phone]     Social Determinants of Health (SDOH) Interventions     Readmission Risk Interventions No flowsheet data found.

## 2019-02-22 NOTE — ED Notes (Signed)
Pt otf for imaging, daughter remains in hall

## 2019-02-22 NOTE — ED Notes (Signed)
Daughter given crackers, peanut butter and shasta

## 2019-02-22 NOTE — ED Provider Notes (Signed)
[facility] Emergency Department Provider Note       Time seen: ----------------------------------------- 12:43 PM on 02/22/2019 -----------------------------------------   I have reviewed the triage vital signs and the nursing notes.  HISTORY   Chief Complaint Fall    HPI Jacob Moses is a 85 y.o. male with a history of hypertension, insomnia, hard of hearing who presents to the ED for a mechanical fall that occurred last night.  Patient states he was too weak to get up and he laid on the floor all night.  He denies taking any blood thinners.  He had a skin tear noted to the right side of his neck.  He does complain of a headache.  Past Medical History:  Diagnosis Date  . H/O vertigo   . History of stomach ulcers   . HOH (hard of hearing)   . Hypertension   . Insomnia   . Wears dentures     There are no active problems to display for this patient.   Past Surgical History:  Procedure Laterality Date  . [place]  . [place]  2010  . ECTROPION REPAIR Bilateral 04/24/2015   Procedure: REPAIR OF ECTROPION TARSAL WEDGE LEFT EYE REPAIR EXTENSIVE BILATERAL;  Surgeon: Karle Starch, MD;  Location: [facility];  Service: Ophthalmology;  Laterality: Bilateral;  . FRACTURE SURGERY Left    arm  . [place]  . LACRIMAL DUCT EXPLORATION Bilateral 04/24/2015   Procedure: LACRIMAL DUCT EXPLORATION PUNCTOPLASTY;  Surgeon: Karle Starch, MD;  Location: [facility];  Service: Ophthalmology;  Laterality: Bilateral;  . SEPTOPLASTY    . STOMACH SURGERY  1966   ulcer    Allergies Patient has no known allergies.  Social History Social History   Tobacco Use  . Smoking status: Former Smoker    Years: 30.00    Quit date: 05/05/1978    Years since quitting: 40.8  . Smokeless tobacco: Never Used  Substance Use Topics  . Alcohol use: No  . Drug use: No    Review of  Systems Constitutional: Negative for fever. Cardiovascular: Negative for chest pain. Respiratory: Negative for shortness of breath. Gastrointestinal: Negative for abdominal pain, vomiting and diarrhea. Musculoskeletal: Negative for back pain. Skin: Positive for skin tear on the right side of his neck Neurological: Positive for headache  All systems negative/normal/unremarkable except as stated in the HPI  ____________________________________________   PHYSICAL EXAM:  VITAL SIGNS: ED Triage Vitals  Enc Vitals Group     BP --      Pulse --      Resp --      Temp --      Temp src --      SpO2 --      Weight 02/22/19 1236 136 lb (61.7 kg)     Height 02/22/19 1236 5\' 11"  (1.803 m)     Head Circumference --      Peak Flow --      Pain Score 02/22/19 1235 6     Pain Loc --      Pain Edu? --      Excl. in [place]? --    Constitutional: Alert and oriented.  Chronically ill-appearing, no distress Eyes: Conjunctivae are normal. Normal extraocular movements. ENT      Head: Normocephalic and atraumatic.      Nose: No congestion/rhinnorhea.      Mouth/Throat: Mucous membranes are moist.  Neck: There is a large skin tear on the right anterior aspect of his neck Cardiovascular: Normal rate, regular rhythm. No murmurs, rubs, or gallops. Respiratory: Normal respiratory effort without tachypnea nor retractions. Breath sounds are clear and equal bilaterally. No wheezes/rales/rhonchi. Gastrointestinal: Soft and nontender. Normal bowel sounds Musculoskeletal: Nontender with normal range of motion in extremities. No lower extremity tenderness nor edema. Neurologic:  Normal speech and language. No gross focal neurologic deficits are appreciated.  Skin: Skin tears noted to the right anterior neck Psychiatric: Mood and affect are normal. Speech and behavior are normal.  ____________________________________________  ED COURSE:  As part of my medical decision making, I reviewed the following  data within the [place] History obtained from family if available, nursing notes, old chart and ekg, as well as notes from prior ED visits. Patient presented for a fall, we will assess with labs and imaging as indicated at this time.   Procedures  AC GLADE was evaluated in Emergency Department on 02/22/2019 for the symptoms described in the history of present illness. He was evaluated in the context of the global COVID-19 pandemic, which necessitated consideration that the patient might be at risk for infection with the SARS-CoV-2 virus that causes COVID-19. Institutional protocols and algorithms that pertain to the evaluation of patients at risk for COVID-19 are in a state of rapid change based on information released by regulatory bodies including the CDC and federal and state organizations. These policies and algorithms were followed during the patient's care in the ED.  ____________________________________________   LABS (pertinent positives/negatives)  Labs Reviewed  CBC WITH DIFFERENTIAL/PLATELET - Abnormal; Notable for the following components:      Result Value   WBC 16.6 (*)    RBC 4.13 (*)    Hemoglobin 12.6 (*)    Neutro Abs 14.5 (*)    Lymphs Abs 0.6 (*)    Monocytes Absolute 1.4 (*)    Abs Immature Granulocytes 0.09 (*)    All other components within normal limits  COMPREHENSIVE METABOLIC PANEL - Abnormal; Notable for the following components:   Potassium 5.3 (*)    Glucose, Bld 108 (*)    BUN 30 (*)    AST 103 (*)    GFR calc non Af Amer 54 (*)    All other components within normal limits  URINALYSIS, COMPLETE (UACMP) WITH MICROSCOPIC    RADIOLOGY Images were viewed by me  CT head, chest x-ray Left wrist x-ray IMPRESSION:  Persistent distal radial and ulnar fractures with increased  impaction at the fracture site.  IMPRESSION:  1. Bilateral convexity subdural hematomas which appear subacute. The  subdural blood is predominately low  density on CT however there are  areas of higher density bilaterally. Largest hematoma the left  measures 23 mm in thickness with 4 mm midline shift to the right.  2. Negative for cervical spine fracture  3. These results were called by telephone at the time of  interpretation on 02/22/2019 at 1:55 pm to provider Lenise Arena , who verbally acknowledged these results.  ____________________________________________   DIFFERENTIAL DIAGNOSIS   Subdural hematoma, dehydration, electrolyte abnormality, occult infection  FINAL ASSESSMENT AND PLAN  Fall, bilateral subdural hematomas, old distal radius and ulnar fractures   Plan: The patient had presented for a fall. Patient's labs did reveal some leukocytosis. Patient's imaging revealed bilateral subdural hematomas which are subacute.  CT C-spine was negative.  I have discussed with neurosurgery, we are establishing a plan for this  patient.   Laurence Aly, MD    Note: This note was generated in part or whole with voice recognition software. Voice recognition is usually quite accurate but there are transcription errors that can and very often do occur. I apologize for any typographical errors that were not detected and corrected.     Earleen Newport, MD 02/22/19 1452

## 2019-02-22 NOTE — ED Notes (Addendum)
Daughter voiced concern about the way pt was eating. I assessed patient having some difficulty getting chips or spoon to his mouth, pt A&Ox4, previous neuro exam normal, PERRLA, speech clear. Pt not having any difficulty with chewing or swallowing.  Dr. Sidney Ace informed, will ctm

## 2019-02-22 NOTE — ED Notes (Addendum)
Per admitting provider pt ok to eat and drink, provided with vanilla ice cream, [place] sandwich tray and water

## 2019-02-22 NOTE — ED Notes (Signed)
Per ICU pt needs rapid covid swab for admission, lab states the previous swab is still in the lab and the order needs to be changed to rapid test, Dr. Joan Mayans informed and swab changed to rapid

## 2019-02-22 NOTE — TOC Progression Note (Signed)
Transition of Care [facility]) - Progression Note    Patient Details  Name: Jacob Moses MRN: [id number] Date of Birth: 1927-12-05  Transition of [facility]) CM/SW Contact  Katrina Stack, RN Phone Number: 02/22/2019, 5:58 PM  Clinical Narrative:   Ruled in for bilateral subdural hematomas left larger than right.  Some of it appears old and some acute. Evaluated by neurosurgery.  Discussed visit status with attending and  is admitting as inpatient to stepdown. Have contacted chaplain services for health care power of attorney per daughter request..  Patient is alert and oriented at present.          Expected Discharge Plan and Services                                                 Social Determinants of Health (SDOH) Interventions    Readmission Risk Interventions No flowsheet data found.

## 2019-02-23 DIAGNOSIS — S065X9A Traumatic subdural hemorrhage with loss of consciousness of unspecified duration, initial encounter: Secondary | ICD-10-CM | POA: Diagnosis not present

## 2019-02-23 LAB — CBC
HCT: 35.5 % — ABNORMAL LOW (ref 39.0–52.0)
Hemoglobin: 11.3 g/dL — ABNORMAL LOW (ref 13.0–17.0)
MCH: 30.1 pg (ref 26.0–34.0)
MCHC: 31.8 g/dL (ref 30.0–36.0)
MCV: 94.4 fL (ref 80.0–100.0)
Platelets: 279 10*3/uL (ref 150–400)
RBC: 3.76 MIL/uL — ABNORMAL LOW (ref 4.22–5.81)
RDW: 13.7 % (ref 11.5–15.5)
WBC: 11.7 10*3/uL — ABNORMAL HIGH (ref 4.0–10.5)
nRBC: 0 % (ref 0.0–0.2)

## 2019-02-23 LAB — COMPREHENSIVE METABOLIC PANEL
ALT: 25 U/L (ref 0–44)
AST: 85 U/L — ABNORMAL HIGH (ref 15–41)
Albumin: 3.4 g/dL — ABNORMAL LOW (ref 3.5–5.0)
Alkaline Phosphatase: 75 U/L (ref 38–126)
Anion gap: 8 (ref 5–15)
BUN: 35 mg/dL — ABNORMAL HIGH (ref 8–23)
CO2: 24 mmol/L (ref 22–32)
Calcium: 8.8 mg/dL — ABNORMAL LOW (ref 8.9–10.3)
Chloride: 109 mmol/L (ref 98–111)
Creatinine, Ser: 1.36 mg/dL — ABNORMAL HIGH (ref 0.61–1.24)
GFR calc Af Amer: 53 mL/min — ABNORMAL LOW (ref >60)
GFR calc non Af Amer: 46 mL/min — ABNORMAL LOW (ref >60)
Glucose, Bld: 99 mg/dL (ref 70–99)
Potassium: 4.7 mmol/L (ref 3.5–5.1)
Sodium: 141 mmol/L (ref 135–145)
Total Bilirubin: 0.8 mg/dL (ref 0.3–1.2)
Total Protein: 6.5 g/dL (ref 6.5–8.1)

## 2019-02-23 LAB — PROTIME-INR
INR: 1.1 (ref 0.8–1.2)
Prothrombin Time: 14.3 seconds (ref 11.4–15.2)

## 2019-02-23 LAB — APTT: aPTT: 43 seconds — ABNORMAL HIGH (ref 24–36)

## 2019-02-23 LAB — CK: Total CK: 2380 U/L — ABNORMAL HIGH (ref 49–397)

## 2019-02-23 MED ORDER — SODIUM CHLORIDE 0.9 % IV BOLUS
1000.0000 mL | Freq: Once | INTRAVENOUS | Status: AC
  Administered 2019-02-23: 250 mL via INTRAVENOUS

## 2019-02-23 MED ORDER — ADULT MULTIVITAMIN W/MINERALS CH
1.0000 | ORAL_TABLET | Freq: Every day | ORAL | Status: DC
  Administered 2019-02-24 – 2019-02-25 (×2): 1 via ORAL
  Filled 2019-02-23 (×2): qty 1

## 2019-02-23 NOTE — Progress Notes (Signed)
Pt received from ER to rm #10 via Stretcher. A &O x 4, GCS=15 ; VS stable. He had small hematoma to posterior of his head from  Falls @ home . Bilateral arms with skin tears. Bilateral hips with bruishing Daughter @ bedside updated Will continue to monitor pt closely,

## 2019-02-23 NOTE — Progress Notes (Addendum)
[place] at [place] NAME: Jacob Moses    MR#:  [id number]  DATE OF BIRTH:  Oct 03, 1927  SUBJECTIVE:  CHIEF COMPLAINT:   Chief Complaint  Patient presents with  . Fall   The patient has no complaints. REVIEW OF SYSTEMS:  Review of Systems  Constitutional: Positive for malaise/fatigue. Negative for chills and fever.  HENT: Negative for sore throat.   Eyes: Negative for blurred vision and double vision.  Respiratory: Negative for cough, hemoptysis, shortness of breath, wheezing and stridor.   Cardiovascular: Negative for chest pain, palpitations, orthopnea and leg swelling.  Gastrointestinal: Negative for abdominal pain, blood in stool, diarrhea, melena, nausea and vomiting.  Genitourinary: Negative for dysuria, flank pain and hematuria.  Musculoskeletal: Negative for back pain and joint pain.  Neurological: Negative for dizziness, sensory change, focal weakness, seizures, loss of consciousness, weakness and headaches.  Endo/Heme/Allergies: Negative for polydipsia.  Psychiatric/Behavioral: Negative for depression. The patient is not nervous/anxious.     DRUG ALLERGIES:  No Known Allergies VITALS:  Blood pressure (!) 155/73, pulse 69, temperature 97.9 F (36.6 C), temperature source Axillary, resp. rate 17, height 5\' 11"  (1.803 m), weight 57.9 kg, SpO2 100 %. PHYSICAL EXAMINATION:  Physical Exam Constitutional:      General: He is not in acute distress. HENT:     Head: Normocephalic.  Eyes:     General: No scleral icterus.    Conjunctiva/sclera: Conjunctivae normal.     Pupils: Pupils are equal, round, and reactive to light.  Neck:     Musculoskeletal: Normal range of motion and neck supple.     Vascular: No JVD.     Trachea: No tracheal deviation.  Cardiovascular:     Rate and Rhythm: Normal rate and regular rhythm.     Heart sounds: Normal heart sounds. No murmur. No gallop.   Pulmonary:     Effort: Pulmonary effort is  normal. No respiratory distress.     Breath sounds: Normal breath sounds. No wheezing or rales.  Abdominal:     General: Bowel sounds are normal. There is no distension.     Palpations: Abdomen is soft.     Tenderness: There is no abdominal tenderness. There is no rebound.  Musculoskeletal: Normal range of motion.        General: No tenderness.  Skin:    Findings: No erythema or rash.  Neurological:     Mental Status: He is alert and oriented to person, place, and time.     Cranial Nerves: No cranial nerve deficit.     Comments: Right leg weakness.  Psychiatric:        Mood and Affect: Mood normal.    LABORATORY PANEL:  Male CBC Recent Labs  Lab 02/23/19 0514  WBC 11.7*  HGB 11.3*  HCT 35.5*  PLT 279   ------------------------------------------------------------------------------------------------------------------ Chemistries  Recent Labs  Lab 02/23/19 0514  NA 141  K 4.7  CL 109  CO2 24  GLUCOSE 99  BUN 35*  CREATININE 1.36*  CALCIUM 8.8*  AST 85*  ALT 25  ALKPHOS 75  BILITOT 0.8   RADIOLOGY:  No results found. ASSESSMENT AND PLAN:   1.  Bilateral subacute subdural hematoma secondary to fall.   No surgical intervention, PT and OT per Dr. Cari Caraway.  2.  Mild hyperkalemia with associated azotemia likely related to mild dehydration.  Potassium decreased from normal range. Continue IV normal saline.  Follow-up BMP.  Rhabdomyolysis.  Continue IV fluid support  and follow-up CK.  3.  Hypertension.    on as needed IV labetalol.  4.  Left wrist fracture.  X-ray showed more impaction.   Pain management as needed.  Follow-up orthopedic consult.  5.  History of peptic ulcer disease.  Will place on PPI therapy.  6.  DVT prophylaxis.  SCDs.  Medical prophylaxis currently contraindicated due to his subdural hematomas.  GI prophylaxis was addressed above.  All the records are reviewed and case discussed with Care Management/Social Worker. Management  plans discussed with the patient, his daughter and they are in agreement.  CODE STATUS: DNR  TOTAL TIME TAKING CARE OF THIS PATIENT:  32 minutes.   More than 50% of the time was spent in counseling/coordination of care: YES  POSSIBLE D/C IN 2 DAYS, DEPENDING ON CLINICAL CONDITION.   Demetrios Loll M.D on 02/23/2019 at 2:46 PM  Between 7am to 6pm - Pager - [phone]  After 6pm go to [email or web address] - [facility]

## 2019-02-23 NOTE — Plan of Care (Signed)
Lab results WNL.

## 2019-02-23 NOTE — Progress Notes (Signed)
OT Cancellation Note  Patient Details Name: Jacob Moses MRN: [id number] DOB: 13-Sep-1927   Cancelled Treatment:    Reason Eval/Treat Not Completed: Other (comment)(Thank you for the OT consult. Chart reviewed. Per chart patient with chronic L wrist fracture, that may have progressed acutely s/p most recent fall, pending orthopedic consult. OT to initiate evaluation when weight bearing status has been confirmed.)   Shara Blazing, M.S., OTR/L Ascom: [phone] 02/23/19, 12:44 PM   Loveta Dellis Roosvelt Maser 02/23/2019, 12:43 PM

## 2019-02-23 NOTE — Progress Notes (Addendum)
Initial Nutrition Assessment  DOCUMENTATION CODES:   Severe malnutrition in context of social or environmental circumstances  INTERVENTION:   Vital Cuisine BID, each supplement provides 520kcal and 22g of protein.   Magic cup TID with meals, each supplement provides 290 kcal and 9 grams of protein  MVI daily   Assist with meals   NUTRITION DIAGNOSIS:   Severe Malnutrition related to social / environmental circumstances(advanced age) as evidenced by severe muscle depletion, severe fat depletion.  GOAL:   Patient will meet greater than or equal to 90% of their needs  MONITOR:   PO intake, Supplement acceptance, Labs, Weight trends, Skin, I & O's  REASON FOR ASSESSMENT:   Other (Comment)(Low BMI)    ASSESSMENT:   85 year old male with a Past medical history notable for Hypertension, vertigo, hard of hearing, insomnia, and peptic ulcer disease who presented to [facility] ED on 02/22/19 status post fall and found to have Bilateral subacute subdural hematomas   Met with patient and daughter in room today. Pt sleeping at time of RD visit so history obtained from pt's daughter at bedside. Daughter reports patient with good appetite and oral intake at baseline. Pt eats a lot of vegetables at home but tends to eat a diet low in protein with exception of some sandwich meat. Pt does not like ONS as pt's daughter has tried to give these to patient before. Pt ate all of his lunch today which included broccoli, corn and a sandwich. Pt asking for more food after lunch. Pt does wear dentures but is able to chew tough meats ok per daughter. Daughter reports patient with a slow, steady weight loss over the past couple of years but reports pt broke his arm about two months ago and has lost a significant amount of weight since then. Per chart, pt has lost 11lbs(8%) over the past two months; this is significant. RD will add supplements to meal trays to help pt meet his estimated needs.    Medications  reviewed and include: NaCl '@100ml' /hr  Labs reviewed: BUN 35(H), creat 1.36(H) Wbc- 11.7(H)  NUTRITION - FOCUSED PHYSICAL EXAM:    Most Recent Value  Orbital Region  Severe depletion  Upper Arm Region  Severe depletion  Thoracic and Lumbar Region  Severe depletion  Buccal Region  Severe depletion  Temple Region  Severe depletion  Clavicle Bone Region  Severe depletion  Clavicle and Acromion Bone Region  Severe depletion  Scapular Bone Region  Severe depletion  Dorsal Hand  Severe depletion  Patellar Region  Severe depletion  Anterior Thigh Region  Severe depletion  Posterior Calf Region  Severe depletion  Edema (RD Assessment)  Mild  Hair  Reviewed  Eyes  Reviewed  Mouth  Reviewed  Skin  ecchymosis   Nails  Reviewed     Diet Order:   Diet Order            Diet regular Room service appropriate? Yes; Fluid consistency: Thin  Diet effective now             EDUCATION NEEDS:   Education needs have been addressed(with family member at bedside)  Skin:  Skin Assessment: Reviewed RN Assessment(ecchymosis)  Last BM:  pta  Height:   Ht Readings from Last 1 Encounters:  02/22/19 '5\' 11"'  (1.803 m)    Weight:   Wt Readings from Last 1 Encounters:  02/22/19 57.9 kg    Ideal Body Weight:  78.2 kg  BMI:  Body mass index is 17.8  kg/m.  Estimated Nutritional Needs:   Kcal:  1700-1900kcal/day  Protein:  85-95g/day  Fluid:  >1.4L/day  Koleen Distance MS, RD, LDN Pager #- [phone] Office#- [phone] After Hours Pager: [phone]

## 2019-02-23 NOTE — Progress Notes (Signed)
PT Cancellation Note  Patient Details Name: Jacob Moses MRN: [id number] DOB: 1928-03-16   Cancelled Treatment:    Reason Eval/Treat Not Completed: Other (comment)(Chart reviewed. Per chart patient with chronic L wrist fracture, that may have progressed acutely s/p most recent fall, pending orthopedic consult. PT to initiate evaluation when weight bearing status has been confirmed.)   Lieutenant Diego PT, DPT [phone] PM,02/23/19 [phone]

## 2019-02-23 NOTE — Progress Notes (Signed)
    Attending Progress Note  History: Jacob Moses is here for evaluation for falls and a L subdural hematoma.  He was seen by my partner Cathleen Fears yesterday in consultation.    On my review, the patient denies any new symptoms.  He denies headache, nausea, or vomiting.  He reports that his thinking is clear and he feels normal.  He denies weakness.  Physical Exam: Vitals:   02/23/19 1200 02/23/19 1217  BP: (!) 155/73   Pulse: 77 69  Resp: 18 17  Temp:  97.9 F (36.6 C)  SpO2: 100% 100%    AA Ox3 CNI Mild R drift Strength:5/5 throughout BUE and BLE SILT Reflexes 1+  Data:  Recent Labs  Lab 02/22/19 1305 02/23/19 0514  NA 139 141  K 5.3* 4.7  CL 105 109  CO2 23 24  BUN 30* 35*  CREATININE 1.18 1.36*  GLUCOSE 108* 99  CALCIUM 9.5 8.8*   Recent Labs  Lab 02/23/19 0514  AST 85*  ALT 25  ALKPHOS 75     Recent Labs  Lab 02/22/19 1305 02/23/19 0514  WBC 16.6* 11.7*  HGB 12.6* 11.3*  HCT 39.0 35.5*  PLT 310 279   Recent Labs  Lab 02/23/19 0514  APTT 43*  INR 1.1         Other tests/results: CT reviewed - mixed density but mostly chronic L subdural hematoma measuring 46mm thick at greatest thickness with 4 mm MLS  Assessment/Plan:  [name] is stable after observation in the ICU for L subdural hematoma.  He has mild symptoms from this fluid collection.  - Stable to transfer out of ICU - Would start PTOT and consider placement if not safe for discharge home - I discussed options with patient and his daughter including surgical intervention with burr hole drainage.  He and his daughter expressed that he would not like to undergo surgery currently, and would prefer to try conservative management. - Once discharged, we will arrange follow up at [facility].  Meade Maw MD, [facility] Department of Neurosurgery

## 2019-02-24 DIAGNOSIS — E43 Unspecified severe protein-calorie malnutrition: Secondary | ICD-10-CM | POA: Insufficient documentation

## 2019-02-24 LAB — CK: Total CK: 1122 U/L — ABNORMAL HIGH (ref 49–397)

## 2019-02-24 LAB — BASIC METABOLIC PANEL
Anion gap: 8 (ref 5–15)
BUN: 35 mg/dL — ABNORMAL HIGH (ref 8–23)
CO2: 21 mmol/L — ABNORMAL LOW (ref 22–32)
Calcium: 8.1 mg/dL — ABNORMAL LOW (ref 8.9–10.3)
Chloride: 109 mmol/L (ref 98–111)
Creatinine, Ser: 1.16 mg/dL (ref 0.61–1.24)
GFR calc Af Amer: >60 mL/min (ref >60)
GFR calc non Af Amer: 56 mL/min — ABNORMAL LOW (ref >60)
Glucose, Bld: 102 mg/dL — ABNORMAL HIGH (ref 70–99)
Potassium: 4.4 mmol/L (ref 3.5–5.1)
Sodium: 138 mmol/L (ref 135–145)

## 2019-02-24 MED ORDER — PANTOPRAZOLE SODIUM 40 MG PO TBEC
40.0000 mg | DELAYED_RELEASE_TABLET | Freq: Every day | ORAL | Status: DC
  Administered 2019-02-24 – 2019-02-25 (×2): 40 mg via ORAL
  Filled 2019-02-24 (×2): qty 1

## 2019-02-24 MED ORDER — INFLUENZA VAC A&B SA ADJ QUAD 0.5 ML IM PRSY
0.5000 mL | PREFILLED_SYRINGE | INTRAMUSCULAR | Status: DC
  Filled 2019-02-24: qty 0.5

## 2019-02-24 MED ORDER — AMLODIPINE BESYLATE 5 MG PO TABS
5.0000 mg | ORAL_TABLET | Freq: Every day | ORAL | Status: DC
  Administered 2019-02-24 – 2019-02-25 (×2): 5 mg via ORAL
  Filled 2019-02-24 (×2): qty 1

## 2019-02-24 NOTE — Progress Notes (Signed)
[place] at [place] NAME: Jacob Moses    MR#:  [id number]  DATE OF BIRTH:  1927/12/14  SUBJECTIVE:  CHIEF COMPLAINT:   Chief Complaint  Patient presents with  . Fall   The patient has no complaints. REVIEW OF SYSTEMS:  Review of Systems  Constitutional: Positive for malaise/fatigue. Negative for chills and fever.  HENT: Negative for sore throat.   Eyes: Negative for blurred vision and double vision.  Respiratory: Negative for cough, hemoptysis, shortness of breath, wheezing and stridor.   Cardiovascular: Negative for chest pain, palpitations, orthopnea and leg swelling.  Gastrointestinal: Negative for abdominal pain, blood in stool, diarrhea, melena, nausea and vomiting.  Genitourinary: Negative for dysuria, flank pain and hematuria.  Musculoskeletal: Negative for back pain and joint pain.  Neurological: Negative for dizziness, sensory change, focal weakness, seizures, loss of consciousness, weakness and headaches.  Endo/Heme/Allergies: Negative for polydipsia.  Psychiatric/Behavioral: Negative for depression. The patient is not nervous/anxious.     DRUG ALLERGIES:  No Known Allergies VITALS:  Blood pressure (!) 147/67, pulse 60, temperature 98 F (36.7 C), temperature source Axillary, resp. rate 16, height 5\' 11"  (1.803 m), weight 57.9 kg, SpO2 96 %. PHYSICAL EXAMINATION:  Physical Exam Constitutional:      General: He is not in acute distress.    Comments: Severe malnutrition.  HENT:     Head: Normocephalic.  Eyes:     General: No scleral icterus.    Conjunctiva/sclera: Conjunctivae normal.     Pupils: Pupils are equal, round, and reactive to light.  Neck:     Musculoskeletal: Normal range of motion and neck supple.     Vascular: No JVD.     Trachea: No tracheal deviation.  Cardiovascular:     Rate and Rhythm: Normal rate and regular rhythm.     Heart sounds: Normal heart sounds. No murmur. No gallop.   Pulmonary:      Effort: Pulmonary effort is normal. No respiratory distress.     Breath sounds: Normal breath sounds. No wheezing or rales.  Abdominal:     General: Bowel sounds are normal. There is no distension.     Palpations: Abdomen is soft.     Tenderness: There is no abdominal tenderness. There is no rebound.  Musculoskeletal: Normal range of motion.        General: No tenderness.  Skin:    Findings: No erythema or rash.  Neurological:     Mental Status: He is alert and oriented to person, place, and time.     Cranial Nerves: No cranial nerve deficit.     Comments: Right leg weakness.  Psychiatric:        Mood and Affect: Mood normal.    LABORATORY PANEL:  Male CBC Recent Labs  Lab 02/23/19 0514  WBC 11.7*  HGB 11.3*  HCT 35.5*  PLT 279   ------------------------------------------------------------------------------------------------------------------ Chemistries  Recent Labs  Lab 02/23/19 0514 02/24/19 0358  NA 141 138  K 4.7 4.4  CL 109 109  CO2 24 21*  GLUCOSE 99 102*  BUN 35* 35*  CREATININE 1.36* 1.16  CALCIUM 8.8* 8.1*  AST 85*  --   ALT 25  --   ALKPHOS 75  --   BILITOT 0.8  --    RADIOLOGY:  No results found. ASSESSMENT AND PLAN:   1.  Bilateral subacute subdural hematoma secondary to fall.   No surgical intervention, PT and OT per Dr. Cari Caraway.  2.  Mild  hyperkalemia with associated azotemia likely related to mild dehydration.  Potassium decreased from normal range. Continue IV normal saline.  Follow-up BMP.  Rhabdomyolysis.  Continue IV fluid support and follow-up CK.  3.  Hypertension.   Norvasc po  and as needed IV labetalol.  4.  Left wrist fracture.  X-ray showed more impaction.   Pain management as needed.  Follow-up orthopedic consult.  5.  History of peptic ulcer disease.  On PPI therapy.  6.  DVT prophylaxis.  SCDs.  Medical prophylaxis currently contraindicated due to his subdural hematomas.  GI prophylaxis was addressed above.   Severe malnutrition.  Follow dietitian's recommendation. PT evaluation suggest skilled nursing facility placement.  All the records are reviewed and case discussed with Care Management/Social Worker. Management plans discussed with the patient, his daughter and they are in agreement.  CODE STATUS: DNR  TOTAL TIME TAKING CARE OF THIS PATIENT:  30 minutes.   More than 50% of the time was spent in counseling/coordination of care: YES  POSSIBLE D/C IN 1-2 DAYS, DEPENDING ON CLINICAL CONDITION.   Demetrios Loll M.D on 02/24/2019 at 2:41 PM  Between 7am to 6pm - Pager - [phone]  After 6pm go to [email or web address] - [employer] [facility] Hospital[facility]

## 2019-02-24 NOTE — NC FL2 (Signed)
[place] LEVEL OF CARE SCREENING TOOL     IDENTIFICATION  Patient Name: Jacob Moses Birthdate: 08-12-1927 Sex: male Admission Date (Current Location): 02/22/2019  [place] and [place] Number:  Engineering geologist and Address:  [facility], [address] [place], [place] [address]      Provider Number: [id number]  Attending Physician Name and Address:  Demetrios Loll, MD  Relative Name and Phone Number:       Current Level of Care: Hospital Recommended Level of Care: [place] Prior Approval Number:    Date Approved/Denied:   PASRR Number: Will look up later. [place] Must website malfunctioning.  Discharge Plan: SNF    Current Diagnoses: Patient Active Problem List   Diagnosis Date Noted  . SDH (subdural hematoma) ([place]) 02/22/2019    Orientation RESPIRATION BLADDER Height & Weight     Self, Time, Situation, Place  Normal Incontinent, External catheter Weight: 127 lb 10.3 oz (57.9 kg) Height:  5\' 11"  (180.3 cm)  BEHAVIORAL SYMPTOMS/MOOD NEUROLOGICAL BOWEL NUTRITION STATUS  (None) (None) Continent Diet(Regular)  AMBULATORY STATUS COMMUNICATION OF NEEDS Skin   Extensive Assist Verbally Skin abrasions, Other (Comment), Bruising(Blister, Cracking, Skin Tear.)                       Personal Care Assistance Level of Assistance  Bathing, Feeding, Dressing Bathing Assistance: Limited assistance Feeding assistance: Limited assistance Dressing Assistance: Limited assistance     Functional Limitations Info  Sight, Hearing, Speech Sight Info: Adequate Hearing Info: Impaired Speech Info: Adequate    SPECIAL CARE FACTORS FREQUENCY  PT (By licensed PT), OT (By licensed OT)     PT Frequency: 5 x week OT Frequency: 5 x week            Contractures Contractures Info: Not present    Additional Factors Info  Code Status, Allergies Code Status Info: DNR Allergies Info: NKDA           Current  Medications (02/24/2019):  This is the current hospital active medication list Current Facility-Administered Medications  Medication Dose Route Frequency Provider Last Rate Last Dose  . 0.9 %  sodium chloride infusion   Intravenous Continuous Demetrios Loll, MD 75 mL/hr at 02/24/19 0909    . acetaminophen (TYLENOL) tablet 650 mg  650 mg Oral Q6H PRN Mansy, Jan A, MD   650 mg at 02/24/19 1417   Or  . acetaminophen (TYLENOL) suppository 650 mg  650 mg Rectal Q6H PRN Mansy, Jan A, MD      . amLODipine (NORVASC) tablet 5 mg  5 mg Oral Daily Demetrios Loll, MD      . Derrill Memo ON 02/25/2019] influenza vaccine adjuvanted (FLUAD) injection 0.5 mL  0.5 mL Intramuscular Tomorrow-1000 Demetrios Loll, MD      . labetalol (NORMODYNE) injection 20 mg  20 mg Intravenous Q3H PRN Mansy, Jan A, MD      . loratadine (CLARITIN) tablet 10 mg  10 mg Oral Daily Mansy, Jan A, MD   10 mg at 02/24/19 0909  . magnesium hydroxide (MILK OF MAGNESIA) suspension 30 mL  30 mL Oral Daily PRN Mansy, Jan A, MD      . multivitamin with minerals tablet 1 tablet  1 tablet Oral Daily Demetrios Loll, MD   1 tablet at 02/24/19 0909  . ondansetron (ZOFRAN) tablet 4 mg  4 mg Oral Q6H PRN Mansy, Arvella Merles, MD       Or  .  ondansetron (ZOFRAN) injection 4 mg  4 mg Intravenous Q6H PRN Mansy, Jan A, MD      . pantoprazole (PROTONIX) EC tablet 40 mg  40 mg Oral Daily Demetrios Loll, MD      . traZODone (DESYREL) tablet 25 mg  25 mg Oral QHS PRN Mansy, Arvella Merles, MD         Discharge Medications: Please see discharge summary for a list of discharge medications.  Relevant Imaging Results:  Relevant Lab Results:   Additional Information SS#: [id number]  Candie Chroman, LCSW

## 2019-02-24 NOTE — Evaluation (Signed)
Physical Therapy Evaluation Patient Details Name: Jacob Moses MRN: [id number] DOB: June 10, 1927 Today's Date: 02/24/2019   History of Present Illness  Patient is 85 yo male s/p fall at home, CT+ bilateral subacute convexity subdural hematomas with 4 mm midline shift to the right, non surgical candidate at this time. PMH of HOH, vertigo, HTN, insomnia. Awaiting ortho consult for L wrist, NWB for this evaluation.    Clinical Impression  The patient was in bed, easily woken at start of session, family at bedside (daughter). Denied any pain at rest, oriented to self, situation, HOH, behavior WFLs. The patient reported that he lives in a mobile home alone with 5-6 steps to enter, previously independent in ADLs, drives, receives meals from family, neighbors, can cook. At least 2 major falls in the last 6 months, family believes there has been more.   Upon assessment the patient demonstrated generalized weakness of UE and LE. Unable to perform SLR without physical assist. Performed and educated about therapeutic exercises, encouraged to do several times a day to increase strength. Supine to sit with modA and extended time, progressed from [place] to supervision to sit EOB once bilateral feet supported. Sit <> stand with minAx2 and handheld assist/quad cane, NWB precautions maintained on LUE. Pt exhibited difficulty with foot clearance and standing balance. Overall the patient demonstrated deficits (see "PT Problem List") that impede the patient's functional abilities, safety, and mobility and would benefit from skilled PT intervention. Recommendation is STR due to patients current level of assistance needed, decreased caregiver support, and inaccessible home environment.      Follow Up Recommendations SNF    Equipment Recommendations  Other (comment)(TBD at next venue of care)    Recommendations for Other Services       Precautions / Restrictions Precautions Precautions: Fall Precaution Comments:  awaiting ortho consult for L wrist      Mobility  Bed Mobility Overal bed mobility: Needs Assistance Bed Mobility: Supine to Sit     Supine to sit: Mod assist;HOB elevated     General bed mobility comments: cues for sequencing to maximize participation  Transfers Overall transfer level: Needs assistance Equipment used: Quad cane;1 person hand held assist;2 person hand held assist Transfers: Sit to/from Stand Sit to Stand: Min assist;+2 physical assistance         General transfer comment: pt provided with quad cane, more comfortable with two person handheld assist. Pt assisted at L elbow to avoid weight bearing in L wrist.  Ambulation/Gait Ambulation/Gait assistance: Min assist Gait Distance (Feet): 2 Feet Assistive device: Quad cane;2 person hand held assist       General Gait Details: difficulty with R foot clearance, verbal cues consistently for sequencing. pt able to clear R foot with minAx2  Stairs            Wheelchair Mobility    Modified Rankin (Stroke Patients Only)       Balance Overall balance assessment: Needs assistance Sitting-balance support: Feet supported Sitting balance-Leahy Scale: Fair Sitting balance - Comments: Pt progressed from modA to supervision to sit EOB with feet supported     Standing balance-Leahy Scale: Poor                               Pertinent Vitals/Pain Pain Assessment: No/denies pain    Home Living Family/patient expects to be discharged to:: Private residence Living Arrangements: Alone Available Help at Discharge: Family;Friend(s);Available PRN/intermittently Type of Home: Mobile  home Home Access: Stairs to enter Entrance Stairs-Rails: Right Entrance Stairs-Number of Steps: 5-6 Home Layout: One level Home Equipment: [place] - 2 wheels;Cane - single point Additional Comments: at least 2 major falls, daughter believes that there has been more    Prior Function Level of Independence: Independent          Comments: drives, walks 1.5 miles a day     Hand Dominance   Dominant Hand: Right    Extremity/Trunk Assessment   Upper Extremity Assessment Upper Extremity Assessment: Generalized weakness;LUE deficits/detail;RUE deficits/detail RUE Deficits / Details: able to lift against gravity, squeeze hands LUE Deficits / Details: able to lift against gravity, squeeze hands    Lower Extremity Assessment Lower Extremity Assessment: RLE deficits/detail;LLE deficits/detail RLE Deficits / Details: unable to perform SLR without  physical assist, able to perform LAQ in sitting LLE Deficits / Details: unable to perform SLR without physical assit, able to perform LAQ in sitting    Cervical / Trunk Assessment Cervical / Trunk Assessment: Kyphotic  Communication   Communication: HOH  Cognition Arousal/Alertness: Awake/alert Behavior During Therapy: WFL for tasks assessed/performed Overall Cognitive Status: Within Functional Limits for tasks assessed                                        General Comments      Exercises General Exercises - Lower Extremity Ankle Circles/Pumps: AROM;Both;5 reps Quad Sets: AROM;Strengthening;Both;10 reps Long Arc Quad: AROM;Strengthening;Both;5 reps;Seated Heel Slides: AROM;AAROM;Strengthening;Both;5 reps Hip ABduction/ADduction: AAROM;Strengthening;Both;5 reps Straight Leg Raises: AAROM;Strengthening;Both;5 reps   Assessment/Plan    PT Assessment Patient needs continued PT services  PT Problem List Decreased strength;Decreased mobility;Decreased safety awareness;Decreased range of motion;Decreased knowledge of precautions;Decreased activity tolerance;Decreased balance;Decreased knowledge of use of DME       PT Treatment Interventions DME instruction;Therapeutic exercise;Gait training;Balance training;Stair training;Neuromuscular re-education;Functional mobility training;Therapeutic activities;Patient/family education    PT  Goals (Current goals can be found in the Care Plan section)  Acute Rehab PT Goals Patient Stated Goal: to return to PLOF PT Goal Formulation: With patient Time For Goal Achievement: 03/10/19    Frequency 7X/week   Barriers to discharge Inaccessible home environment;Decreased caregiver support      Co-evaluation               AM-PAC PT "6 Clicks" Mobility  Outcome Measure Help needed turning from your back to your side while in a flat bed without using bedrails?: A Lot Help needed moving from lying on your back to sitting on the side of a flat bed without using bedrails?: A Lot Help needed moving to and from a bed to a chair (including a wheelchair)?: A Lot Help needed standing up from a chair using your arms (e.g., wheelchair or bedside chair)?: A Lot Help needed to walk in hospital room?: A Lot Help needed climbing 3-5 steps with a railing? : Total 6 Click Score: 11    End of Session Equipment Utilized During Treatment: Gait belt Activity Tolerance: Patient tolerated treatment well Patient left: in chair;with chair alarm set;with nursing/sitter in room;with call bell/phone within reach;with family/visitor present Nurse Communication: Mobility status PT Visit Diagnosis: Muscle weakness (generalized) (M62.81);Other abnormalities of gait and mobility (R26.89);History of falling (Z91.81);Difficulty in walking, not elsewhere classified (R26.2)    Time: [id number] PT Time Calculation (min) (ACUTE ONLY): 40 min   Charges:   PT Evaluation $PT Eval Moderate Complexity: 1 Mod PT  Treatments $Therapeutic Exercise: 23-37 mins       Lieutenant Diego PT, DPT 11:21 AM,02/24/19 [phone]

## 2019-02-24 NOTE — Progress Notes (Signed)
OT Cancellation Note  Patient Details Name: Jacob Moses MRN: [id number] DOB: 1928-01-10   Cancelled Treatment:    Reason Eval/Treat Not Completed: Other (comment). Chart reviewed, pt still pending ortho consult. Will continue to hold and re-attempt at later date/time as pt is medically appropriate and pending updated plan of care.   Jeni Salles, MPH, MS, OTR/L ascom [phone] 02/24/19, 8:28 AM

## 2019-02-24 NOTE — TOC Initial Note (Signed)
Transition of Care [facility]) - Initial/Assessment Note    Patient Details  Name: Jacob Moses MRN: [id number] Date of Birth: 11-04-1927  Transition of Care [facility]) CM/SW Contact:    Candie Chroman, LCSW Phone Number: 02/24/2019, 4:09 PM  Clinical Narrative: CSW met with patient. Daughter at bedside. CSW introduced role and explained that PT recommendations would be discussed. Patient and his daughter are agreeable to SNF placement. Provided list of CMS scores for facilities within 25 miles of zip code. Only preferences on that list are [facility] and [facility]. Patient prefers to stay in [facility]. [place] admissions coordinator will review referral. They are unable to take weekend admissions. Daughter is aware. Will notify daughter as well. No further concerns. CSW encouraged patient and his daughter to contact CSW as needed. CSW will continue to follow patient and his daughter for support and facilitate discharge to SNF once medically stable.    Expected Discharge Plan: Skilled Nursing Facility Barriers to Discharge: Continued Medical Work up   Patient Goals and CMS Choice   CMS Medicare.gov Compare Post Acute Care list provided to:: Patient(Daughter at bedside)    Expected Discharge Plan and Services Expected Discharge Plan: [place] Choice: [place] arrangements for the past 2 months: Single Family Home                                      Prior Living Arrangements/Services Living arrangements for the past 2 months: Single Family Home Lives with:: Self Patient language and need for interpreter reviewed:: Yes Do you feel safe going back to the place where you live?: Yes      Need for Family Participation in Patient Care: Yes (Comment) Care giver support system in place?: Yes (comment)   Criminal Activity/Legal Involvement Pertinent to Current Situation/Hospitalization: No - Comment  as needed  Activities of Daily Living      Permission Sought/Granted Permission sought to share information with : Facility Sport and exercise psychologist, Family Supports Permission granted to share information with : Yes, Verbal Permission Granted  Share Information with NAME: [name]  Permission granted to share info w AGENCY: SNF's  Permission granted to share info w Relationship: Daughter  Permission granted to share info w Contact Information: [phone]  Emotional Assessment Appearance:: Appears stated age Attitude/Demeanor/Rapport: Engaged, Gracious Affect (typically observed): Accepting, Calm, Appropriate, Pleasant Orientation: : Oriented to Self, Oriented to Place, Oriented to  Time, Oriented to Situation Alcohol / Substance Use: Never Used Psych Involvement: No (comment)  Admission diagnosis:  Subdural hematoma ([place]) [S06.5X9A] Fall, initial encounter [W19.XXXA] Patient Active Problem List   Diagnosis Date Noted  . SDH (subdural hematoma) ([place]) 02/22/2019   PCP:  Albina Billet, MD Pharmacy:   [facility], [place] [address] Phone: [phone] Fax: [phone]     Social Determinants of Health (SDOH) Interventions    Readmission Risk Interventions No flowsheet data found.

## 2019-02-24 NOTE — Progress Notes (Signed)
Dr. Bridgett Larsson notified of ortho consult. Dr. Rudene Christians to see patient tomorrow per Dr. Bridgett Larsson.

## 2019-02-24 NOTE — Progress Notes (Signed)
   02/24/19 1400  Clinical Encounter Type  Visited With Patient and family together  Visit Type Follow-up  Ch went in to complete AD document. There were no available notaries at this moment. Ch will try again tomorrow.

## 2019-02-24 NOTE — Progress Notes (Signed)
    Attending Progress Note  History: Jacob Moses is here for evaluation for falls and a L subdural hematoma.  He was seen by my partner Cathleen Fears 02/22/19 in consultation.    On my review, the patient denies any new symptoms.  He denies current headache, but did have a mild headache earlier today.  He otherwise feels well.  He was able to participate in physical therapy today.   Physical Exam: Vitals:   02/24/19 0900 02/24/19 1600  BP: (!) 147/67 140/78  Pulse: 60 (!) 54  Resp: 16 18  Temp: 98 F (36.7 C) 98 F (36.7 C)  SpO2: 96% 100%    AA Ox3 CNI Mild R drift Strength:5/5 throughout BUE and BLE SILT   Data:  Recent Labs  Lab 02/22/19 1305 02/23/19 0514 02/24/19 0358  NA 139 141 138  K 5.3* 4.7 4.4  CL 105 109 109  CO2 23 24 21*  BUN 30* 35* 35*  CREATININE 1.18 1.36* 1.16  GLUCOSE 108* 99 102*  CALCIUM 9.5 8.8* 8.1*   Recent Labs  Lab 02/23/19 0514  AST 85*  ALT 25  ALKPHOS 75     Recent Labs  Lab 02/22/19 1305 02/23/19 0514  WBC 16.6* 11.7*  HGB 12.6* 11.3*  HCT 39.0 35.5*  PLT 310 279   Recent Labs  Lab 02/23/19 0514  APTT 43*  INR 1.1         Other tests/results: 02/22/19 CT reviewed - 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  He has mild symptoms from this fluid collection.  - continue PTOT - Plan placement with hope that subdural will resorb with time - Other care per primary team - Once discharged, we will arrange follow up at [facility].  - If patient declines, please rescan and contact me to review imaging  Meade Maw MD, [facility] Department of Neurosurgery

## 2019-02-25 DIAGNOSIS — G4700 Insomnia, unspecified: Secondary | ICD-10-CM | POA: Insufficient documentation

## 2019-02-25 LAB — CK: Total CK: 654 U/L — ABNORMAL HIGH (ref 49–397)

## 2019-02-25 MED ORDER — TRAZODONE HCL 50 MG PO TABS: 25.0000 mg | ORAL_TABLET | Freq: Every evening | ORAL | Status: DC | PRN

## 2019-02-25 MED ORDER — PANTOPRAZOLE SODIUM 40 MG PO TBEC: 40.0000 mg | DELAYED_RELEASE_TABLET | Freq: Every day | ORAL | Status: DC

## 2019-02-25 MED ORDER — AMLODIPINE BESYLATE 5 MG PO TABS: 5.0000 mg | ORAL_TABLET | Freq: Every day | ORAL | Status: DC

## 2019-02-25 MED ORDER — GABAPENTIN 100 MG PO CAPS: 100.0000 mg | ORAL_CAPSULE | Freq: Every day | ORAL | Status: DC

## 2019-02-25 MED ORDER — ALPRAZOLAM 1 MG PO TABS: 1.0000 mg | ORAL_TABLET | Freq: Every day | ORAL | Status: DC

## 2019-02-25 NOTE — Care Management Important Message (Signed)
Important Message  Patient Details  Name: Jacob Moses MRN: [id number] Date of Birth: 09-07-1927   Medicare Important Message Given:  Yes     Juliann Pulse A Jiovanny Burdell 02/25/2019, 10:58 AM

## 2019-02-25 NOTE — Consult Note (Signed)
Reason for Consult: Left distal radius and ulna fracture Referring Physician: [name] is an 85 y.o. male.  HPI: Patient suffered a distal radius fracture approximately 2 months ago is being treated nonoperatively secondary to him not being medically cleared for ORIF.  I am asked to see him with regards to weightbearing status on the left distal radius with potential use for a walker using the left arm  Past Medical History:  Diagnosis Date  . H/O vertigo   . History of stomach ulcers   . HOH (hard of hearing)   . Hypertension   . Insomnia   . Wears dentures     Past Surgical History:  Procedure Laterality Date  . [place]  . [place]  2010  . ECTROPION REPAIR Bilateral 04/24/2015   Procedure: REPAIR OF ECTROPION TARSAL WEDGE LEFT EYE REPAIR EXTENSIVE BILATERAL;  Surgeon: Karle Starch, MD;  Location: [place];  Service: Ophthalmology;  Laterality: Bilateral;  . FRACTURE SURGERY Left    arm  . [place]  . LACRIMAL DUCT EXPLORATION Bilateral 04/24/2015   Procedure: LACRIMAL DUCT EXPLORATION PUNCTOPLASTY;  Surgeon: Karle Starch, MD;  Location: [place];  Service: Ophthalmology;  Laterality: Bilateral;  . SEPTOPLASTY    . STOMACH SURGERY  1966   ulcer    Family History  Problem Relation Age of Onset  . Cancer Mother   . Diabetes Father     Social History:  reports that he quit smoking about 40 years ago. He quit after 30.00 years of use. He has never used smokeless tobacco. He reports that he does not drink alcohol or use drugs.  Allergies: No Known Allergies  Medications: I have reviewed the patient's current medications.  Results for orders placed or performed during the hospital encounter of 02/22/19 (from the past 48 hour(s))  CK     Status: Abnormal   Collection Time: 02/24/19  3:58 AM  Result Value Ref Range   Total CK 1,122 (H) 49 - 397 U/L    Comment: Performed at  [facility], [address] [place], [place] [id number]  Basic metabolic panel     Status: Abnormal   Collection Time: 02/24/19  3:58 AM  Result Value Ref Range   Sodium 138 135 - 145 mmol/L   Potassium 4.4 3.5 - 5.1 mmol/L   Chloride 109 98 - 111 mmol/L   CO2 21 (L) 22 - 32 mmol/L   Glucose, Bld 102 (H) 70 - 99 mg/dL   BUN 35 (H) 8 - 23 mg/dL   Creatinine, Ser 1.16 0.61 - 1.24 mg/dL   Calcium 8.1 (L) 8.9 - 10.3 mg/dL   GFR calc non Af Amer 56 (L) >60 mL/min   GFR calc Af Amer >60 >60 mL/min   Anion gap 8 5 - 15    Comment: Performed at [facility], [address] [place], [place] [address]  CK     Status: Abnormal   Collection Time: 02/25/19  4:05 AM  Result Value Ref Range   Total CK 654 (H) 49 - 397 U/L    Comment: Performed at [facility], [address] [place], [place] [address]    No results found.  ROS Blood pressure 133/73, pulse 63, temperature 98.4 F (36.9 C), temperature source Oral, resp. rate 18, height 5\' 11"  (1.803 m), weight 57.9 kg, SpO2 98 %. Physical Exam he has some deformity  to the wrist with shortening of the radius and prominent ulna.  He is nontender to palpation and percussion.  Sensation is been intact.  Radiographs were reviewed and they show radiolucent line still present at the fracture site but clinically healed  Assessment/Plan: Impression is that this is a distal radius fracture that is adequately healed for weightbearing.  Recommendation is weightbearing as tolerated on the left wrist with or without wrist brace for comfort may require platform attachment to be able bear full weight on the left side okay to get up with a walker.  Hessie Knows 02/25/2019, 11:46 AM

## 2019-02-25 NOTE — Progress Notes (Signed)
Pt has been accepted at [facility]. He is ready to be D/C today. Met with pt and daughter and made them aware of D/C plan to [facility] today. Leslie with [facility] made aware that pt is ready to be D/C today. D/C summary faxed to facility. Provided RN with facility # to call report.  

## 2019-02-25 NOTE — Progress Notes (Signed)
Physical Therapy Treatment Patient Details Name: Jacob Moses MRN: [id number] DOB: 06-20-1927 Today's Date: 02/25/2019    History of Present Illness Patient is 85 yo male s/p fall at home, CT+ bilateral subacute convexity subdural hematomas with 4 mm midline shift to the right, non surgical candidate at this time. PMH of HOH, vertigo, HTN, insomnia. Awaiting ortho consult for L wrist, NWB for this treatment.    PT Comments    Patient alert, up in chair, demeanor improved from prior session. Pt endorsed mild headache at start of session, towards end reported increased headache, about 6/10. The patient demonstrated improved mobility and progression towards goals this session. The patient was able to participate in seated therapeutic exercises, AAROM needed for RLE. sit <> stand, standing balance, and standing marching, performed twice this session. min-modA to maintain balance, improved ability to clear RLE with PT assist to shift weight bearing to L, intermittent L knee blocking for safety. Poor understanding/use of quad cane. Patient in chair with all needs in reach at end of session.  The patient would benefit from further skilled PT intervention to maximize mobility, safety, and independence.   Follow Up Recommendations  SNF     Equipment Recommendations  Other (comment)    Recommendations for Other Services       Precautions / Restrictions Precautions Precautions: Fall Precaution Comments: Per Dr. Rudene Christians at end of session, WBAT with brace as needed Restrictions Weight Bearing Restrictions: Yes LUE Weight Bearing: Weight bearing as tolerated    Mobility  Bed Mobility               General bed mobility comments: deferred up in chair  Transfers Overall transfer level: Needs assistance Equipment used: Quad cane;1 person hand held assist Transfers: Sit to/from Stand Sit to Stand: Mod assist         General transfer comment: verbal cues throughout for sequencing.  heavy modA to achieve standing and maintain initial standing balance. performed twice this session  Ambulation/Gait                 Stairs             Wheelchair Mobility    Modified Rankin (Stroke Patients Only)       Balance Overall balance assessment: Needs assistance Sitting-balance support: Feet supported Sitting balance-Leahy Scale: Fair       Standing balance-Leahy Scale: Poor Standing balance comment: reliant on PT, legs on chair, or UE support to remain upright                            Cognition Arousal/Alertness: Awake/alert Behavior During Therapy: WFL for tasks assessed/performed Overall Cognitive Status: Within Functional Limits for tasks assessed                                        Exercises General Exercises - Lower Extremity Long Arc Quad: AROM;Strengthening;Both;Seated;10 reps Hip Flexion/Marching: AAROM;10 reps;AROM;Strengthening;Left;Seated;Right Heel Raises: AROM;Both;10 reps;Strengthening;Seated Other Exercises Other Exercises: sit <> stand, standing balance, and standing marching, performed twice this session. min-modA to maintain balance, improved ability to clear RLE with PT assist to shift weight bearing to L, intermittent L knee blocking for safety    General Comments        Pertinent Vitals/Pain Pain Assessment: 0-10 Pain Score: 6  Pain Location: headache Pain Descriptors / Indicators: Aching Pain  Intervention(s): Limited activity within patient's tolerance;Monitored during session;Repositioned    Home Living                      Prior Function            PT Goals (current goals can now be found in the care plan section)      Frequency    7X/week      PT Plan Current plan remains appropriate    Co-evaluation              AM-PAC PT "6 Clicks" Mobility   Outcome Measure  Help needed turning from your back to your side while in a flat bed without using  bedrails?: A Lot Help needed moving from lying on your back to sitting on the side of a flat bed without using bedrails?: A Lot Help needed moving to and from a bed to a chair (including a wheelchair)?: A Lot Help needed standing up from a chair using your arms (e.g., wheelchair or bedside chair)?: A Lot Help needed to walk in hospital room?: A Lot Help needed climbing 3-5 steps with a railing? : Total 6 Click Score: 11    End of Session Equipment Utilized During Treatment: Gait belt Activity Tolerance: Patient tolerated treatment well Patient left: in chair;with chair alarm set;with call bell/phone within reach;with family/visitor present Nurse Communication: Mobility status PT Visit Diagnosis: Muscle weakness (generalized) (M62.81);Other abnormalities of gait and mobility (R26.89);History of falling (Z91.81);Difficulty in walking, not elsewhere classified (R26.2)     Time: [id number] PT Time Calculation (min) (ACUTE ONLY): 34 min  Charges:  $Therapeutic Exercise: 23-37 mins                     Lieutenant Diego PT, DPT [phone] PM,02/25/19 [phone]

## 2019-02-25 NOTE — Discharge Summary (Signed)
[facility] NAME: Jacob Moses    MR#:  [id number]  DATE OF BIRTH:  Feb 21, 1928  DATE OF ADMISSION:  02/22/2019   ADMITTING PHYSICIAN: Christel Mormon, MD  DATE OF DISCHARGE: 02/25/2019 PRIMARY CARE PHYSICIAN: Albina Billet, MD   ADMISSION DIAGNOSIS:  Subdural hematoma ([facility]) [id number] Fall, initial encounter [W19.XXXA] DISCHARGE DIAGNOSIS:  Active Problems:   SDH (subdural hematoma) (HCC)   Protein-calorie malnutrition, severe  SECONDARY DIAGNOSIS:   Past Medical History:  Diagnosis Date  . H/O vertigo   . History of stomach ulcers   . HOH (hard of hearing)   . Hypertension   . Insomnia   . Wears dentures    HOSPITAL COURSE:   1. Bilateral subacute subdural hematoma secondary to fall.  No surgical intervention, PT and OT per Dr. Cari Caraway.  2. Mild hyperkalemia with associated azotemia likely related to mild dehydration.  Potassium decreased from normal range. Improving with IV normal saline.   Rhabdomyolysis.    Improving with IV fluid.  3. Hypertension.  Norvasc po  and as needed IV labetalol.  4. Left wrist fracture. X-ray showed more impaction.  Pain management as needed.  Follow-up orthopedic consult.  5. History of peptic ulcer disease. On PPI therapy.  6. DVT prophylaxis. SCDs. Medical prophylaxis currently contraindicated due to his subdural hematomas. GI prophylaxis was addressed above.  Severe malnutrition.  Follow dietitian's recommendation. PT evaluation suggest skilled nursing facility placement.  DISCHARGE CONDITIONS:  Stable, discharge to skilled nursing home today. CONSULTS OBTAINED:  Treatment Team:  Hessie Knows, MD DRUG ALLERGIES:  No Known Allergies DISCHARGE MEDICATIONS:   Allergies as of 02/25/2019   No Known Allergies     Medication List    STOP taking these medications   ALPRAZolam 1 MG tablet Commonly known as: XANAX    diphenhydramine-acetaminophen 25-500 MG Tabs tablet Commonly known as: TYLENOL PM   traMADol 50 MG tablet Commonly known as: Ultram     TAKE these medications   amLODipine 5 MG tablet Commonly known as: NORVASC Take 1 tablet (5 mg total) by mouth daily.   cetirizine 10 MG tablet Commonly known as: ZYRTEC Take 10 mg by mouth daily after supper.   gabapentin 100 MG capsule Commonly known as: NEURONTIN Take 100 mg by mouth at bedtime.   pantoprazole 40 MG tablet Commonly known as: PROTONIX Take 1 tablet (40 mg total) by mouth daily.   traZODone 50 MG tablet Commonly known as: DESYREL Take 0.5 tablets (25 mg total) by mouth at bedtime as needed for sleep.        DISCHARGE INSTRUCTIONS:  See AVS.  If you experience worsening of your admission symptoms, develop shortness of breath, life threatening emergency, suicidal or homicidal thoughts you must seek medical attention immediately by calling 911 or calling your MD immediately  if symptoms less severe.  You Must read complete instructions/literature along with all the possible adverse reactions/side effects for all the Medicines you take and that have been prescribed to you. Take any new Medicines after you have completely understood and accpet all the possible adverse reactions/side effects.   Please note  You were cared for by a hospitalist during your hospital stay. If you have any questions about your discharge medications or the care you received while you were in the hospital after you are discharged, you can call the unit and asked to speak with the hospitalist on call if the hospitalist that took care of  you is not available. Once you are discharged, your primary care physician will handle any further medical issues. Please note that NO REFILLS for any discharge medications will be authorized once you are discharged, as it is imperative that you return to your primary care physician (or establish a relationship with a  primary care physician if you do not have one) for your aftercare needs so that they can reassess your need for medications and monitor your lab values.    On the day of Discharge:  VITAL SIGNS:  Blood pressure 133/73, pulse 63, temperature 98.4 F (36.9 C), temperature source Oral, resp. rate 18, height 5\' 11"  (1.803 m), weight 57.9 kg, SpO2 98 %. PHYSICAL EXAMINATION:  GENERAL:  85 y.o.-year-old patient lying in the bed with no acute distress.  Severe malnutrition. EYES: Pupils equal, round, reactive to light and accommodation. No scleral icterus. Extraocular muscles intact.  HEENT: Head atraumatic, normocephalic.  NECK:  Supple, no jugular venous distention. .  LUNGS: Normal breath sounds bilaterally, no wheezing, rales,rhonchi or crepitation. No use of accessory muscles of respiration.  CARDIOVASCULAR: S1, S2 normal. No murmurs, rubs, or gallops.  ABDOMEN: Soft, non-tender, non-distended. Bowel sounds present. No organomegaly or mass.  EXTREMITIES: No pedal edema, cyanosis, or clubbing.  NEUROLOGIC: Cranial nerves II through XII are intact. Muscle strength 3-4/5 in all extremities. Sensation intact. Gait not checked.  PSYCHIATRIC: The patient is alert and oriented x 3.  SKIN: No obvious rash, lesion, or ulcer.  DATA REVIEW:   CBC Recent Labs  Lab 02/23/19 0514  WBC 11.7*  HGB 11.3*  HCT 35.5*  PLT 279    Chemistries  Recent Labs  Lab 02/23/19 0514 02/24/19 0358  NA 141 138  K 4.7 4.4  CL 109 109  CO2 24 21*  GLUCOSE 99 102*  BUN 35* 35*  CREATININE 1.36* 1.16  CALCIUM 8.8* 8.1*  AST 85*  --   ALT 25  --   ALKPHOS 75  --   BILITOT 0.8  --      Microbiology Results  Results for orders placed or performed during the hospital encounter of 02/22/19  SARS Coronavirus 2 by RT PCR (hospital order, performed in [facility] hospital lab) Nasopharyngeal Nasopharyngeal Swab     Status: None   Collection Time: 02/22/19  3:48 PM   Specimen: Nasopharyngeal Swab  Result  Value Ref Range Status   SARS Coronavirus 2 NEGATIVE NEGATIVE Final    Comment: (NOTE) If result is NEGATIVE SARS-CoV-2 target nucleic acids are NOT DETECTED. The SARS-CoV-2 RNA is generally detectable in upper and lower  respiratory specimens during the acute phase of infection. The lowest  concentration of SARS-CoV-2 viral copies this assay can detect is 250  copies / mL. A negative result does not preclude SARS-CoV-2 infection  and should not be used as the sole basis for treatment or other  patient management decisions.  A negative result may occur with  improper specimen collection / handling, submission of specimen other  than nasopharyngeal swab, presence of viral mutation(s) within the  areas targeted by this assay, and inadequate number of viral copies  (<250 copies / mL). A negative result must be combined with clinical  observations, patient history, and epidemiological information. If result is POSITIVE SARS-CoV-2 target nucleic acids are DETECTED. The SARS-CoV-2 RNA is generally detectable in upper and lower  respiratory specimens dur ing the acute phase of infection.  Positive  results are indicative of active infection with SARS-CoV-2.  Clinical  correlation  with patient history and other diagnostic information is  necessary to determine patient infection status.  Positive results do  not rule out bacterial infection or co-infection with other viruses. If result is PRESUMPTIVE POSTIVE SARS-CoV-2 nucleic acids MAY BE PRESENT.   A presumptive positive result was obtained on the submitted specimen  and confirmed on repeat testing.  While 2019 novel coronavirus  (SARS-CoV-2) nucleic acids may be present in the submitted sample  additional confirmatory testing may be necessary for epidemiological  and / or clinical management purposes  to differentiate between  SARS-CoV-2 and other Sarbecovirus currently known to infect humans.  If clinically indicated additional testing  with an alternate test  methodology [phone]) is advised. The SARS-CoV-2 RNA is generally  detectable in upper and lower respiratory sp ecimens during the acute  phase of infection. The expected result is Negative. Fact Sheet for Patients:  StrictlyIdeas.no Fact Sheet for Healthcare Providers: BankingDealers.co.za This test is not yet approved or cleared by the [place] FDA and has been authorized for detection and/or diagnosis of SARS-CoV-2 by FDA under an Emergency Use Authorization (EUA).  This EUA will remain in effect (meaning this test can be used) for the duration of the COVID-19 declaration under Section 564(b)(1) of the Act, 21 U.S.C. section 360bbb-3(b)(1), unless the authorization is terminated or revoked sooner. Performed at [facility], [address] [place], [place] [address]   MRSA PCR Screening     Status: None   Collection Time: 02/22/19  9:00 PM   Specimen: Nasal Mucosa; Nasopharyngeal  Result Value Ref Range Status   MRSA by PCR NEGATIVE NEGATIVE Final    Comment:        The GeneXpert MRSA Assay (FDA approved for NASAL specimens only), is one component of a comprehensive MRSA colonization surveillance program. It is not intended to diagnose MRSA infection nor to guide or monitor treatment for MRSA infections. Performed at [facility], [address], [place],  [address]     RADIOLOGY:  No results found.   Management plans discussed with the patient, his daughter and they are in agreement.  CODE STATUS: DNR   TOTAL TIME TAKING CARE OF THIS PATIENT: 36 minutes.    Demetrios Loll M.D on 02/25/2019 at 12:11 PM  Between 7am to 6pm - Pager - [phone]  After 6pm go to [email or web address] - Proofreader  [facility]  Office  [phone]  CC: Primary care physician; Albina Billet, MD   Note: This dictation was prepared with Dragon  dictation along with smaller phrase technology. Any transcriptional errors that result from this process are unintentional.

## 2019-02-25 NOTE — TOC Progression Note (Signed)
Transition of Care [facility]) - Progression Note    Patient Details  Name: Jacob Moses MRN: [id number] Date of Birth: [birth year]  Transition of Care [facility]) CM/SW Contact  Shelbie Hutching, RN Phone Number: 02/25/2019, 8:46 AM  Clinical Narrative:    [facility] is not accepting any admissions at this time.  [facility] is reviewing the patient's therapy notes.  Ortho consult is pending.    Expected Discharge Plan: [place] Barriers to Discharge: Continued Medical Work up  Expected Discharge Plan and Services Expected Discharge Plan: [place] Choice: [place] arrangements for the past 2 months: Single Family Home                                       Social Determinants of Health (SDOH) Interventions    Readmission Risk Interventions No flowsheet data found.

## 2019-02-25 NOTE — Evaluation (Signed)
Occupational Therapy Evaluation Patient Details Name: Jacob Moses MRN: [id number] DOB: 31-Oct-1927 Today's Date: 02/25/2019    History of Present Illness Patient is 85 yo male s/p fall at home, CT+ bilateral subacute convexity subdural hematomas with 4 mm midline shift to the right, non surgical candidate at this time. PMH of HOH, vertigo, HTN, insomnia. Per ortho consult, brace for comfort and WBAT for L wrist.   Clinical Impression   Pt seen for OT evaluation this date. Prior to hospital admission, pt was independent, living by himself. He typically drives, has a life alert pendant, and was walking about 1.5 miles per day. Pt does endorse starting to not feel as well over the past 6 weeks or so. Daughter present for session. Currently pt demonstrates impairments in balance, strength globally (R slightly weaker), R side coordination (R dominant), and difficulty with multitasking, task termination, and problem solving. Pt is R hand dominant and now requires set up to [place] for self feeding and grooming tasks, mod/max assist for seated LB ADL and +2 min assist for functional mobility. Pt politely declined donning L wrist brace during meal but stated he would wear when he had to get up with the RW for comfort. Also wanted RN to assess L forearm skin before brace application. Pt would benefit from skilled OT to address noted impairments and functional limitations (see below for any additional details) in order to maximize safety and independence while minimizing falls risk and caregiver burden.  Upon hospital discharge, recommend pt discharge to STR to maximize safety and return to PLOF.    Follow Up Recommendations  SNF    Equipment Recommendations  3 in 1 bedside commode    Recommendations for Other Services       Precautions / Restrictions Precautions Precautions: Fall Precaution Comments: Per Dr. Rudene Christians ortho consult, WBAT with brace as needed L wrist Restrictions Weight Bearing  Restrictions: Yes LUE Weight Bearing: Weight bearing as tolerated      Mobility Bed Mobility               General bed mobility comments: deferred up in chair  Transfers Overall transfer level: Needs assistance Equipment used: Quad cane;1 person hand held assist Transfers: Sit to/from Stand Sit to Stand: Mod assist         General transfer comment: verbal cues throughout for sequencing. heavy modA to achieve standing and maintain initial standing balance. performed twice this session    Balance Overall balance assessment: Needs assistance Sitting-balance support: Feet supported Sitting balance-Leahy Scale: Fair       Standing balance-Leahy Scale: Poor Standing balance comment: reliant on PT, legs on chair, or UE support to remain upright                           ADL either performed or assessed with clinical judgement   ADL Overall ADL's : Needs assistance/impaired Eating/Feeding: Set up;Sitting Eating/Feeding Details (indicate cue type and reason): occasional cues, difficulty releasing fork when prompted, limited by low vision 2/2 cataracts Grooming: Minimal assistance Grooming Details (indicate cue type and reason): Min A for denture mgt                               General ADL Comments: would require Mod-Max A for LB ADL, Min A for UB ADL, Min-Mod A x1-2 for ADL transfers     Vision Baseline Vision/History: Cataracts  Patient Visual Report: No change from baseline Vision Assessment?: No apparent visual deficits     Perception     Praxis      Pertinent Vitals/Pain Pain Assessment: No/denies pain Pain Score: 6  Pain Location: headache Pain Descriptors / Indicators: Aching Pain Intervention(s): Limited activity within patient's tolerance;Monitored during session;Repositioned     Hand Dominance Right   Extremity/Trunk Assessment Upper Extremity Assessment Upper Extremity Assessment: RUE deficits/detail;Generalized  weakness RUE Deficits / Details: able to lift against gravity, squeeze hands, decreased FMC, impaired shoulder ROM (suspect at baseline) RUE Sensation: WNL RUE Coordination: decreased fine motor   Lower Extremity Assessment Lower Extremity Assessment: Generalized weakness;RLE deficits/detail RLE Deficits / Details: difficulty with  ankle DF   Cervical / Trunk Assessment Cervical / Trunk Assessment: Kyphotic   Communication Communication Communication: HOH   Cognition Arousal/Alertness: Awake/alert Behavior During Therapy: WFL for tasks assessed/performed Overall Cognitive Status: Impaired/Different from baseline Area of Impairment: Problem solving                             Problem Solving: Slow processing General Comments: Difficulty with task termination when using R hand   General Comments       Exercises  Other Exercises: self feeding and denture mgt tasks requiring set up to Min A to complete, difficulty with task termination, tends to weight shift torso forward versus reaching with R UE to access items on the far side of the tray   Shoulder Instructions      Home Living Family/patient expects to be discharged to:: Private residence Living Arrangements: Alone Available Help at Discharge: Family;Friend(s);Available PRN/intermittently Type of Home: Mobile home Home Access: Stairs to enter Entrance Stairs-Number of Steps: 5-6 Entrance Stairs-Rails: Right Home Layout: One level               Home Equipment: Walker - 2 wheels;Cane - single point   Additional Comments: at least 2 major falls, daughter believes that there has been more      Prior Functioning/Environment Level of Independence: Independent        Comments: drives, walks 1.5 miles a day        OT Problem List: Decreased strength;Decreased coordination;Decreased cognition;Impaired balance (sitting and/or standing);Impaired vision/perception;Impaired UE functional use      OT  Treatment/Interventions: Self-care/ADL training;Therapeutic exercise;Therapeutic activities;Cognitive remediation/compensation;Neuromuscular education;DME and/or AE instruction;Patient/family education;Balance training    OT Goals(Current goals can be found in the care plan section) Acute Rehab OT Goals Patient Stated Goal: to return to PLOF OT Goal Formulation: With patient/family Time For Goal Achievement: 03/11/19 Potential to Achieve Goals: Good ADL Goals Pt Will Perform Eating: with modified independence;with adaptive utensils;sitting(using dominant RUE primarily) Pt Will Perform Grooming: sitting;with min guard assist Pt Will Transfer to Toilet: ambulating;bedside commode;with min assist  OT Frequency: Min 2X/week   Barriers to D/C: Decreased caregiver support          Co-evaluation              AM-PAC OT "6 Clicks" Daily Activity     Outcome Measure Help from another person eating meals?: A Little Help from another person taking care of personal grooming?: A Little Help from another person toileting, which includes using toliet, bedpan, or urinal?: A Lot Help from another person bathing (including washing, rinsing, drying)?: A Lot Help from another person to put on and taking off regular upper body clothing?: A Lot Help from another person to put on and  taking off regular lower body clothing?: A Lot 6 Click Score: 14   End of Session    Activity Tolerance: Patient tolerated treatment well Patient left: in chair;with call bell/phone within reach;with chair alarm set;with nursing/sitter in room;with family/visitor present  OT Visit Diagnosis: Other abnormalities of gait and mobility (R26.89);Repeated falls (R29.6);Muscle weakness (generalized) (M62.81);Other symptoms and signs involving cognitive function                Time: 1321-1404 OT Time Calculation (min): 43 min Charges:  OT General Charges $OT Visit: 1 Visit OT Evaluation $OT Eval Moderate Complexity: 1  Mod OT Treatments $Self Care/Home Management : 23-37 mins  Jeni Salles, MPH, MS, OTR/L ascom [phone] 02/25/19, 2:39 PM

## 2019-02-28 DIAGNOSIS — F0390 Unspecified dementia without behavioral disturbance: Secondary | ICD-10-CM | POA: Insufficient documentation

## 2019-04-05 ENCOUNTER — Other Ambulatory Visit: Payer: Self-pay | Admitting: Neurosurgery

## 2019-04-05 DIAGNOSIS — S065XAA Traumatic subdural hemorrhage with loss of consciousness status unknown, initial encounter: Secondary | ICD-10-CM

## 2019-04-05 DIAGNOSIS — S065X9A Traumatic subdural hemorrhage with loss of consciousness of unspecified duration, initial encounter: Secondary | ICD-10-CM

## 2019-04-06 DIAGNOSIS — R600 Localized edema: Secondary | ICD-10-CM | POA: Insufficient documentation

## 2019-04-19 ENCOUNTER — Other Ambulatory Visit: Payer: Self-pay

## 2019-04-19 ENCOUNTER — Ambulatory Visit
Admission: RE | Admit: 2019-04-19 | Discharge: 2019-04-19 | Disposition: A | Discharge status: Home / Self Care | Payer: Medicare Other | Source: Ambulatory Visit | Attending: Neurosurgery | Admitting: Neurosurgery

## 2019-04-19 DIAGNOSIS — S065X9A Traumatic subdural hemorrhage with loss of consciousness of unspecified duration, initial encounter: Secondary | ICD-10-CM | POA: Diagnosis not present

## 2019-04-19 DIAGNOSIS — S065XAA Traumatic subdural hemorrhage with loss of consciousness status unknown, initial encounter: Secondary | ICD-10-CM

## 2019-05-18 DIAGNOSIS — I351 Nonrheumatic aortic (valve) insufficiency: Secondary | ICD-10-CM | POA: Insufficient documentation

## 2019-09-05 ENCOUNTER — Encounter: Payer: Self-pay | Admitting: Ophthalmology

## 2019-09-09 ENCOUNTER — Other Ambulatory Visit
Admission: RE | Admit: 2019-09-09 | Discharge: 2019-09-09 | Disposition: A | Discharge status: Home / Self Care | Payer: Medicare Other | Source: Ambulatory Visit | Attending: Ophthalmology | Admitting: Ophthalmology

## 2019-09-09 DIAGNOSIS — Z20822 Contact with and (suspected) exposure to covid-19: Secondary | ICD-10-CM | POA: Insufficient documentation

## 2019-09-09 DIAGNOSIS — Z01812 Encounter for preprocedural laboratory examination: Secondary | ICD-10-CM | POA: Insufficient documentation

## 2019-09-09 LAB — SARS CORONAVIRUS 2 (TAT 6-24 HRS): SARS Coronavirus 2: NEGATIVE

## 2019-09-09 NOTE — Discharge Instructions (Signed)

## 2019-09-13 ENCOUNTER — Encounter: Payer: Self-pay | Admitting: Ophthalmology

## 2019-09-13 ENCOUNTER — Ambulatory Visit: Payer: Medicare Other | Admitting: Anesthesiology

## 2019-09-13 ENCOUNTER — Encounter
Admission: RE | Disposition: A | Discharge status: Home / Self Care | Payer: Self-pay | Source: Home / Self Care | Attending: Ophthalmology

## 2019-09-13 ENCOUNTER — Ambulatory Visit
Admission: RE | Admit: 2019-09-13 | Discharge: 2019-09-13 | Disposition: A | Discharge status: Home / Self Care | Payer: Medicare Other | Attending: Ophthalmology | Admitting: Ophthalmology

## 2019-09-13 ENCOUNTER — Other Ambulatory Visit: Payer: Self-pay

## 2019-09-13 DIAGNOSIS — R011 Cardiac murmur, unspecified: Secondary | ICD-10-CM | POA: Insufficient documentation

## 2019-09-13 DIAGNOSIS — D649 Anemia, unspecified: Secondary | ICD-10-CM | POA: Diagnosis not present

## 2019-09-13 DIAGNOSIS — I10 Essential (primary) hypertension: Secondary | ICD-10-CM | POA: Insufficient documentation

## 2019-09-13 DIAGNOSIS — Z87891 Personal history of nicotine dependence: Secondary | ICD-10-CM | POA: Diagnosis not present

## 2019-09-13 DIAGNOSIS — R600 Localized edema: Secondary | ICD-10-CM | POA: Insufficient documentation

## 2019-09-13 DIAGNOSIS — H2512 Age-related nuclear cataract, left eye: Secondary | ICD-10-CM | POA: Insufficient documentation

## 2019-09-13 HISTORY — PX: CATARACT EXTRACTION W/PHACO: SHX586

## 2019-09-13 SURGERY — PHACOEMULSIFICATION, CATARACT, WITH IOL INSERTION
Anesthesia: Monitor Anesthesia Care | Site: Eye | Laterality: Left

## 2019-09-13 MED ORDER — ARMC OPHTHALMIC DILATING DROPS
1.0000 "application " | OPHTHALMIC | Status: DC | PRN
  Administered 2019-09-13 (×3): 1 via OPHTHALMIC

## 2019-09-13 MED ORDER — BRIMONIDINE TARTRATE-TIMOLOL 0.2-0.5 % OP SOLN
OPHTHALMIC | Status: DC | PRN
  Administered 2019-09-13: 1 [drp] via OPHTHALMIC

## 2019-09-13 MED ORDER — NA CHONDROIT SULF-NA HYALURON 40-17 MG/ML IO SOLN
INTRAOCULAR | Status: DC | PRN
  Administered 2019-09-13: 1 mL via INTRAOCULAR

## 2019-09-13 MED ORDER — LIDOCAINE HCL (PF) 2 % IJ SOLN
INTRAOCULAR | Status: DC | PRN
  Administered 2019-09-13: 2 mL

## 2019-09-13 MED ORDER — FENTANYL CITRATE (PF) 100 MCG/2ML IJ SOLN
INTRAMUSCULAR | Status: DC | PRN
  Administered 2019-09-13: 50 ug via INTRAVENOUS

## 2019-09-13 MED ORDER — EPINEPHRINE PF 1 MG/ML IJ SOLN
INTRAOCULAR | Status: DC | PRN
  Administered 2019-09-13: 71 mL via OPHTHALMIC

## 2019-09-13 MED ORDER — TETRACAINE HCL 0.5 % OP SOLN
1.0000 [drp] | OPHTHALMIC | Status: DC | PRN
  Administered 2019-09-13 (×3): 1 [drp] via OPHTHALMIC

## 2019-09-13 MED ORDER — MOXIFLOXACIN HCL 0.5 % OP SOLN
OPHTHALMIC | Status: DC | PRN
  Administered 2019-09-13: 0.2 mL via OPHTHALMIC

## 2019-09-13 SURGICAL SUPPLY — 20 items
CANNULA ANT/CHMB 27G (MISCELLANEOUS) ×2 IMPLANT
CANNULA ANT/CHMB 27GA (MISCELLANEOUS) ×4 IMPLANT
GLOVE SURG LX 8.0 MICRO (GLOVE) ×1
GLOVE SURG LX STRL 8.0 MICRO (GLOVE) ×1 IMPLANT
GLOVE SURG TRIUMPH 8.0 PF LTX (GLOVE) ×2 IMPLANT
GOWN STRL REUS W/ TWL LRG LVL3 (GOWN DISPOSABLE) ×2 IMPLANT
GOWN STRL REUS W/TWL LRG LVL3 (GOWN DISPOSABLE) ×2
LENS IOL DIOP 21.0 (Intraocular Lens) ×2 IMPLANT
LENS IOL TECNIS MONO 21.0 (Intraocular Lens) IMPLANT
MARKER SKIN DUAL TIP RULER LAB (MISCELLANEOUS) ×2 IMPLANT
NDL FILTER BLUNT 18X1 1/2 (NEEDLE) ×1 IMPLANT
NEEDLE FILTER BLUNT 18X 1/2SAF (NEEDLE) ×1
NEEDLE FILTER BLUNT 18X1 1/2 (NEEDLE) ×1 IMPLANT
PACK EYE AFTER SURG (MISCELLANEOUS) ×2 IMPLANT
PACK OPTHALMIC (MISCELLANEOUS) ×2 IMPLANT
PACK PORFILIO (MISCELLANEOUS) ×2 IMPLANT
SYR 3ML LL SCALE MARK (SYRINGE) ×2 IMPLANT
SYR TB 1ML LUER SLIP (SYRINGE) ×2 IMPLANT
WATER STERILE IRR 250ML POUR (IV SOLUTION) ×2 IMPLANT
WIPE NON LINTING 3.25X3.25 (MISCELLANEOUS) ×2 IMPLANT

## 2019-09-13 NOTE — Anesthesia Postprocedure Evaluation (Signed)
Anesthesia Post Note  Patient: Jacob Moses  Procedure(s) Performed: CATARACT EXTRACTION PHACO AND INTRAOCULAR LENS PLACEMENT (IOC) LEFT  5.06  00:48.8 (Left Eye)     Anesthesia Post Evaluation  Ardeth Sportsman

## 2019-09-13 NOTE — Anesthesia Preprocedure Evaluation (Signed)
Anesthesia Evaluation  Patient identified by MRN, date of birth, ID band Patient awake    Reviewed: Allergy & Precautions, NPO status , Patient's Chart, lab work & pertinent test results  Airway Mallampati: II  TM Distance: >3 FB Neck ROM: Full    Dental  (+) Upper Dentures, Lower Dentures   Pulmonary former smoker,    Pulmonary exam normal        Cardiovascular hypertension, Normal cardiovascular exam     Neuro/Psych negative psych ROS   GI/Hepatic negative GI ROS, Neg liver ROS,   Endo/Other  negative endocrine ROS  Renal/GU negative Renal ROS  negative genitourinary   Musculoskeletal negative musculoskeletal ROS (+)   Abdominal Normal abdominal exam  (+)   Peds  Hematology negative hematology ROS (+)   Anesthesia Other Findings   Reproductive/Obstetrics negative OB ROS                             Anesthesia Physical Anesthesia Plan  ASA: II  Anesthesia Plan: MAC   Post-op Pain Management:    Induction: Intravenous  PONV Risk Score and Plan: 1 and TIVA, Midazolam and Treatment may vary due to age or medical condition  Airway Management Planned: Natural Airway and Nasal Cannula  Additional Equipment:   Intra-op Plan:   Post-operative Plan:   Informed Consent: I have reviewed the patients History and Physical, chart, labs and discussed the procedure including the risks, benefits and alternatives for the proposed anesthesia with the patient or authorized representative who has indicated his/her understanding and acceptance.     Dental advisory given  Plan Discussed with: CRNA  Anesthesia Plan Comments:         Anesthesia Quick Evaluation

## 2019-09-13 NOTE — Transfer of Care (Signed)
Immediate Anesthesia Transfer of Care Note  Patient: Jacob Moses  Procedure(s) Performed: CATARACT EXTRACTION PHACO AND INTRAOCULAR LENS PLACEMENT (IOC) LEFT  5.06  00:48.8 (Left Eye)  Patient Location: PACU  Anesthesia Type: MAC  Level of Consciousness: awake, alert  and patient cooperative  Airway and Oxygen Therapy: Patient Spontanous Breathing and Patient connected to supplemental oxygen  Post-op Assessment: Post-op Vital signs reviewed, Patient's Cardiovascular Status Stable, Respiratory Function Stable, Patent Airway and No signs of Nausea or vomiting  Post-op Vital Signs: Reviewed and stable  Complications: No apparent anesthesia complications

## 2019-09-13 NOTE — Op Note (Signed)
PREOPERATIVE DIAGNOSIS:  Nuclear sclerotic cataract of the left eye.   POSTOPERATIVE DIAGNOSIS:  Nuclear sclerotic cataract of the left eye.   OPERATIVE PROCEDURE:@   SURGEON:  Birder Robson, MD.   ANESTHESIA:  Anesthesiologist: Ardeth Sportsman, MD CRNA: Cameron Ali, CRNA  1.      Managed anesthesia care. 2.     0.82ml of Shugarcaine was instilled following the paracentesis   COMPLICATIONS:  None.   TECHNIQUE:   Stop and chop   DESCRIPTION OF PROCEDURE:  The patient was examined and consented in the preoperative holding area where the aforementioned topical anesthesia was applied to the left eye and then brought back to the Operating Room where the left eye was prepped and draped in the usual sterile ophthalmic fashion and a lid speculum was placed. A paracentesis was created with the side port blade and the anterior chamber was filled with viscoelastic. A near clear corneal incision was performed with the steel keratome. A continuous curvilinear capsulorrhexis was performed with a cystotome followed by the capsulorrhexis forceps. Hydrodissection and hydrodelineation were carried out with BSS on a blunt cannula. The lens was removed in a stop and chop  technique and the remaining cortical material was removed with the irrigation-aspiration handpiece. The capsular bag was inflated with viscoelastic and the Technis [id number] lens was placed in the capsular bag without complication. The remaining viscoelastic was removed from the eye with the irrigation-aspiration handpiece. The wounds were hydrated. The anterior chamber was flushed with BSS and the eye was inflated to physiologic pressure. 0.75ml Vigamox was placed in the anterior chamber. The wounds were found to be water tight. The eye was dressed with Combigan. The patient was given protective glasses to wear throughout the day and a shield with which to sleep tonight. The patient was also given drops with which to begin a drop regimen today and will  follow-up with me in one day. Implant Name Type Inv. Item Serial No. Manufacturer Lot No. LRB No. Used Action  LENS IOL DIOP 21.0 - [id number] Intraocular Lens LENS IOL DIOP 21.0 [id number]  Left 1 Implanted    Procedure(s): CATARACT EXTRACTION PHACO AND INTRAOCULAR LENS PLACEMENT (IOC) LEFT  5.06  00:48.8 (Left)  Electronically signed: Birder Robson 09/13/2019 10:45 AM

## 2019-09-13 NOTE — Anesthesia Procedure Notes (Signed)
Procedure Name: MAC Date/Time: 09/13/2019 10:25 AM Performed by: Cameron Ali, CRNA Pre-anesthesia Checklist: Patient identified, Emergency Drugs available, Suction available, Timeout performed and Patient being monitored Patient Re-evaluated:Patient Re-evaluated prior to induction Oxygen Delivery Method: Nasal cannula Placement Confirmation: positive ETCO2

## 2019-09-13 NOTE — H&P (Signed)
All labs reviewed. Abnormal studies sent to patients PCP when indicated.  Previous H&P reviewed, patient examined, there are NO CHANGES.  Jacob Mclees Porfilio5/11/202110:17 AM

## 2019-09-14 ENCOUNTER — Encounter: Payer: Self-pay | Admitting: *Deleted

## 2019-09-22 ENCOUNTER — Other Ambulatory Visit: Payer: Self-pay

## 2019-09-22 ENCOUNTER — Encounter: Payer: Self-pay | Admitting: Ophthalmology

## 2019-09-29 NOTE — Discharge Instructions (Signed)

## 2019-09-30 ENCOUNTER — Other Ambulatory Visit: Payer: Self-pay

## 2019-09-30 ENCOUNTER — Other Ambulatory Visit
Admission: RE | Admit: 2019-09-30 | Discharge: 2019-09-30 | Disposition: A | Discharge status: Home / Self Care | Payer: Medicare Other | Source: Ambulatory Visit | Attending: Ophthalmology | Admitting: Ophthalmology

## 2019-09-30 DIAGNOSIS — Z20822 Contact with and (suspected) exposure to covid-19: Secondary | ICD-10-CM | POA: Diagnosis not present

## 2019-09-30 DIAGNOSIS — Z01812 Encounter for preprocedural laboratory examination: Secondary | ICD-10-CM | POA: Insufficient documentation

## 2019-09-30 LAB — SARS CORONAVIRUS 2 (TAT 6-24 HRS): SARS Coronavirus 2: NEGATIVE

## 2019-10-04 ENCOUNTER — Ambulatory Visit: Payer: Medicare Other | Admitting: Anesthesiology

## 2019-10-04 ENCOUNTER — Encounter: Payer: Self-pay | Admitting: Ophthalmology

## 2019-10-04 ENCOUNTER — Other Ambulatory Visit: Payer: Self-pay

## 2019-10-04 ENCOUNTER — Ambulatory Visit
Admission: RE | Admit: 2019-10-04 | Discharge: 2019-10-04 | Disposition: A | Discharge status: Home / Self Care | Payer: Medicare Other | Attending: Ophthalmology | Admitting: Ophthalmology

## 2019-10-04 ENCOUNTER — Encounter
Admission: RE | Disposition: A | Discharge status: Home / Self Care | Payer: Self-pay | Source: Home / Self Care | Attending: Ophthalmology

## 2019-10-04 DIAGNOSIS — D649 Anemia, unspecified: Secondary | ICD-10-CM | POA: Insufficient documentation

## 2019-10-04 DIAGNOSIS — Z8619 Personal history of other infectious and parasitic diseases: Secondary | ICD-10-CM | POA: Insufficient documentation

## 2019-10-04 DIAGNOSIS — Z79899 Other long term (current) drug therapy: Secondary | ICD-10-CM | POA: Insufficient documentation

## 2019-10-04 DIAGNOSIS — I10 Essential (primary) hypertension: Secondary | ICD-10-CM | POA: Insufficient documentation

## 2019-10-04 DIAGNOSIS — K219 Gastro-esophageal reflux disease without esophagitis: Secondary | ICD-10-CM | POA: Insufficient documentation

## 2019-10-04 DIAGNOSIS — H2511 Age-related nuclear cataract, right eye: Secondary | ICD-10-CM | POA: Diagnosis not present

## 2019-10-04 DIAGNOSIS — Z87891 Personal history of nicotine dependence: Secondary | ICD-10-CM | POA: Insufficient documentation

## 2019-10-04 HISTORY — PX: CATARACT EXTRACTION W/PHACO: SHX586

## 2019-10-04 SURGERY — PHACOEMULSIFICATION, CATARACT, WITH IOL INSERTION
Anesthesia: Monitor Anesthesia Care | Site: Eye | Laterality: Right

## 2019-10-04 MED ORDER — BRIMONIDINE TARTRATE-TIMOLOL 0.2-0.5 % OP SOLN
OPHTHALMIC | Status: DC | PRN
  Administered 2019-10-04: 1 [drp] via OPHTHALMIC

## 2019-10-04 MED ORDER — MOXIFLOXACIN HCL 0.5 % OP SOLN
OPHTHALMIC | Status: DC | PRN
  Administered 2019-10-04: 0.2 mL via OPHTHALMIC

## 2019-10-04 MED ORDER — ACETAMINOPHEN 160 MG/5ML PO SOLN: 325.0000 mg | ORAL | Status: DC | PRN

## 2019-10-04 MED ORDER — FENTANYL CITRATE (PF) 100 MCG/2ML IJ SOLN
INTRAMUSCULAR | Status: DC | PRN
  Administered 2019-10-04: 50 ug via INTRAVENOUS

## 2019-10-04 MED ORDER — ARMC OPHTHALMIC DILATING DROPS
1.0000 "application " | OPHTHALMIC | Status: DC | PRN
  Administered 2019-10-04 (×3): 1 via OPHTHALMIC

## 2019-10-04 MED ORDER — ONDANSETRON HCL 4 MG/2ML IJ SOLN: 4.0000 mg | Freq: Once | INTRAMUSCULAR | Status: DC | PRN

## 2019-10-04 MED ORDER — NA CHONDROIT SULF-NA HYALURON 40-17 MG/ML IO SOLN
INTRAOCULAR | Status: DC | PRN
  Administered 2019-10-04: 1 mL via INTRAOCULAR

## 2019-10-04 MED ORDER — ACETAMINOPHEN 325 MG PO TABS: 325.0000 mg | ORAL_TABLET | ORAL | Status: DC | PRN

## 2019-10-04 MED ORDER — TETRACAINE HCL 0.5 % OP SOLN
1.0000 [drp] | OPHTHALMIC | Status: DC | PRN
  Administered 2019-10-04 (×3): 1 [drp] via OPHTHALMIC

## 2019-10-04 MED ORDER — EPINEPHRINE PF 1 MG/ML IJ SOLN
INTRAOCULAR | Status: DC | PRN
  Administered 2019-10-04: 81 mL via OPHTHALMIC

## 2019-10-04 MED ORDER — LIDOCAINE HCL (PF) 2 % IJ SOLN
INTRAOCULAR | Status: DC | PRN
  Administered 2019-10-04: 1 mL

## 2019-10-04 SURGICAL SUPPLY — 22 items
CANNULA ANT/CHMB 27G (MISCELLANEOUS) ×2 IMPLANT
CANNULA ANT/CHMB 27GA (MISCELLANEOUS) ×4 IMPLANT
GLOVE SURG LX 8.0 MICRO (GLOVE) ×2
GLOVE SURG LX STRL 8.0 MICRO (GLOVE) ×1 IMPLANT
GLOVE SURG TRIUMPH 8.0 PF LTX (GLOVE) ×2 IMPLANT
GOWN STRL REUS W/ TWL LRG LVL3 (GOWN DISPOSABLE) ×2 IMPLANT
GOWN STRL REUS W/TWL LRG LVL3 (GOWN DISPOSABLE) ×2
LENS IOL DIOP 20.5 (Intraocular Lens) ×2 IMPLANT
LENS IOL TECNIS MONO 20.5 (Intraocular Lens) IMPLANT
MARKER SKIN DUAL TIP RULER LAB (MISCELLANEOUS) ×2 IMPLANT
NDL FILTER BLUNT 18X1 1/2 (NEEDLE) ×1 IMPLANT
NEEDLE FILTER BLUNT 18X 1/2SAF (NEEDLE) ×1
NEEDLE FILTER BLUNT 18X1 1/2 (NEEDLE) ×1 IMPLANT
PACK EYE AFTER SURG (MISCELLANEOUS) ×2 IMPLANT
PACK OPTHALMIC (MISCELLANEOUS) ×2 IMPLANT
PACK PORFILIO (MISCELLANEOUS) ×2 IMPLANT
SUT ETHILON 10-0 CS-B-6CS-B-6 (SUTURE)
SUTURE EHLN 10-0 CS-B-6CS-B-6 (SUTURE) IMPLANT
SYR 3ML LL SCALE MARK (SYRINGE) ×2 IMPLANT
SYR TB 1ML LUER SLIP (SYRINGE) ×2 IMPLANT
WATER STERILE IRR 250ML POUR (IV SOLUTION) ×2 IMPLANT
WIPE NON LINTING 3.25X3.25 (MISCELLANEOUS) ×2 IMPLANT

## 2019-10-04 NOTE — Op Note (Signed)
PREOPERATIVE DIAGNOSIS:  Nuclear sclerotic cataract of the right eye.   POSTOPERATIVE DIAGNOSIS:  Cataract   OPERATIVE PROCEDURE:@   SURGEON:  Jacob Robson, MD.   ANESTHESIA:  Anesthesiologist: Veda Canning, MD CRNA: Jeannene Patella, CRNA  1.      Managed anesthesia care. 2.      0.36ml of Shugarcaine was instilled in the eye following the paracentesis.   COMPLICATIONS:  None.   TECHNIQUE:   Stop and chop   DESCRIPTION OF PROCEDURE:  The patient was examined and consented in the preoperative holding area where the aforementioned topical anesthesia was applied to the right eye and then brought back to the Operating Room where the right eye was prepped and draped in the usual sterile ophthalmic fashion and a lid speculum was placed. A paracentesis was created with the side port blade and the anterior chamber was filled with viscoelastic. A near clear corneal incision was performed with the steel keratome. A continuous curvilinear capsulorrhexis was performed with a cystotome followed by the capsulorrhexis forceps. Hydrodissection and hydrodelineation were carried out with BSS on a blunt cannula. The lens was removed in a stop and chop  technique and the remaining cortical material was removed with the irrigation-aspiration handpiece. The capsular bag was inflated with viscoelastic and the Technis [id number]  lens was placed in the capsular bag without complication. The remaining viscoelastic was removed from the eye with the irrigation-aspiration handpiece. The wounds were hydrated. The anterior chamber was flushed with BSS and the eye was inflated to physiologic pressure. 0.65ml of Vigamox was placed in the anterior chamber. The wounds were found to be water tight. The eye was dressed with Combigan. The patient was given protective glasses to wear throughout the day and a shield with which to sleep tonight. The patient was also given drops with which to begin a drop regimen today and will  follow-up with me in one day. Implant Name Type Inv. Item Serial No. Manufacturer Lot No. LRB No. Used Action  LENS IOL DIOP 20.5 - [id number] Intraocular Lens LENS IOL DIOP 20.5 [id number]  Right 1 Implanted   Procedure(s) with comments: CATARACT EXTRACTION PHACO AND INTRAOCULAR LENS PLACEMENT (IOC) RIGHT (Right) - 4.64 0:43.9  Electronically signed: Birder Moses 10/04/2019 9:47 AM

## 2019-10-04 NOTE — Anesthesia Preprocedure Evaluation (Signed)
Anesthesia Evaluation  Patient identified by MRN, date of birth, ID band Patient awake    Reviewed: Allergy & Precautions, NPO status , Patient's Chart, lab work & pertinent test results  Airway Mallampati: II  TM Distance: >3 FB Neck ROM: Full    Dental  (+) Upper Dentures, Lower Dentures   Pulmonary former smoker,    Pulmonary exam normal        Cardiovascular hypertension,  Rhythm:Regular Rate:Normal     Neuro/Psych    GI/Hepatic negative GI ROS,   Endo/Other    Renal/GU      Musculoskeletal   Abdominal Normal abdominal exam  (+)   Peds  Hematology   Anesthesia Other Findings   Reproductive/Obstetrics                             Anesthesia Physical  Anesthesia Plan  ASA: II  Anesthesia Plan: MAC   Post-op Pain Management:    Induction: Intravenous  PONV Risk Score and Plan: 1 and TIVA, Midazolam and Treatment may vary due to age or medical condition  Airway Management Planned: Natural Airway and Nasal Cannula  Additional Equipment:   Intra-op Plan:   Post-operative Plan:   Informed Consent: I have reviewed the patients History and Physical, chart, labs and discussed the procedure including the risks, benefits and alternatives for the proposed anesthesia with the patient or authorized representative who has indicated his/her understanding and acceptance.       Plan Discussed with: CRNA  Anesthesia Plan Comments:         Anesthesia Quick Evaluation

## 2019-10-04 NOTE — H&P (Signed)
All labs reviewed. Abnormal studies sent to patients PCP when indicated.  Previous H&P reviewed, patient examined, there are NO CHANGES.  Jacob Shedlock Porfilio6/1/20219:17 AM

## 2019-10-04 NOTE — Anesthesia Procedure Notes (Signed)
Procedure Name: MAC Date/Time: 10/04/2019 9:31 AM Performed by: Jeannene Patella, CRNA Pre-anesthesia Checklist: Patient identified, Emergency Drugs available, Suction available, Patient being monitored and Timeout performed Patient Re-evaluated:Patient Re-evaluated prior to induction Oxygen Delivery Method: Nasal cannula

## 2019-10-04 NOTE — Anesthesia Postprocedure Evaluation (Signed)
Anesthesia Post Note  Patient: Jacob Moses  Procedure(s) Performed: CATARACT EXTRACTION PHACO AND INTRAOCULAR LENS PLACEMENT (IOC) RIGHT (Right Eye)     Patient location during evaluation: PACU Anesthesia Type: MAC Level of consciousness: awake Pain management: pain level controlled Vital Signs Assessment: post-procedure vital signs reviewed and stable Respiratory status: respiratory function stable Cardiovascular status: stable Postop Assessment: no apparent nausea or vomiting Anesthetic complications: no    Veda Canning

## 2019-10-04 NOTE — Transfer of Care (Signed)
Immediate Anesthesia Transfer of Care Note  Patient: Jacob Moses  Procedure(s) Performed: CATARACT EXTRACTION PHACO AND INTRAOCULAR LENS PLACEMENT (IOC) RIGHT (Right Eye)  Patient Location: PACU  Anesthesia Type: MAC  Level of Consciousness: awake, alert  and patient cooperative  Airway and Oxygen Therapy: Patient Spontanous Breathing and Patient connected to supplemental oxygen  Post-op Assessment: Post-op Vital signs reviewed, Patient's Cardiovascular Status Stable, Respiratory Function Stable, Patent Airway and No signs of Nausea or vomiting  Post-op Vital Signs: Reviewed and stable  Complications: No apparent anesthesia complications

## 2019-10-05 ENCOUNTER — Encounter: Payer: Self-pay | Admitting: *Deleted

## 2019-12-30 ENCOUNTER — Encounter: Payer: Self-pay | Admitting: Emergency Medicine

## 2019-12-30 ENCOUNTER — Other Ambulatory Visit: Payer: Self-pay

## 2019-12-30 ENCOUNTER — Emergency Department: Payer: Medicare Other

## 2019-12-30 ENCOUNTER — Emergency Department
Admission: EM | Admit: 2019-12-30 | Discharge: 2019-12-30 | Disposition: A | Discharge status: Home / Self Care | Payer: Medicare Other | Attending: Student in an Organized Health Care Education/Training Program | Admitting: Student in an Organized Health Care Education/Training Program

## 2019-12-30 DIAGNOSIS — S0081XA Abrasion of other part of head, initial encounter: Secondary | ICD-10-CM | POA: Diagnosis not present

## 2019-12-30 DIAGNOSIS — Z87891 Personal history of nicotine dependence: Secondary | ICD-10-CM | POA: Insufficient documentation

## 2019-12-30 DIAGNOSIS — Y939 Activity, unspecified: Secondary | ICD-10-CM | POA: Insufficient documentation

## 2019-12-30 DIAGNOSIS — I10 Essential (primary) hypertension: Secondary | ICD-10-CM | POA: Diagnosis not present

## 2019-12-30 DIAGNOSIS — Y999 Unspecified external cause status: Secondary | ICD-10-CM | POA: Diagnosis not present

## 2019-12-30 DIAGNOSIS — S41111A Laceration without foreign body of right upper arm, initial encounter: Secondary | ICD-10-CM | POA: Diagnosis not present

## 2019-12-30 DIAGNOSIS — T07XXXA Unspecified multiple injuries, initial encounter: Secondary | ICD-10-CM

## 2019-12-30 DIAGNOSIS — W010XXA Fall on same level from slipping, tripping and stumbling without subsequent striking against object, initial encounter: Secondary | ICD-10-CM | POA: Diagnosis not present

## 2019-12-30 DIAGNOSIS — S41112A Laceration without foreign body of left upper arm, initial encounter: Secondary | ICD-10-CM | POA: Insufficient documentation

## 2019-12-30 DIAGNOSIS — Y9259 Other trade areas as the place of occurrence of the external cause: Secondary | ICD-10-CM | POA: Diagnosis not present

## 2019-12-30 DIAGNOSIS — S0990XA Unspecified injury of head, initial encounter: Secondary | ICD-10-CM

## 2019-12-30 DIAGNOSIS — Z79899 Other long term (current) drug therapy: Secondary | ICD-10-CM | POA: Insufficient documentation

## 2019-12-30 DIAGNOSIS — W19XXXA Unspecified fall, initial encounter: Secondary | ICD-10-CM

## 2019-12-30 LAB — CBC WITH DIFFERENTIAL/PLATELET
Abs Immature Granulocytes: 0.05 10*3/uL (ref 0.00–0.07)
Basophils Absolute: 0.1 10*3/uL (ref 0.0–0.1)
Basophils Relative: 0 %
Eosinophils Absolute: 0.4 10*3/uL (ref 0.0–0.5)
Eosinophils Relative: 3 %
HCT: 35.3 % — ABNORMAL LOW (ref 39.0–52.0)
Hemoglobin: 11.2 g/dL — ABNORMAL LOW (ref 13.0–17.0)
Immature Granulocytes: 0 %
Lymphocytes Relative: 12 %
Lymphs Abs: 1.5 10*3/uL (ref 0.7–4.0)
MCH: 31.5 pg (ref 26.0–34.0)
MCHC: 31.7 g/dL (ref 30.0–36.0)
MCV: 99.2 fL (ref 80.0–100.0)
Monocytes Absolute: 0.9 10*3/uL (ref 0.1–1.0)
Monocytes Relative: 7 %
Neutro Abs: 10 10*3/uL — ABNORMAL HIGH (ref 1.7–7.7)
Neutrophils Relative %: 78 %
Platelets: 285 10*3/uL (ref 150–400)
RBC: 3.56 MIL/uL — ABNORMAL LOW (ref 4.22–5.81)
RDW: 12.7 % (ref 11.5–15.5)
WBC: 13 10*3/uL — ABNORMAL HIGH (ref 4.0–10.5)
nRBC: 0 % (ref 0.0–0.2)

## 2019-12-30 LAB — PROTIME-INR
INR: 1 (ref 0.8–1.2)
Prothrombin Time: 13.2 seconds (ref 11.4–15.2)

## 2019-12-30 LAB — BASIC METABOLIC PANEL
Anion gap: 10 (ref 5–15)
BUN: 39 mg/dL — ABNORMAL HIGH (ref 8–23)
CO2: 24 mmol/L (ref 22–32)
Calcium: 9 mg/dL (ref 8.9–10.3)
Chloride: 105 mmol/L (ref 98–111)
Creatinine, Ser: 1.91 mg/dL — ABNORMAL HIGH (ref 0.61–1.24)
GFR calc Af Amer: 35 mL/min — ABNORMAL LOW (ref >60)
GFR calc non Af Amer: 30 mL/min — ABNORMAL LOW (ref >60)
Glucose, Bld: 109 mg/dL — ABNORMAL HIGH (ref 70–99)
Potassium: 5.4 mmol/L — ABNORMAL HIGH (ref 3.5–5.1)
Sodium: 139 mmol/L (ref 135–145)

## 2019-12-30 MED ORDER — ACETAMINOPHEN 325 MG PO TABS
650.0000 mg | ORAL_TABLET | Freq: Once | ORAL | Status: AC
  Administered 2019-12-30: 650 mg via ORAL
  Filled 2019-12-30: qty 2

## 2019-12-30 MED ORDER — LIDOCAINE VISCOUS HCL 2 % MT SOLN
15.0000 mL | Freq: Once | OROMUCOSAL | Status: AC
  Administered 2019-12-30: 15 mL via OROMUCOSAL
  Filled 2019-12-30 (×2): qty 15

## 2019-12-30 MED ORDER — BACITRACIN-NEOMYCIN-POLYMYXIN 400-5-5000 EX OINT
TOPICAL_OINTMENT | Freq: Once | CUTANEOUS | Status: AC
  Administered 2019-12-30: 1 via TOPICAL
  Filled 2019-12-30: qty 1

## 2019-12-30 NOTE — ED Provider Notes (Signed)
[facility] Emergency Department Provider Note ____________________________________________  Time seen: 1042  I have reviewed the triage vital signs and the nursing notes.  HISTORY  Chief Complaint  Fall  HPI Jacob Moses is a 86 y.o. male presents to the ED accompanied by his adult daughter, by EMS from a local mall.  Patient apparently walks the mall daily for exercise.  He had done so as per usual this morning, and apparently felt weak just prior to falling.  The report by EMS that he tripped over something in the mall, but the patient denies tripping.  He did fall face first, and presents now with an abrasion to the right side of the forehead, and skin tears to the bilateral upper extremities.  Patient denies any loss of consciousness, nausea, vomiting, or dizziness.  Also denies any nosebleed or dental injury.  Patient did have his normal level of activity and cognition since the incident.  The adult daughter confirms a recent diagnosis and admission for an acute subdural hematoma.  Patient had a mechanical fall about a month ago, and was evaluated at that time.   Past Medical History:  Diagnosis Date  . H/O vertigo   . History of stomach ulcers   . HOH (hard of hearing)   . Hypertension   . Insomnia   . Wears dentures     Patient Active Problem List   Diagnosis Date Noted  . Protein-calorie malnutrition, severe 02/24/2019  . SDH (subdural hematoma) ([place]) 02/22/2019    Past Surgical History:  Procedure Laterality Date  . [place]  . [place]  2010  . CATARACT EXTRACTION W/PHACO Left 09/13/2019   Procedure: CATARACT EXTRACTION PHACO AND INTRAOCULAR LENS PLACEMENT (IOC) LEFT  5.06  00:48.8;  Surgeon: Birder Robson, MD;  Location: [place];  Service: Ophthalmology;  Laterality: Left;  . CATARACT EXTRACTION W/PHACO Right 10/04/2019   Procedure: CATARACT EXTRACTION PHACO AND INTRAOCULAR LENS PLACEMENT  ([place]) RIGHT;  Surgeon: Birder Robson, MD;  Location: [place];  Service: Ophthalmology;  Laterality: Right;  4.64 0:43.9  . ECTROPION REPAIR Bilateral 04/24/2015   Procedure: REPAIR OF ECTROPION TARSAL WEDGE LEFT EYE REPAIR EXTENSIVE BILATERAL;  Surgeon: Karle Starch, MD;  Location: [place];  Service: Ophthalmology;  Laterality: Bilateral;  . [place]  . LACRIMAL DUCT EXPLORATION Bilateral 04/24/2015   Procedure: LACRIMAL DUCT EXPLORATION PUNCTOPLASTY;  Surgeon: Karle Starch, MD;  Location: [place];  Service: Ophthalmology;  Laterality: Bilateral;  . SEPTOPLASTY    . STOMACH SURGERY  1966   ulcer    Prior to Admission medications   Medication Sig Start Date End Date Taking? Authorizing Provider  ALPRAZolam (XANAX XR) 0.5 MG 24 hr tablet Take 0.5 mg by mouth daily.    [provider]  amLODipine (NORVASC) 5 MG tablet Take 1 tablet (5 mg total) by mouth daily. 02/25/19   Demetrios Loll, MD  cetirizine (ZYRTEC) 10 MG tablet Take 10 mg by mouth daily after supper.    [provider]  Ferrous Sulfate (IRON PO) Take by mouth daily.    [provider]  gabapentin (NEURONTIN) 100 MG capsule Take 100 mg by mouth at bedtime.     [provider]  melatonin 3 MG TABS tablet Take 3 mg by mouth at bedtime as needed.    [provider]  pantoprazole (PROTONIX) 40 MG tablet Take 1 tablet (40 mg total) by mouth  daily. 02/25/19   Demetrios Loll, MD  traZODone (DESYREL) 50 MG tablet Take 0.5 tablets (25 mg total) by mouth at bedtime as needed for sleep. 02/25/19   Demetrios Loll, MD    Allergies Patient has no known allergies.  Family History  Problem Relation Age of Onset  . Cancer Mother   . Diabetes Father     Social History Social History   Tobacco Use  . Smoking status: Former Smoker    Years: 30.00    Quit date: 05/05/1978    Years since quitting: 41.6  . Smokeless tobacco: Never Used  Vaping  Use  . Vaping Use: Never used  Substance Use Topics  . Alcohol use: No  . Drug use: No    Review of Systems  Constitutional: Negative for fever. Eyes: Negative for visual changes. ENT: Negative for sore throat. Cardiovascular: Negative for chest pain. Respiratory: Negative for shortness of breath. Gastrointestinal: Negative for abdominal pain, vomiting and diarrhea. Musculoskeletal: Negative for back pain. Skin: Negative for rash.  Multiple skin tears as above. Neurological: Negative for headaches, focal weakness or numbness. ____________________________________________  PHYSICAL EXAM:  VITAL SIGNS: ED Triage Vitals [12/30/19 1027]  Enc Vitals Group     BP (!) 178/90     Pulse Rate 72     Resp 18     Temp 98 F (36.7 C)     Temp Source Oral     SpO2 100 %     Weight 128 lb 1.4 oz (58.1 kg)     Height 5\' 11"  (1.803 m)     Head Circumference      Peak Flow      Pain Score      Pain Loc      Pain Edu?      Excl. in [place]?     Constitutional: Alert and oriented. Well appearing and in no distress.  GCS = 15 Head: Normocephalic and atraumatic, except for superficial abrasion to the right side of the forehead and scalp.. Eyes: Conjunctivae are normal. PERRL. Normal extraocular movements Ears: Canals clear. TMs intact bilaterally. Nose: No congestion/rhinorrhea/epistaxis. Mouth/Throat: Mucous membranes are moist.  Edentulous without any injury to the lips. Neck: Supple.  Normal range of motion without crepitus or distracting midline tenderness. Cardiovascular: Normal rate, regular rhythm. Normal distal pulses. Respiratory: Normal respiratory effort. No wheezes/rales/rhonchi. Gastrointestinal: Soft and nontender. No distention. Musculoskeletal: Nontender with normal range of motion in all extremities.  Neurologic:  Normal gait without ataxia. Normal speech and language. No gross focal neurologic deficits are appreciated. Skin:  Skin is warm, dry and intact. No rash  noted. Psychiatric: Mood and affect are normal. Patient exhibits appropriate insight and judgment. ____________________________________________    LABS (pertinent positives/negatives) Labs Reviewed  BASIC METABOLIC PANEL - Abnormal; Notable for the following components:      Result Value   Potassium 5.4 (*)    Glucose, Bld 109 (*)    BUN 39 (*)    Creatinine, Ser 1.91 (*)    GFR calc non Af Amer 30 (*)    GFR calc Af Amer 35 (*)    All other components within normal limits  CBC WITH DIFFERENTIAL/PLATELET - Abnormal; Notable for the following components:   WBC 13.0 (*)    RBC 3.56 (*)    Hemoglobin 11.2 (*)    HCT 35.3 (*)    Neutro Abs 10.0 (*)    All other components within normal limits  PROTIME-INR  ____________________________________________   RADIOLOGY CT Head w/o  CM/CT Cervical Spine/CT Maxillofacial   IMPRESSION: 1. There is a redemonstrated subdural hematoma about the frontal left hemisphere, which is significantly reduced in size compared to prior examination, now measuring at most 1.2 cm in thickness, previously up to 2.4 cm in thickness with significant mass effect on the left hemisphere at that time. There is no significant mass effect or midline shift on current examination. There is however heterogeneous internal hyperattenuation consistent with acute or subacute hemorrhage superimposed upon chronic subdural hygroma. Consider short interval follow-up imaging to ensure stability at 4-8 hours. 2. Small-vessel white matter disease in keeping with advanced patient age. 3. No displaced fracture or dislocation of the facial bones. 4. No fracture or static subluxation of the cervical spine. 5. Ankylosis throughout the cervical spine, involving the included levels below C2.  These results were called by telephone at the time of interpretation on 12/30/2019 at 11:32 am to provider Chasmine Lender PA, who verbally acknowledged these results.  CT Head w/o CM -  interval IMPRESSION: 1. Stable left subdural collection over the frontal region from earlier today. Similar degree of internal heterogeneity with hyperattenuation that is mildly redistributing but otherwise unchanged. No progression. No significant mass effect or midline shift. 2. Thin right subdural collection overlying the highest convexity measures up to 2 mm, unchanged from earlier today and improved from December exam. 3. No subarachnoid hemorrhage or new intracranial bleed. 4. Background chronic small vessel ischemia and atrophy.  DG Left Shoulder IMPRESSION: No fracture or dislocation. 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  DG Left Humerus IMPRESSION: No fracture or dislocation.  No evident arthropathy.  DG Left Elbow IMPRESSION: No fracture or dislocation.  No evident arthropathy. ____________________________________________  PROCEDURES  Tylenol 650 mg PO Viscous lido 2% topical  ..Laceration Repair  Date/Time: 12/30/2019 5:30 PM Performed by: Melvenia Needles, PA-C Authorized by: Melvenia Needles, PA-C   Consent:    Consent obtained:  Verbal   Consent given by:  Patient   Risks discussed:  Pain and poor wound healing Anesthesia (see MAR for exact dosages):    Anesthesia method:  Topical application   Topical anesthetic:  Lidocaine gel Laceration details:    Location:  Shoulder/arm   Shoulder/arm location:  R lower arm   Length (cm):  4   Depth (mm):  4 Repair type:    Repair type:  Simple Pre-procedure details:    Preparation:  Patient was prepped and draped in usual sterile fashion Treatment:    Area cleansed with:  Saline   Amount of cleaning:  Standard   Irrigation solution:  Sterile saline Skin repair:    Repair method:  Tissue adhesive Approximation:    Approximation:  Close Post-procedure details:    Dressing:  Non-adherent dressing   Patient tolerance of procedure:  Tolerated well, no immediate  complications   ____________________________________________  INITIAL IMPRESSION / ASSESSMENT AND PLAN / ED COURSE  ----------------------------------------- 11:31 PM on 12/30/2019 ----------------------------------------- S/w Dr. Laqueta Carina (RAD): he called critical result of acute/subacute on chronic SDH from current trauma. He suggest repeat interval head CT in 4-8 hours. No other acute findings on CT.   ----------------------------------------- 12:33 PM on 12/30/2019 ----------------------------------------- Patient and adult daughter notified of results. Agreeable to stay for repeat imaging.   ----------------------------------------- 4:18 PM on 12/30/2019 ----------------------------------------- Call to Dr. Lacinda Axon (Neuro[facility]): he will review the CT results and call back. He anticipates discharge home as patient is clinically/hemodynamically stable and has a stable SDH by report.   DDX:  SDH/SAH, facial fractures, cervical spine fracture, UE fractures  Geriatric patient with ED evaluation of injury sustained following a mechanical fall.  Patient reports feeling weak prior to the fall but denies syncope.  His exam is overall benign and reassuring at this time.  He has been stable throughout his stay in the ED.  Imaging performed including CT and plain film images did not reveal any acute findings with the exception of an acute on chronic small subdural hematoma.  Interval imaging confirms that the subdural bleed is stable at this time with no progression.  The chronic pre-existing SDH is also improved from previous imaging.  Patient is inclined to discharge at this time, and he will follow-up with neurosurgery as an outpatient as discussed.  Wound care has been provided and instructions as well as supplies have been given to the adult daughter.  Patient will follow up as discussed and return to the ED if needed.  Jacob Moses was evaluated in Emergency Department on 12/30/2019 for the  symptoms described in the history of present illness. He was evaluated in the context of the global COVID-19 pandemic, which necessitated consideration that the patient might be at risk for infection with the SARS-CoV-2 virus that causes COVID-19. Institutional protocols and algorithms that pertain to the evaluation of patients at risk for COVID-19 are in a state of rapid change based on information released by regulatory bodies including the CDC and federal and state organizations. These policies and algorithms were followed during the patient's care in the ED. ____________________________________________  FINAL CLINICAL IMPRESSION(S) / ED DIAGNOSES  Final diagnoses:  Fall, initial encounter  Minor head injury, initial encounter  Multiple abrasions      Carmie End, Dannielle Karvonen, PA-C 12/30/19 1901    Merlyn Lot, MD 12/31/19 [phone]

## 2019-12-30 NOTE — ED Triage Notes (Signed)
Brought by ems for fall after tripping over something at the mall.  He fell face first.  No loc.  Abrasion to head and skin tears to arms.

## 2019-12-30 NOTE — Discharge Instructions (Addendum)
Your exam, labs, XRs and CT scans are all normal or stable at this time. There is no evidence of any worsening of your previous brain bleed. You have remained stable and are cleared to follow-up with Dr. Deetta Perla in the office. Call next week to schedule an appointment. Discontinue any OTC aspirin therapy at this time.

## 2019-12-30 NOTE — ED Notes (Signed)
See triage note  Presents s/p fall  States he became weak while he was walking out of mall   [place]  Abrasions/skin tears noted to both knees,forehead and both arms    Small hematoma noted to right side of head  Having pain to left elbow area  No LOC

## 2020-05-05 DIAGNOSIS — F1011 Alcohol abuse, in remission: Secondary | ICD-10-CM | POA: Insufficient documentation

## 2020-05-05 DIAGNOSIS — M810 Age-related osteoporosis without current pathological fracture: Secondary | ICD-10-CM | POA: Insufficient documentation

## 2020-11-29 ENCOUNTER — Inpatient Hospital Stay: Payer: Medicare Other | Admitting: Anesthesiology

## 2020-11-29 ENCOUNTER — Encounter
Admission: EM | Disposition: A | Discharge status: Skilled Nursing Facility | Payer: Self-pay | Source: Home / Self Care | Attending: Internal Medicine

## 2020-11-29 ENCOUNTER — Inpatient Hospital Stay: Payer: Medicare Other

## 2020-11-29 ENCOUNTER — Other Ambulatory Visit: Payer: Self-pay

## 2020-11-29 ENCOUNTER — Inpatient Hospital Stay
Admission: EM | Admit: 2020-11-29 | Discharge: 2020-12-03 | DRG: 522 | Disposition: A | Discharge status: Skilled Nursing Facility | Payer: Medicare Other | Attending: Internal Medicine | Admitting: Internal Medicine

## 2020-11-29 ENCOUNTER — Emergency Department: Payer: Medicare Other

## 2020-11-29 DIAGNOSIS — S72001A Fracture of unspecified part of neck of right femur, initial encounter for closed fracture: Principal | ICD-10-CM | POA: Diagnosis present

## 2020-11-29 DIAGNOSIS — D509 Iron deficiency anemia, unspecified: Secondary | ICD-10-CM | POA: Diagnosis present

## 2020-11-29 DIAGNOSIS — Y92019 Unspecified place in single-family (private) house as the place of occurrence of the external cause: Secondary | ICD-10-CM

## 2020-11-29 DIAGNOSIS — Z66 Do not resuscitate: Secondary | ICD-10-CM | POA: Diagnosis present

## 2020-11-29 DIAGNOSIS — R682 Dry mouth, unspecified: Secondary | ICD-10-CM | POA: Diagnosis not present

## 2020-11-29 DIAGNOSIS — H9190 Unspecified hearing loss, unspecified ear: Secondary | ICD-10-CM | POA: Diagnosis present

## 2020-11-29 DIAGNOSIS — Z419 Encounter for procedure for purposes other than remedying health state, unspecified: Secondary | ICD-10-CM

## 2020-11-29 DIAGNOSIS — W010XXA Fall on same level from slipping, tripping and stumbling without subsequent striking against object, initial encounter: Secondary | ICD-10-CM | POA: Diagnosis present

## 2020-11-29 DIAGNOSIS — W19XXXA Unspecified fall, initial encounter: Secondary | ICD-10-CM

## 2020-11-29 DIAGNOSIS — E871 Hypo-osmolality and hyponatremia: Secondary | ICD-10-CM | POA: Diagnosis present

## 2020-11-29 DIAGNOSIS — R001 Bradycardia, unspecified: Secondary | ICD-10-CM | POA: Diagnosis present

## 2020-11-29 DIAGNOSIS — N1832 Chronic kidney disease, stage 3b: Secondary | ICD-10-CM | POA: Diagnosis present

## 2020-11-29 DIAGNOSIS — I452 Bifascicular block: Secondary | ICD-10-CM | POA: Diagnosis present

## 2020-11-29 DIAGNOSIS — I10 Essential (primary) hypertension: Secondary | ICD-10-CM | POA: Diagnosis present

## 2020-11-29 DIAGNOSIS — Z87891 Personal history of nicotine dependence: Secondary | ICD-10-CM | POA: Diagnosis not present

## 2020-11-29 DIAGNOSIS — F419 Anxiety disorder, unspecified: Secondary | ICD-10-CM | POA: Diagnosis present

## 2020-11-29 DIAGNOSIS — Z96649 Presence of unspecified artificial hip joint: Secondary | ICD-10-CM

## 2020-11-29 DIAGNOSIS — D72828 Other elevated white blood cell count: Secondary | ICD-10-CM | POA: Diagnosis not present

## 2020-11-29 DIAGNOSIS — K219 Gastro-esophageal reflux disease without esophagitis: Secondary | ICD-10-CM | POA: Diagnosis present

## 2020-11-29 DIAGNOSIS — S72009A Fracture of unspecified part of neck of unspecified femur, initial encounter for closed fracture: Secondary | ICD-10-CM | POA: Diagnosis not present

## 2020-11-29 DIAGNOSIS — I129 Hypertensive chronic kidney disease with stage 1 through stage 4 chronic kidney disease, or unspecified chronic kidney disease: Secondary | ICD-10-CM | POA: Diagnosis present

## 2020-11-29 DIAGNOSIS — R110 Nausea: Secondary | ICD-10-CM | POA: Diagnosis not present

## 2020-11-29 DIAGNOSIS — K5900 Constipation, unspecified: Secondary | ICD-10-CM | POA: Diagnosis not present

## 2020-11-29 DIAGNOSIS — Z79899 Other long term (current) drug therapy: Secondary | ICD-10-CM | POA: Diagnosis not present

## 2020-11-29 DIAGNOSIS — G4700 Insomnia, unspecified: Secondary | ICD-10-CM | POA: Diagnosis present

## 2020-11-29 DIAGNOSIS — Y92009 Unspecified place in unspecified non-institutional (private) residence as the place of occurrence of the external cause: Secondary | ICD-10-CM | POA: Diagnosis not present

## 2020-11-29 DIAGNOSIS — Z8711 Personal history of peptic ulcer disease: Secondary | ICD-10-CM

## 2020-11-29 DIAGNOSIS — N183 Chronic kidney disease, stage 3 unspecified: Secondary | ICD-10-CM | POA: Diagnosis present

## 2020-11-29 DIAGNOSIS — E875 Hyperkalemia: Secondary | ICD-10-CM | POA: Diagnosis not present

## 2020-11-29 DIAGNOSIS — Z20822 Contact with and (suspected) exposure to covid-19: Secondary | ICD-10-CM | POA: Diagnosis present

## 2020-11-29 DIAGNOSIS — Z833 Family history of diabetes mellitus: Secondary | ICD-10-CM | POA: Diagnosis not present

## 2020-11-29 DIAGNOSIS — Z01818 Encounter for other preprocedural examination: Secondary | ICD-10-CM

## 2020-11-29 HISTORY — PX: HIP ARTHROPLASTY: SHX981

## 2020-11-29 LAB — CBC WITH DIFFERENTIAL/PLATELET
Abs Immature Granulocytes: 0.04 K/uL (ref 0.00–0.07)
Basophils Absolute: 0 K/uL (ref 0.0–0.1)
Basophils Relative: 0 %
Eosinophils Absolute: 0.1 K/uL (ref 0.0–0.5)
Eosinophils Relative: 1 %
HCT: 35.9 % — ABNORMAL LOW (ref 39.0–52.0)
Hemoglobin: 11.7 g/dL — ABNORMAL LOW (ref 13.0–17.0)
Immature Granulocytes: 1 %
Lymphocytes Relative: 18 %
Lymphs Abs: 1.5 K/uL (ref 0.7–4.0)
MCH: 32.6 pg (ref 26.0–34.0)
MCHC: 32.6 g/dL (ref 30.0–36.0)
MCV: 100 fL (ref 80.0–100.0)
Monocytes Absolute: 0.8 K/uL (ref 0.1–1.0)
Monocytes Relative: 10 %
Neutro Abs: 5.8 K/uL (ref 1.7–7.7)
Neutrophils Relative %: 70 %
Platelets: 269 K/uL (ref 150–400)
RBC: 3.59 MIL/uL — ABNORMAL LOW (ref 4.22–5.81)
RDW: 12 % (ref 11.5–15.5)
WBC: 8.2 K/uL (ref 4.0–10.5)
nRBC: 0 % (ref 0.0–0.2)

## 2020-11-29 LAB — PROTIME-INR
INR: 1.1 (ref 0.8–1.2)
Prothrombin Time: 14.2 seconds (ref 11.4–15.2)

## 2020-11-29 LAB — TYPE AND SCREEN
ABO/RH(D): O NEG
Antibody Screen: NEGATIVE

## 2020-11-29 LAB — BASIC METABOLIC PANEL WITH GFR
Anion gap: 12 (ref 5–15)
BUN: 31 mg/dL — ABNORMAL HIGH (ref 8–23)
CO2: 22 mmol/L (ref 22–32)
Calcium: 9 mg/dL (ref 8.9–10.3)
Chloride: 102 mmol/L (ref 98–111)
Creatinine, Ser: 1.7 mg/dL — ABNORMAL HIGH (ref 0.61–1.24)
GFR, Estimated: 38 mL/min — ABNORMAL LOW
Glucose, Bld: 115 mg/dL — ABNORMAL HIGH (ref 70–99)
Potassium: 4.7 mmol/L (ref 3.5–5.1)
Sodium: 136 mmol/L (ref 135–145)

## 2020-11-29 LAB — RESP PANEL BY RT-PCR (FLU A&B, COVID) ARPGX2
Influenza A by PCR: NEGATIVE
Influenza B by PCR: NEGATIVE
SARS Coronavirus 2 by RT PCR: NEGATIVE

## 2020-11-29 LAB — APTT: aPTT: 35 seconds (ref 24–36)

## 2020-11-29 LAB — TROPONIN I (HIGH SENSITIVITY): Troponin I (High Sensitivity): 9 ng/L

## 2020-11-29 SURGERY — HEMIARTHROPLASTY, HIP, DIRECT ANTERIOR APPROACH, FOR FRACTURE
Anesthesia: Spinal | Site: Hip | Laterality: Right

## 2020-11-29 MED ORDER — ACETAMINOPHEN 10 MG/ML IV SOLN
INTRAVENOUS | Status: DC | PRN
  Administered 2020-11-29: 1000 mg via INTRAVENOUS

## 2020-11-29 MED ORDER — OXYCODONE HCL 5 MG PO TABS
2.5000 mg | ORAL_TABLET | ORAL | Status: DC | PRN
  Administered 2020-11-30: 5 mg via ORAL
  Filled 2020-11-29 (×2): qty 1

## 2020-11-29 MED ORDER — CEFAZOLIN SODIUM-DEXTROSE 1-4 GM/50ML-% IV SOLN
1.0000 g | Freq: Two times a day (BID) | INTRAVENOUS | Status: AC
  Administered 2020-11-29 – 2020-11-30 (×2): 1 g via INTRAVENOUS
  Filled 2020-11-29 (×2): qty 50

## 2020-11-29 MED ORDER — FENTANYL CITRATE (PF) 100 MCG/2ML IJ SOLN
25.0000 ug | Freq: Once | INTRAMUSCULAR | Status: AC
  Administered 2020-11-29: 25 ug via INTRAVENOUS
  Filled 2020-11-29: qty 2

## 2020-11-29 MED ORDER — ACETAMINOPHEN 500 MG PO TABS: 1000.0000 mg | ORAL_TABLET | Freq: Once | ORAL | Status: DC

## 2020-11-29 MED ORDER — ENOXAPARIN SODIUM 40 MG/0.4ML IJ SOSY: 40.0000 mg | PREFILLED_SYRINGE | INTRAMUSCULAR | Status: DC

## 2020-11-29 MED ORDER — PHENOL 1.4 % MT LIQD
1.0000 | OROMUCOSAL | Status: DC | PRN
  Filled 2020-11-29 (×2): qty 177

## 2020-11-29 MED ORDER — SUGAMMADEX SODIUM 200 MG/2ML IV SOLN
INTRAVENOUS | Status: DC | PRN
  Administered 2020-11-29 (×3): 50 mg via INTRAVENOUS

## 2020-11-29 MED ORDER — HYDROMORPHONE HCL 1 MG/ML IJ SOLN: 0.2000 mg | INTRAMUSCULAR | Status: DC | PRN

## 2020-11-29 MED ORDER — ROCURONIUM BROMIDE 10 MG/ML (PF) SYRINGE
PREFILLED_SYRINGE | INTRAVENOUS | Status: AC
  Filled 2020-11-29: qty 10

## 2020-11-29 MED ORDER — SODIUM CHLORIDE 0.9 % IV SOLN: Freq: Once | INTRAVENOUS | Status: AC

## 2020-11-29 MED ORDER — SODIUM CHLORIDE 0.9 % IV SOLN: Freq: Once | INTRAVENOUS | Status: DC

## 2020-11-29 MED ORDER — TRAMADOL HCL 50 MG PO TABS
50.0000 mg | ORAL_TABLET | Freq: Four times a day (QID) | ORAL | Status: DC | PRN
Start: 2020-11-29 — End: 2020-12-03
  Administered 2020-12-01 – 2020-12-03 (×3): 50 mg via ORAL
  Filled 2020-11-29 (×2): qty 1

## 2020-11-29 MED ORDER — ONDANSETRON HCL 4 MG/2ML IJ SOLN
4.0000 mg | Freq: Three times a day (TID) | INTRAMUSCULAR | Status: DC | PRN
Start: 2020-11-29 — End: 2020-11-29
  Administered 2020-11-29: 4 mg via INTRAVENOUS

## 2020-11-29 MED ORDER — DEXAMETHASONE SODIUM PHOSPHATE 10 MG/ML IJ SOLN
INTRAMUSCULAR | Status: AC
  Filled 2020-11-29: qty 1

## 2020-11-29 MED ORDER — FENTANYL CITRATE (PF) 100 MCG/2ML IJ SOLN
INTRAMUSCULAR | Status: AC
  Filled 2020-11-29: qty 2

## 2020-11-29 MED ORDER — METHOCARBAMOL 500 MG PO TABS
500.0000 mg | ORAL_TABLET | Freq: Three times a day (TID) | ORAL | Status: DC | PRN
Start: 2020-11-29 — End: 2020-11-29
  Filled 2020-11-29: qty 1

## 2020-11-29 MED ORDER — ALPRAZOLAM ER 0.5 MG PO TB24
0.5000 mg | ORAL_TABLET | Freq: Every day | ORAL | Status: DC
  Administered 2020-11-30: 0.5 mg via ORAL
  Filled 2020-11-29 (×2): qty 1

## 2020-11-29 MED ORDER — GLYCOPYRROLATE 0.2 MG/ML IJ SOLN
INTRAMUSCULAR | Status: DC | PRN
  Administered 2020-11-29: .2 mg via INTRAVENOUS

## 2020-11-29 MED ORDER — PROPOFOL 10 MG/ML IV BOLUS
INTRAVENOUS | Status: AC
  Filled 2020-11-29: qty 20

## 2020-11-29 MED ORDER — OXYCODONE HCL 5 MG PO TABS
5.0000 mg | ORAL_TABLET | ORAL | Status: DC | PRN
  Administered 2020-11-29: 10 mg via ORAL
  Administered 2020-11-30 – 2020-12-02 (×3): 5 mg via ORAL
  Filled 2020-11-29 (×2): qty 1
  Filled 2020-11-29: qty 2

## 2020-11-29 MED ORDER — PHENYLEPHRINE HCL (PRESSORS) 10 MG/ML IV SOLN
INTRAVENOUS | Status: DC | PRN
  Administered 2020-11-29 (×2): 50 ug via INTRAVENOUS

## 2020-11-29 MED ORDER — ONDANSETRON HCL 4 MG/2ML IJ SOLN
4.0000 mg | Freq: Once | INTRAMUSCULAR | Status: AC
  Administered 2020-11-29: 4 mg via INTRAVENOUS
  Filled 2020-11-29: qty 2

## 2020-11-29 MED ORDER — ONDANSETRON HCL 4 MG/2ML IJ SOLN
INTRAMUSCULAR | Status: AC
  Filled 2020-11-29: qty 2

## 2020-11-29 MED ORDER — ACETAMINOPHEN 325 MG PO TABS
650.0000 mg | ORAL_TABLET | Freq: Four times a day (QID) | ORAL | Status: DC | PRN
Start: 2020-11-29 — End: 2020-11-29

## 2020-11-29 MED ORDER — TRANEXAMIC ACID-NACL 1000-0.7 MG/100ML-% IV SOLN
INTRAVENOUS | Status: AC
  Filled 2020-11-29: qty 100

## 2020-11-29 MED ORDER — CEFAZOLIN SODIUM-DEXTROSE 1-4 GM/50ML-% IV SOLN
1.0000 g | Freq: Once | INTRAVENOUS | Status: AC
  Administered 2020-11-29: 1 g via INTRAVENOUS
  Filled 2020-11-29: qty 50

## 2020-11-29 MED ORDER — TRANEXAMIC ACID-NACL 1000-0.7 MG/100ML-% IV SOLN
INTRAVENOUS | Status: AC
  Administered 2020-11-29: 1000 mg via INTRAVENOUS
  Filled 2020-11-29: qty 100

## 2020-11-29 MED ORDER — OXYCODONE-ACETAMINOPHEN 5-325 MG PO TABS
1.0000 | ORAL_TABLET | ORAL | Status: DC | PRN
Start: 2020-11-29 — End: 2020-11-29

## 2020-11-29 MED ORDER — DEXAMETHASONE SODIUM PHOSPHATE 10 MG/ML IJ SOLN
INTRAMUSCULAR | Status: DC | PRN
  Administered 2020-11-29: 10 mg via INTRAVENOUS

## 2020-11-29 MED ORDER — FENTANYL CITRATE (PF) 100 MCG/2ML IJ SOLN: 25.0000 ug | INTRAMUSCULAR | Status: DC | PRN

## 2020-11-29 MED ORDER — SODIUM CHLORIDE 0.9 % IR SOLN
Status: DC | PRN
  Administered 2020-11-29: 3000 mL

## 2020-11-29 MED ORDER — ACETAMINOPHEN 500 MG PO TABS
1000.0000 mg | ORAL_TABLET | Freq: Three times a day (TID) | ORAL | Status: AC
  Administered 2020-11-29 – 2020-11-30 (×4): 1000 mg via ORAL
  Filled 2020-11-29 (×3): qty 2

## 2020-11-29 MED ORDER — ONDANSETRON HCL 4 MG PO TABS: 4.0000 mg | ORAL_TABLET | Freq: Four times a day (QID) | ORAL | Status: DC | PRN

## 2020-11-29 MED ORDER — METOCLOPRAMIDE HCL 10 MG PO TABS
5.0000 mg | ORAL_TABLET | Freq: Three times a day (TID) | ORAL | Status: DC | PRN
Start: 2020-11-29 — End: 2020-12-03

## 2020-11-29 MED ORDER — MORPHINE SULFATE (PF) 2 MG/ML IV SOLN
1.0000 mg | INTRAVENOUS | Status: DC | PRN
Start: 2020-11-29 — End: 2020-11-29

## 2020-11-29 MED ORDER — ROCURONIUM BROMIDE 100 MG/10ML IV SOLN
INTRAVENOUS | Status: DC | PRN
  Administered 2020-11-29 (×2): 10 mg via INTRAVENOUS
  Administered 2020-11-29: 40 mg via INTRAVENOUS

## 2020-11-29 MED ORDER — HYDROCODONE-ACETAMINOPHEN 7.5-325 MG PO TABS: 1.0000 | ORAL_TABLET | Freq: Once | ORAL | Status: DC | PRN

## 2020-11-29 MED ORDER — HYDRALAZINE HCL 20 MG/ML IJ SOLN: 5.0000 mg | INTRAMUSCULAR | Status: DC | PRN

## 2020-11-29 MED ORDER — BUPIVACAINE LIPOSOME 1.3 % IJ SUSP
INTRAMUSCULAR | Status: DC | PRN
  Administered 2020-11-29: 50 mL

## 2020-11-29 MED ORDER — SODIUM CHLORIDE 0.9 % IV SOLN: INTRAVENOUS | Status: DC

## 2020-11-29 MED ORDER — TRAZODONE HCL 50 MG PO TABS
25.0000 mg | ORAL_TABLET | Freq: Every evening | ORAL | Status: DC | PRN
  Administered 2020-11-29: 25 mg via ORAL
  Filled 2020-11-29: qty 1

## 2020-11-29 MED ORDER — ONDANSETRON HCL 4 MG/2ML IJ SOLN
4.0000 mg | Freq: Four times a day (QID) | INTRAMUSCULAR | Status: DC | PRN
  Administered 2020-11-30 – 2020-12-03 (×4): 4 mg via INTRAVENOUS
  Filled 2020-11-29 (×3): qty 2

## 2020-11-29 MED ORDER — LACTATED RINGERS IV SOLN: INTRAVENOUS | Status: DC | PRN

## 2020-11-29 MED ORDER — FLEET ENEMA 7-19 GM/118ML RE ENEM: 1.0000 | ENEMA | Freq: Once | RECTAL | Status: DC | PRN

## 2020-11-29 MED ORDER — PROPOFOL 10 MG/ML IV BOLUS
INTRAVENOUS | Status: DC | PRN
  Administered 2020-11-29: 100 mg via INTRAVENOUS

## 2020-11-29 MED ORDER — 0.9 % SODIUM CHLORIDE (POUR BTL) OPTIME
TOPICAL | Status: DC | PRN
  Administered 2020-11-29: 1000 mL

## 2020-11-29 MED ORDER — AMLODIPINE BESYLATE 5 MG PO TABS
5.0000 mg | ORAL_TABLET | Freq: Every day | ORAL | Status: DC
  Administered 2020-11-30 – 2020-12-03 (×4): 5 mg via ORAL
  Filled 2020-11-29 (×4): qty 1

## 2020-11-29 MED ORDER — METOCLOPRAMIDE HCL 5 MG/ML IJ SOLN: 5.0000 mg | Freq: Three times a day (TID) | INTRAMUSCULAR | Status: DC | PRN

## 2020-11-29 MED ORDER — MENTHOL 3 MG MT LOZG
1.0000 | LOZENGE | OROMUCOSAL | Status: DC | PRN
  Filled 2020-11-29 (×2): qty 9

## 2020-11-29 MED ORDER — ADULT MULTIVITAMIN W/MINERALS CH
1.0000 | ORAL_TABLET | Freq: Every day | ORAL | Status: DC
  Administered 2020-11-30 – 2020-12-03 (×4): 1 via ORAL
  Filled 2020-11-29 (×4): qty 1

## 2020-11-29 MED ORDER — MORPHINE SULFATE (PF) 4 MG/ML IV SOLN
4.0000 mg | Freq: Once | INTRAVENOUS | Status: AC
  Administered 2020-11-29: 4 mg via INTRAVENOUS
  Filled 2020-11-29: qty 1

## 2020-11-29 MED ORDER — FENTANYL CITRATE (PF) 100 MCG/2ML IJ SOLN
INTRAMUSCULAR | Status: DC | PRN
  Administered 2020-11-29: 50 ug via INTRAVENOUS
  Administered 2020-11-29: 25 ug via INTRAVENOUS
  Administered 2020-11-29: 50 ug via INTRAVENOUS

## 2020-11-29 MED ORDER — ACETAMINOPHEN 10 MG/ML IV SOLN
INTRAVENOUS | Status: AC
  Filled 2020-11-29: qty 100

## 2020-11-29 MED ORDER — BISACODYL 10 MG RE SUPP
10.0000 mg | Freq: Every day | RECTAL | Status: DC | PRN
  Administered 2020-12-03: 10 mg via RECTAL
  Filled 2020-11-29: qty 1

## 2020-11-29 MED ORDER — NEOMYCIN-POLYMYXIN B GU 40-200000 IR SOLN
Status: DC | PRN
  Administered 2020-11-29: 4 mL

## 2020-11-29 MED ORDER — TRANEXAMIC ACID-NACL 1000-0.7 MG/100ML-% IV SOLN
1000.0000 mg | INTRAVENOUS | Status: AC
  Administered 2020-11-29: 1000 mg via INTRAVENOUS

## 2020-11-29 MED ORDER — GLYCOPYRROLATE 0.2 MG/ML IJ SOLN
INTRAMUSCULAR | Status: AC
  Filled 2020-11-29: qty 1

## 2020-11-29 MED ORDER — SENNOSIDES-DOCUSATE SODIUM 8.6-50 MG PO TABS
1.0000 | ORAL_TABLET | Freq: Every evening | ORAL | Status: DC | PRN
  Administered 2020-12-03: 1 via ORAL
  Filled 2020-11-29: qty 1

## 2020-11-29 MED ORDER — TRANEXAMIC ACID-NACL 1000-0.7 MG/100ML-% IV SOLN: 1000.0000 mg | Freq: Once | INTRAVENOUS | Status: AC

## 2020-11-29 MED ORDER — METHOCARBAMOL 500 MG PO TABS
500.0000 mg | ORAL_TABLET | Freq: Four times a day (QID) | ORAL | Status: DC | PRN
  Administered 2020-11-29 – 2020-12-02 (×2): 500 mg via ORAL
  Filled 2020-11-29 (×3): qty 1

## 2020-11-29 MED ORDER — METHOCARBAMOL 1000 MG/10ML IJ SOLN
500.0000 mg | Freq: Four times a day (QID) | INTRAVENOUS | Status: DC | PRN
  Filled 2020-11-29: qty 5

## 2020-11-29 MED ORDER — EPHEDRINE SULFATE 50 MG/ML IJ SOLN
INTRAMUSCULAR | Status: DC | PRN
  Administered 2020-11-29: 5 mg via INTRAVENOUS
  Administered 2020-11-29: 10 mg via INTRAVENOUS

## 2020-11-29 MED ORDER — DOCUSATE SODIUM 100 MG PO CAPS
100.0000 mg | ORAL_CAPSULE | Freq: Two times a day (BID) | ORAL | Status: DC
  Administered 2020-11-29 – 2020-12-03 (×8): 100 mg via ORAL
  Filled 2020-11-29 (×8): qty 1

## 2020-11-29 MED ORDER — SENNOSIDES-DOCUSATE SODIUM 8.6-50 MG PO TABS
1.0000 | ORAL_TABLET | Freq: Every evening | ORAL | Status: DC | PRN
Start: 2020-11-29 — End: 2020-11-29

## 2020-11-29 SURGICAL SUPPLY — 69 items
BLADE SAW SGTL 13X75X1.27 (BLADE) ×2 IMPLANT
BLADE SURG SZ10 CARB STEEL (BLADE) ×2 IMPLANT
BNDG COHESIVE 4X5 TAN STRL (GAUZE/BANDAGES/DRESSINGS) ×1 IMPLANT
CHLORAPREP W/TINT 26 (MISCELLANEOUS) ×2 IMPLANT
COVER BACK TABLE REUSABLE LG (DRAPES) ×2 IMPLANT
COVER MAYO STAND STRL (DRAPES) ×2 IMPLANT
DERMABOND ADVANCED (GAUZE/BANDAGES/DRESSINGS)
DERMABOND ADVANCED .7 DNX12 (GAUZE/BANDAGES/DRESSINGS) ×1 IMPLANT
DRAPE 3/4 80X56 (DRAPES) ×5 IMPLANT
DRAPE INCISE IOBAN 66X60 STRL (DRAPES) ×2 IMPLANT
DRAPE ORTHO SPLIT 77X108 STRL (DRAPES) ×1
DRAPE SURG 17X11 SM STRL (DRAPES) ×2 IMPLANT
DRAPE SURG ORHT 6 SPLT 77X108 (DRAPES) ×1 IMPLANT
DRAPE U-SHAPE 47X51 STRL (DRAPES) ×1 IMPLANT
DRSG MEPILEX SACRM 8.7X9.8 (GAUZE/BANDAGES/DRESSINGS) ×1 IMPLANT
DRSG OPSITE POSTOP 4X10 (GAUZE/BANDAGES/DRESSINGS) ×1 IMPLANT
DRSG OPSITE POSTOP 4X8 (GAUZE/BANDAGES/DRESSINGS) ×2 IMPLANT
ELECT BLADE 6.5 EXT (BLADE) ×2 IMPLANT
ELECT CAUTERY BLADE 6.4 (BLADE) ×2 IMPLANT
ELECT REM PT RETURN 9FT ADLT (ELECTROSURGICAL) ×2
ELECTRODE REM PT RTRN 9FT ADLT (ELECTROSURGICAL) ×1 IMPLANT
GAUZE SPONGE 4X4 12PLY STRL (GAUZE/BANDAGES/DRESSINGS) ×1 IMPLANT
GAUZE XEROFORM 1X8 LF (GAUZE/BANDAGES/DRESSINGS) ×2 IMPLANT
GLOVE SRG 8 PF TXTR STRL LF DI (GLOVE) ×2 IMPLANT
GLOVE SURG ORTHO LTX SZ8 (GLOVE) ×4 IMPLANT
GLOVE SURG UNDER POLY LF SZ8 (GLOVE) ×2
GOWN STRL REUS W/ TWL LRG LVL3 (GOWN DISPOSABLE) ×1 IMPLANT
GOWN STRL REUS W/ TWL XL LVL3 (GOWN DISPOSABLE) ×1 IMPLANT
GOWN STRL REUS W/TWL LRG LVL3 (GOWN DISPOSABLE) ×1
GOWN STRL REUS W/TWL XL LVL3 (GOWN DISPOSABLE) ×1
HEAD MODULAR ENDO (Orthopedic Implant) ×1 IMPLANT
HEAD UNPLR 54XMDLR STRL HIP (Orthopedic Implant) IMPLANT
HEMOVAC 400ML (MISCELLANEOUS)
HOOD PEEL AWAY FLYTE STAYCOOL (MISCELLANEOUS) ×3 IMPLANT
IV NS IRRIG 3000ML ARTHROMATIC (IV SOLUTION) ×2 IMPLANT
KIT DRAIN HEMOVAC JP 7FR 400ML (MISCELLANEOUS) IMPLANT
KIT TURNOVER KIT A (KITS) ×2 IMPLANT
MANIFOLD NEPTUNE II (INSTRUMENTS) ×4 IMPLANT
NDL MAYO 6 CRC TAPER PT (NEEDLE) IMPLANT
NDL SAFETY ECLIPSE 18X1.5 (NEEDLE) ×1 IMPLANT
NEEDLE HYPO 18GX1.5 SHARP (NEEDLE) ×1
NEEDLE HYPO 22GX1.5 SAFETY (NEEDLE) ×2 IMPLANT
NEEDLE MAYO 6 CRC TAPER PT (NEEDLE) ×2 IMPLANT
NS IRRIG 1000ML POUR BTL (IV SOLUTION) ×2 IMPLANT
PACK HIP PROSTHESIS (MISCELLANEOUS) ×2 IMPLANT
PENCIL SMOKE EVACUATOR (MISCELLANEOUS) ×2 IMPLANT
PILLOW ABDUCTION FOAM SM (MISCELLANEOUS) ×2 IMPLANT
PULSAVAC PLUS IRRIG FAN TIP (DISPOSABLE) ×2
RETRIEVER SUT HEWSON (MISCELLANEOUS) IMPLANT
SLEEVE UNITRAX V40 (Orthopedic Implant) ×1 IMPLANT
SLEEVE UNITRAX V40 +12 (Orthopedic Implant) IMPLANT
SPONGE T-LAP 18X18 ~~LOC~~+RFID (SPONGE) ×7 IMPLANT
STAPLER SKIN PROX 35W (STAPLE) ×2 IMPLANT
STEM HIP ANGLE 40X123-127 SZ10 (Stem) ×1 IMPLANT
SUT ETHIBOND #5 BRAIDED 30INL (SUTURE) ×2 IMPLANT
SUT MNCRL 4-0 (SUTURE) ×1
SUT MNCRL 4-0 27 PS-2 XMFL (SUTURE) ×1
SUT MNCRL 4-0 27XMFL (SUTURE) ×1
SUT VIC AB 0 CT1 36 (SUTURE) ×4 IMPLANT
SUT VIC AB 2-0 CT2 27 (SUTURE) ×4 IMPLANT
SUTURE MNCRL 4-0 27XMF (SUTURE) ×1 IMPLANT
SYR 20ML LL LF (SYRINGE) ×2 IMPLANT
SYR 50ML LL SCALE MARK (SYRINGE) ×2 IMPLANT
TAPE MICROFOAM 4IN (TAPE) ×1 IMPLANT
TAPE TRANSPORE STRL 2 31045 (GAUZE/BANDAGES/DRESSINGS) ×2 IMPLANT
TIP BRUSH PULSAVAC PLUS 24.33 (MISCELLANEOUS) ×2 IMPLANT
TIP FAN IRRIG PULSAVAC PLUS (DISPOSABLE) ×1 IMPLANT
TUBE KAMVAC SUCTION (TUBING) ×2 IMPLANT
TUBE SUCT KAM VAC (TUBING) ×2 IMPLANT

## 2020-11-29 NOTE — H&P (Signed)
H&P reviewed. No significant changes noted.  

## 2020-11-29 NOTE — ED Provider Notes (Signed)
[facility] Emergency Department Provider Note  ____________________________________________   Event Date/Time   First MD Initiated Contact with Patient 11/29/20 [phone]     (approximate)  I have reviewed the triage vital signs and the nursing notes.   HISTORY  Chief Complaint Fall    HPI Jacob Moses is a 87 y.o. male with history of hypertension here with fall.  The patient states that he was bending forward earlier to pick up something on the ground at home, where the glasses balance and fell forward.  He fell onto his right hip.  He reports immediate onset of aching, throbbing, severe pain.  He has since had pain with any kind of movement or motion.  The pain is 8 out of 10, aching, worse with movement.  It is improved when still.  Denies any associated distal numbness or weakness.  Denies any nausea or vomiting.  No other complaints.  No recent medication changes.  He does not believe he has had, but is 100% sure.    Past Medical History:  Diagnosis Date   H/O vertigo    History of stomach ulcers    HOH (hard of hearing)    Hypertension    Insomnia    Wears dentures     Patient Active Problem List   Diagnosis Date Noted   Closed right hip fracture ([place]) 11/29/2020   Fall at home, initial encounter 11/29/2020   Bradycardia 11/29/2020   HTN (hypertension) 11/29/2020   GERD (gastroesophageal reflux disease) 11/29/2020   Anxiety 11/29/2020   CKD (chronic kidney disease), stage IIIb 11/29/2020   Iron deficiency anemia 11/29/2020   Protein-calorie malnutrition, severe 02/24/2019   SDH (subdural hematoma) ([place]) 02/22/2019    Past Surgical History:  Procedure Laterality Date   APPENDECTOMY  1999   Dr.Byrnett   BELPHAROPTOSIS REPAIR  2010   CATARACT EXTRACTION W/PHACO Left 09/13/2019   Procedure: CATARACT EXTRACTION PHACO AND INTRAOCULAR LENS PLACEMENT (IOC) LEFT  5.06  00:48.8;  Surgeon: Birder Robson, MD;  Location: [place];   Service: Ophthalmology;  Laterality: Left;   CATARACT EXTRACTION W/PHACO Right 10/04/2019   Procedure: CATARACT EXTRACTION PHACO AND INTRAOCULAR LENS PLACEMENT ([place]) RIGHT;  Surgeon: Birder Robson, MD;  Location: [place];  Service: Ophthalmology;  Laterality: Right;  4.64 0:43.9   ECTROPION REPAIR Bilateral 04/24/2015   Procedure: REPAIR OF ECTROPION TARSAL WEDGE LEFT EYE REPAIR EXTENSIVE BILATERAL;  Surgeon: Karle Starch, MD;  Location: [place];  Service: Ophthalmology;  Laterality: Bilateral;   HERNIA REPAIR  1999   Dr.Byrnett   LACRIMAL DUCT EXPLORATION Bilateral 04/24/2015   Procedure: LACRIMAL DUCT EXPLORATION PUNCTOPLASTY;  Surgeon: Karle Starch, MD;  Location: [place];  Service: Ophthalmology;  Laterality: Bilateral;   SEPTOPLASTY     STOMACH SURGERY  1966   ulcer    Prior to Admission medications   Medication Sig Start Date End Date Taking? Authorizing Provider  ALPRAZolam (XANAX XR) 0.5 MG 24 hr tablet Take 0.5 mg by mouth daily.   Yes [provider]  amLODipine (NORVASC) 5 MG tablet Take 1 tablet (5 mg total) by mouth daily. 02/25/19  Yes Demetrios Loll, MD  Multiple Vitamins-Minerals (MULTIVITAMIN WITH MINERALS) tablet Take 1 tablet by mouth daily.   Yes [provider]  traZODone (DESYREL) 50 MG tablet Take 0.5 tablets (25 mg total) by mouth at bedtime as needed for sleep. 02/25/19  Yes Demetrios Loll, MD    Allergies Patient has no known allergies.  Family History  Problem Relation Age of Onset   Cancer Mother    Diabetes Father     Social History Social History   Tobacco Use   Smoking status: Former    Years: 30.00    Types: Cigarettes    Quit date: 05/05/1978    Years since quitting: 42.6   Smokeless tobacco: Never  Vaping Use   Vaping Use: Never used  Substance Use Topics   Alcohol use: No   Drug use: No    Review of Systems  Review of Systems  Constitutional:  Positive for fatigue. Negative for chills  and fever.  HENT:  Negative for sore throat.   Respiratory:  Negative for shortness of breath.   Cardiovascular:  Negative for chest pain.  Gastrointestinal:  Negative for abdominal pain.  Genitourinary:  Negative for flank pain.  Musculoskeletal:  Positive for arthralgias and gait problem. Negative for neck pain.  Skin:  Negative for rash and wound.  Allergic/Immunologic: Negative for immunocompromised state.  Neurological:  Negative for weakness and numbness.  Hematological:  Does not bruise/bleed easily.  All other systems reviewed and are negative.   ____________________________________________  PHYSICAL EXAM:      VITAL SIGNS: ED Triage Vitals  Enc Vitals Group     BP 11/29/20 0836 (!) 187/85     Pulse Rate 11/29/20 0836 (!) 53     Resp 11/29/20 0836 16     Temp 11/29/20 0836 98 F (36.7 C)     Temp Source 11/29/20 0836 Oral     SpO2 11/29/20 0833 100 %     Weight 11/29/20 0834 137 lb (62.1 kg)     Height 11/29/20 0834 '5\' 11"'$  (1.803 m)     Head Circumference --      Peak Flow --      Pain Score 11/29/20 0834 8     Pain Loc --      Pain Edu? --      Excl. in [place]? --      Physical Exam Vitals and nursing note reviewed.  Constitutional:      General: He is not in acute distress.    Appearance: He is well-developed.  HENT:     Head: Normocephalic and atraumatic.  Eyes:     Conjunctiva/sclera: Conjunctivae normal.  Cardiovascular:     Rate and Rhythm: Normal rate and regular rhythm.     Heart sounds: Normal heart sounds. No murmur heard.   No friction rub.  Pulmonary:     Effort: Pulmonary effort is normal. No respiratory distress.     Breath sounds: Normal breath sounds. No wheezing or rales.  Abdominal:     General: There is no distension.     Palpations: Abdomen is soft.     Tenderness: There is no abdominal tenderness.  Musculoskeletal:     Cervical back: Neck supple.     Comments: Shortening and external rotation of the right hip with marked tenderness  to any kind of movement or palpation.  No alleviating factors.  Skin:    General: Skin is warm.     Capillary Refill: Capillary refill takes less than 2 seconds.  Neurological:     Mental Status: He is alert and oriented to person, place, and time.     Motor: No abnormal muscle tone.      ____________________________________________   LABS (all labs ordered are listed, but only abnormal results are displayed)  Labs Reviewed  CBC WITH DIFFERENTIAL/PLATELET - Abnormal; Notable for the  following components:      Result Value   RBC 3.59 (*)    Hemoglobin 11.7 (*)    HCT 35.9 (*)    All other components within normal limits  BASIC METABOLIC PANEL - Abnormal; Notable for the following components:   Glucose, Bld 115 (*)    BUN 31 (*)    Creatinine, Ser 1.70 (*)    GFR, Estimated 38 (*)    All other components within normal limits  RESP PANEL BY RT-PCR (FLU A&B, COVID) ARPGX2  PROTIME-INR  APTT  CBC  BASIC METABOLIC PANEL  TYPE AND SCREEN  SURGICAL PATHOLOGY  TROPONIN I (HIGH SENSITIVITY)    ____________________________________________  EKG: Normal sinus rhythm, VR 53. PR 206,QRS 104, QTc 441. No acute St elevations or depressions. No ischemia or infarct. ________________________________________  RADIOLOGY All imaging, including plain films, CT scans, and ultrasounds, independently reviewed by me, and interpretations confirmed via formal radiology reads.  ED MD interpretation:   CXR: Clear XR Hip R: Comminuted R femoral neck fx  Official radiology report(s): CT Head Wo Contrast  Result Date: 11/29/2020 CLINICAL DATA:  Neck trauma (Age >= 65y); Head trauma, minor (Age >= 65y) EXAM: CT HEAD WITHOUT CONTRAST CT CERVICAL SPINE WITHOUT CONTRAST TECHNIQUE: Multidetector CT imaging of the head and cervical spine was performed following the standard protocol without intravenous contrast. Multiplanar CT image reconstructions of the cervical spine were also generated. COMPARISON:   12/30/2019. FINDINGS: CT HEAD FINDINGS Brain: In comparison to 12/30/2019, no substantial change in the size of a 12 mm mixed density left subdural collection. There is a small amount of hyperdensity within the collection which could represent recent hemorrhage. No significant mass effect or midline shift. No clearly new hemorrhage. No evidence of acute large vascular territory infarct. Similar patchy white matter hypoattenuation, most likely related to chronic microvascular ischemic disease. No hydrocephalus. No mass lesion. Vascular: No hyperdense vessel identified. Calcific intracranial atherosclerosis. Skull: No acute fracture. Sinuses/Orbits: Polyp or retention cyst in the inferior left maxillary sinus. Mild ethmoid air cell mucosal thickening. Otherwise, sinuses are clear. Unremarkable orbits. Other: No mastoid effusions. CT CERVICAL SPINE FINDINGS Alignment: Similar alignment. Similar mild reversal the cervical lordosis. Similar slight widening of the C2-C3 disc space anteriorly. No substantial sagittal subluxation. Mild broad dextrocurvature. Skull base and vertebrae: Similar appearance of extensive ankylosis throughout the cervical spine from C2 inferiorly. No evidence of acute fracture. Vertebral body heights are similar to prior. Diffuse osteopenia. Soft tissues and spinal canal: No prevertebral fluid or swelling. No visible canal hematoma. Disc levels: Similar partially calcified central disc protrusion at C2-C3. Multilevel facet and uncovertebral hypertrophy. Multi level posterior osteophytic spurring which narrows the canal without evidence of high-grade bony canal stenosis. Upper chest: Biapical pleuroparenchymal scarring. Otherwise, visualized lung apices are clear. IMPRESSION: CT Head: 1. In comparison to 12/30/2019, no substantial change in size of a 12 mm mixed density left subdural collection. There is a small amount of hyperdensity within the collection, which may represent recent hemorrhage. No  significant mass effect or midline shift. 2. Otherwise, no new acute abnormality. CT Cervical Spine: 1. No evidence of acute fracture or traumatic malalignment. 2. Similar appearance of extensive ankylosis throughout the cervical spine from C2 inferiorly. 3. Diffuse osteopenia. Electronically Signed   By: Margaretha Sheffield MD   On: 11/29/2020 11:40   CT Cervical Spine Wo Contrast  Result Date: 11/29/2020 CLINICAL DATA:  Neck trauma (Age >= 65y); Head trauma, minor (Age >= 65y) EXAM: CT HEAD WITHOUT CONTRAST  CT CERVICAL SPINE WITHOUT CONTRAST TECHNIQUE: Multidetector CT imaging of the head and cervical spine was performed following the standard protocol without intravenous contrast. Multiplanar CT image reconstructions of the cervical spine were also generated. COMPARISON:  12/30/2019. FINDINGS: CT HEAD FINDINGS Brain: In comparison to 12/30/2019, no substantial change in the size of a 12 mm mixed density left subdural collection. There is a small amount of hyperdensity within the collection which could represent recent hemorrhage. No significant mass effect or midline shift. No clearly new hemorrhage. No evidence of acute large vascular territory infarct. Similar patchy white matter hypoattenuation, most likely related to chronic microvascular ischemic disease. No hydrocephalus. No mass lesion. Vascular: No hyperdense vessel identified. Calcific intracranial atherosclerosis. Skull: No acute fracture. Sinuses/Orbits: Polyp or retention cyst in the inferior left maxillary sinus. Mild ethmoid air cell mucosal thickening. Otherwise, sinuses are clear. Unremarkable orbits. Other: No mastoid effusions. CT CERVICAL SPINE FINDINGS Alignment: Similar alignment. Similar mild reversal the cervical lordosis. Similar slight widening of the C2-C3 disc space anteriorly. No substantial sagittal subluxation. Mild broad dextrocurvature. Skull base and vertebrae: Similar appearance of extensive ankylosis throughout the cervical  spine from C2 inferiorly. No evidence of acute fracture. Vertebral body heights are similar to prior. Diffuse osteopenia. Soft tissues and spinal canal: No prevertebral fluid or swelling. No visible canal hematoma. Disc levels: Similar partially calcified central disc protrusion at C2-C3. Multilevel facet and uncovertebral hypertrophy. Multi level posterior osteophytic spurring which narrows the canal without evidence of high-grade bony canal stenosis. Upper chest: Biapical pleuroparenchymal scarring. Otherwise, visualized lung apices are clear. IMPRESSION: CT Head: 1. In comparison to 12/30/2019, no substantial change in size of a 12 mm mixed density left subdural collection. There is a small amount of hyperdensity within the collection, which may represent recent hemorrhage. No significant mass effect or midline shift. 2. Otherwise, no new acute abnormality. CT Cervical Spine: 1. No evidence of acute fracture or traumatic malalignment. 2. Similar appearance of extensive ankylosis throughout the cervical spine from C2 inferiorly. 3. Diffuse osteopenia. Electronically Signed   By: Margaretha Sheffield MD   On: 11/29/2020 11:40   DG Pelvis Portable  Result Date: 11/29/2020 CLINICAL DATA:  Postop right hip hemiarthroplasty. EXAM: PORTABLE PELVIS 1-2 VIEWS COMPARISON:  Preoperative radiograph earlier today. FINDINGS: Right hip hemiarthroplasty in expected alignment. No periprosthetic lucency. Recent postsurgical change includes air and edema in the soft tissues. Lateral skin staples in place. IMPRESSION: Right hip hemiarthroplasty without immediate postoperative complication. Electronically Signed   By: Keith Rake M.D.   On: 11/29/2020 17:09   DG Pelvis Portable  Result Date: 11/29/2020 CLINICAL DATA:  Right hip replacement. EXAM: PORTABLE PELVIS 1-2 VIEWS COMPARISON:  Same day. FINDINGS: Two intraoperative images were obtained of the right hip. The femoral prosthesis is noted. Expected postoperative changes  are seen in the surrounding soft tissues. IMPRESSION: Femoral prosthesis is noted. Electronically Signed   By: Marijo Conception M.D.   On: 11/29/2020 15:47   DG Pelvis Portable  Result Date: 11/29/2020 CLINICAL DATA:  Closed right hip fracture EXAM: PORTABLE PELVIS 1-2 VIEWS COMPARISON:  None. FINDINGS: There is a right femoral neck fracture with varus angulation. No subluxation or dislocation. Hip joints are symmetric. IMPRESSION: Right femoral neck fracture with varus angulation. Electronically Signed   By: Rolm Baptise M.D.   On: 11/29/2020 10:17   DG Chest Port 1 View  Result Date: 11/29/2020 CLINICAL DATA:  87 year old male status post fall at home. Right femoral neck fracture. EXAM: PORTABLE CHEST 1  VIEW COMPARISON:  None. FINDINGS: Portable AP view at 0845 hours. Low lung volumes. Mild cardiomegaly. Other mediastinal contours are within normal limits. Visualized tracheal air column is within normal limits. Allowing for portable technique the lungs are clear. No pneumothorax or pleural effusion is evident. No acute osseous abnormality identified. IMPRESSION: Low lung volumes.  No acute cardiopulmonary abnormality. Electronically Signed   By: Genevie Ann M.D.   On: 11/29/2020 09:15   DG Hip Unilat  With Pelvis 2-3 Views Right  Result Date: 11/29/2020 CLINICAL DATA:  Fall. EXAM: DG HIP (WITH OR WITHOUT PELVIS) 2-3V RIGHT COMPARISON:  No prior. FINDINGS: Diffuse severe osteopenia and degenerative change. Comminuted right femoral neck fracture with angulation deformity noted. No evidence of dislocation. Pelvic calcifications consistent phleboliths. Dystrophic calcifications noted over the pelvis. IMPRESSION: 1. Comminuted right femoral neck fracture with angulation deformity. 2.  Severe diffuse osteopenia.  Diffuse degenerative change. Electronically Signed   By: Marcello Moores  Register   On: 11/29/2020 09:02    ____________________________________________  PROCEDURES   Procedure(s) performed (including  Critical Care):  Procedures  ____________________________________________  INITIAL IMPRESSION / MDM / Echo / ED COURSE  As part of my medical decision making, I reviewed the following data within the [place] notes reviewed and incorporated, Old chart reviewed, Notes from prior ED visits, and  Controlled Substance Database       *ROEL SIVERS was evaluated in Emergency Department on 11/29/2020 for the symptoms described in the history of present illness. He was evaluated in the context of the global COVID-19 pandemic, which necessitated consideration that the patient might be at risk for infection with the SARS-CoV-2 virus that causes COVID-19. Institutional protocols and algorithms that pertain to the evaluation of patients at risk for COVID-19 are in a state of rapid change based on information released by regulatory bodies including the CDC and federal and state organizations. These policies and algorithms were followed during the patient's care in the ED.  Some ED evaluations and interventions may be delayed as a result of limited staffing during the pandemic.*     Medical Decision Making:  87 yo M here with R hip pain after fall. Fall seems mechanical in nature. No apparent head trauma. Imaging shows R femoral neck fx. Pt not on anticoagulation and is otherwise well appearing, nontoxic. Discussed with Dr. Posey Pronto. Pt will likely go to OR this afternoon. NPO. mIVF running, will admit to medicine.  ____________________________________________  FINAL CLINICAL IMPRESSION(S) / ED DIAGNOSES  Final diagnoses:  Closed fracture of neck of right femur, initial encounter ([place])     MEDICATIONS GIVEN DURING THIS VISIT:  Medications  amLODipine (NORVASC) tablet 5 mg ( Oral MAR [place] 11/29/20 1657)  ALPRAZolam (XANAX XR) 24 hr tablet 0.5 mg ( Oral MAR [place] 11/29/20 1657)  traZODone (DESYREL) tablet 25 mg ( Oral MAR [place] 11/29/20 1657)   multivitamin with minerals tablet 1 tablet ( Oral MAR [place] 11/29/20 1657)  enoxaparin (LOVENOX) injection 40 mg (has no administration in time range)  0.9 %  sodium chloride infusion ( Intravenous New Bag/Given 11/29/20 1701)  docusate sodium (COLACE) capsule 100 mg (has no administration in time range)  senna-docusate (Senokot-S) tablet 1 tablet (has no administration in time range)  bisacodyl (DULCOLAX) suppository 10 mg (has no administration in time range)  sodium phosphate (FLEET) 7-19 GM/118ML enema 1 enema (has no administration in time range)  ondansetron (ZOFRAN) tablet 4 mg (has no administration in time range)  Or  ondansetron (ZOFRAN) injection 4 mg (has no administration in time range)  metoCLOPramide (REGLAN) tablet 5-10 mg (has no administration in time range)    Or  metoCLOPramide (REGLAN) injection 5-10 mg (has no administration in time range)  menthol-cetylpyridinium (CEPACOL) lozenge 3 mg (has no administration in time range)    Or  phenol (CHLORASEPTIC) mouth spray 1 spray (has no administration in time range)  ceFAZolin (ANCEF) IVPB 1 g/50 mL premix (has no administration in time range)  oxyCODONE (Oxy IR/ROXICODONE) immediate release tablet 2.5-5 mg (has no administration in time range)  acetaminophen (TYLENOL) tablet 1,000 mg (has no administration in time range)  traMADol (ULTRAM) tablet 50 mg (has no administration in time range)  oxyCODONE (Oxy IR/ROXICODONE) immediate release tablet 5-10 mg (has no administration in time range)  HYDROmorphone (DILAUDID) injection 0.2-0.4 mg (has no administration in time range)  methocarbamol (ROBAXIN) tablet 500 mg (has no administration in time range)    Or  methocarbamol (ROBAXIN) 500 mg in dextrose 5 % 50 mL IVPB (has no administration in time range)  fentaNYL (SUBLIMAZE) injection 25 mcg (25 mcg Intravenous Given 11/29/20 0916)  ondansetron (ZOFRAN) injection 4 mg (4 mg Intravenous Given 11/29/20 0915)  0.9 %  sodium  chloride infusion ( Intravenous New Bag/Given 11/29/20 1006)  morphine 4 MG/ML injection 4 mg (4 mg Intravenous Given 11/29/20 1008)  ceFAZolin (ANCEF) IVPB 1 g/50 mL premix ( Intravenous MAR Unhold 11/29/20 1657)  tranexamic acid (CYKLOKAPRON) '1000MG'$ /159m IVPB (  Override pull for Anesthesia 11/29/20 1409)  tranexamic acid (CYKLOKAPRON) IVPB 1,000 mg (1,000 mg Intravenous Given 11/29/20 1348)  tranexamic acid (CYKLOKAPRON) IVPB 1,000 mg (1,000 mg Intravenous New Bag/Given 11/29/20 1549)     ED Discharge Orders     None        Note:  This document was prepared using Dragon voice recognition software and may include unintentional dictation errors.   IDuffy Bruce MD 11/29/20 2005

## 2020-11-29 NOTE — Plan of Care (Signed)

## 2020-11-29 NOTE — Anesthesia Postprocedure Evaluation (Signed)
Anesthesia Post Note  Patient: Jacob Moses  Procedure(s) Performed: ARTHROPLASTY BIPOLAR HIP (HEMIARTHROPLASTY)-Extra Scrub (Right: Hip)  Patient location during evaluation: PACU Anesthesia Type: Spinal Level of consciousness: awake and alert Pain management: pain level controlled Vital Signs Assessment: post-procedure vital signs reviewed and stable Respiratory status: spontaneous breathing, nonlabored ventilation, respiratory function stable and patient connected to nasal cannula oxygen Cardiovascular status: blood pressure returned to baseline and stable Postop Assessment: no apparent nausea or vomiting Anesthetic complications: no   No notable events documented.   Last Vitals:  Vitals:   11/29/20 1600 11/29/20 1619  BP: (!) 176/89 (!) 156/66  Pulse: (!) 54 60  Resp: 12 20  Temp:    SpO2: 95% 97%    Last Pain:  Vitals:   11/29/20 1619  TempSrc:   PainSc: 0-No pain                 Precious Haws Arletta Lumadue

## 2020-11-29 NOTE — Consult Note (Signed)
Cardiology Consultation Note    Patient ID: Jacob Moses, MRN: [id number], DOB/AGE: 1928-04-13 87 y.o. Admit date: 11/29/2020   Date of Consult: 11/29/2020 Primary Physician: Albina Billet, MD Primary Cardiologist: Dr. Saralyn Pilar  Chief Complaint: hip pain Reason for Consultation: preop Requesting MD: Dr. Blaine Hamper  HPI: Jacob Moses is a 87 y.o. male with history of hypertension, moderate AI, moderate MR who presented to emergency room after a fall with hip pain noted to have a fracture.  Preoperative risk assessment was requested.  Patient has a history of preserved LV function with an echocardiogram in 2021 showing an EF of 55% with moderate AI with mild to moderate MR.  Is treated with amlodipine as an outpatient.  EKG in the emergency room showed sinus bradycardia at a rate of 58 with left anterior fascicular block and right bundle branch block.  No ischemia.  Chest x-ray showed no acute cardiopulmonary disease.  Patient had no chest pain.  Empiric high-sensitivity troponin was 9.  Patient has acute on chronic renal insufficiency with creatinine 1.7.  This was however improved from 11 months ago when it was 1.9.  Hemoglobin 11.7.  Etiology of the fall was mechanical as he lost his balance when bending over to pick up something on the floor following his right hip.  No syncope.  Patient is fairly active.  Walked 3 miles in the mall yesterday.  Past Medical History:  Diagnosis Date   H/O vertigo    History of stomach ulcers    HOH (hard of hearing)    Hypertension    Insomnia    Wears dentures       Surgical History:  Past Surgical History:  Procedure Laterality Date   APPENDECTOMY  1999   [place]  2010   CATARACT EXTRACTION W/PHACO Left 09/13/2019   Procedure: CATARACT EXTRACTION PHACO AND INTRAOCULAR LENS PLACEMENT (IOC) LEFT  5.06  00:48.8;  Surgeon: Birder Robson, MD;  Location: [place];  Service: Ophthalmology;  Laterality: Left;    CATARACT EXTRACTION W/PHACO Right 10/04/2019   Procedure: CATARACT EXTRACTION PHACO AND INTRAOCULAR LENS PLACEMENT ([place]) RIGHT;  Surgeon: Birder Robson, MD;  Location: [place];  Service: Ophthalmology;  Laterality: Right;  4.64 0:43.9   ECTROPION REPAIR Bilateral 04/24/2015   Procedure: REPAIR OF ECTROPION TARSAL WEDGE LEFT EYE REPAIR EXTENSIVE BILATERAL;  Surgeon: Karle Starch, MD;  Location: [place];  Service: Ophthalmology;  Laterality: Bilateral;   HERNIA REPAIR  1999   Dr.Byrnett   LACRIMAL DUCT EXPLORATION Bilateral 04/24/2015   Procedure: LACRIMAL DUCT EXPLORATION PUNCTOPLASTY;  Surgeon: Karle Starch, MD;  Location: [place];  Service: Ophthalmology;  Laterality: Bilateral;   SEPTOPLASTY     STOMACH SURGERY  1966   ulcer     Home Meds: Prior to Admission medications   Medication Sig Start Date End Date Taking? Authorizing Provider  ALPRAZolam (XANAX XR) 0.5 MG 24 hr tablet Take 0.5 mg by mouth daily.   Yes [provider]  amLODipine (NORVASC) 5 MG tablet Take 1 tablet (5 mg total) by mouth daily. 02/25/19  Yes Demetrios Loll, MD  Multiple Vitamins-Minerals (MULTIVITAMIN WITH MINERALS) tablet Take 1 tablet by mouth daily.   Yes [provider]  traZODone (DESYREL) 50 MG tablet Take 0.5 tablets (25 mg total) by mouth at bedtime as needed for sleep. 02/25/19  Yes Demetrios Loll, MD    Inpatient Medications:   acetaminophen  1,000 mg Oral Once  ALPRAZolam  0.5 mg Oral Daily   amLODipine  5 mg Oral Daily   multivitamin with minerals  1 tablet Oral Daily    sodium chloride      ceFAZolin (ANCEF) IV Stopped (11/29/20 1038)    Allergies: No Known Allergies  Social History   Socioeconomic History   Marital status: Widowed    Spouse name: Not on file   Number of children: Not on file   Years of education: Not on file   Highest education level: Not on file  Occupational History   Not on file  Tobacco Use   Smoking status:  Former    Years: 30.00    Types: Cigarettes    Quit date: 05/05/1978    Years since quitting: 42.6   Smokeless tobacco: Never  Vaping Use   Vaping Use: Never used  Substance and Sexual Activity   Alcohol use: No   Drug use: No   Sexual activity: Not on file  Other Topics Concern   Not on file  Social History Narrative   Not on file   Social Determinants of Health   Financial Resource Strain: Not on file  Food Insecurity: Not on file  Transportation Needs: Not on file  Physical Activity: Not on file  Stress: Not on file  Social Connections: Not on file  Intimate Partner Violence: Not on file     Family History  Problem Relation Age of Onset   Cancer Mother    Diabetes Father      Review of Systems: A 12-system review of systems was performed and is negative except as noted in the HPI.  Labs: No results for input(s): CKTOTAL, CKMB, TROPONINI in the last 72 hours. Lab Results  Component Value Date   WBC 8.2 11/29/2020   HGB 11.7 (L) 11/29/2020   HCT 35.9 (L) 11/29/2020   MCV 100.0 11/29/2020   PLT 269 11/29/2020    Recent Labs  Lab 11/29/20 0837  NA 136  K 4.7  CL 102  CO2 22  BUN 31*  CREATININE 1.70*  CALCIUM 9.0  GLUCOSE 115*   No results found for: CHOL, HDL, LDLCALC, TRIG No results found for: DDIMER  Radiology/Studies:  DG Pelvis Portable  Result Date: 11/29/2020 CLINICAL DATA:  Closed right hip fracture EXAM: PORTABLE PELVIS 1-2 VIEWS COMPARISON:  None. FINDINGS: There is a right femoral neck fracture with varus angulation. No subluxation or dislocation. Hip joints are symmetric. IMPRESSION: Right femoral neck fracture with varus angulation. Electronically Signed   By: Rolm Baptise M.D.   On: 11/29/2020 10:17   DG Chest Port 1 View  Result Date: 11/29/2020 CLINICAL DATA:  87 year old male status post fall at home. Right femoral neck fracture. EXAM: PORTABLE CHEST 1 VIEW COMPARISON:  None. FINDINGS: Portable AP view at 0845 hours. Low lung  volumes. Mild cardiomegaly. Other mediastinal contours are within normal limits. Visualized tracheal air column is within normal limits. Allowing for portable technique the lungs are clear. No pneumothorax or pleural effusion is evident. No acute osseous abnormality identified. IMPRESSION: Low lung volumes.  No acute cardiopulmonary abnormality. Electronically Signed   By: Genevie Ann M.D.   On: 11/29/2020 09:15   DG Hip Unilat  With Pelvis 2-3 Views Right  Result Date: 11/29/2020 CLINICAL DATA:  Fall. EXAM: DG HIP (WITH OR WITHOUT PELVIS) 2-3V RIGHT COMPARISON:  No prior. FINDINGS: Diffuse severe osteopenia and degenerative change. Comminuted right femoral neck fracture with angulation deformity noted. No evidence of dislocation. Pelvic calcifications consistent  phleboliths. Dystrophic calcifications noted over the pelvis. IMPRESSION: 1. Comminuted right femoral neck fracture with angulation deformity. 2.  Severe diffuse osteopenia.  Diffuse degenerative change. Electronically Signed   By: Marcello Moores  Register   On: 11/29/2020 09:02    Wt Readings from Last 3 Encounters:  11/29/20 62.1 kg  12/30/19 58.1 kg  10/04/19 58.1 kg    EKG: Sinus bradycardia with right bundle branch block.  No ischemia  Physical Exam:  Blood pressure (!) 178/74, pulse (!) 50, temperature 98 F (36.7 C), temperature source Oral, resp. rate 16, height '5\' 11"'$  (1.803 m), weight 62.1 kg, SpO2 100 %. Body mass index is 19.11 kg/m. General: Well developed, well nourished, in no acute distress. Head: Normocephalic, atraumatic, sclera non-icteric, no xanthomas, nares are without discharge.  Neck: Negative for carotid bruits. JVD not elevated. Lungs: Clear bilaterally to auscultation without wheezes, rales, or rhonchi. Breathing is unlabored. Heart: RRR with S1 S2. No murmurs, rubs, or gallops appreciated. Abdomen: Soft, non-tender, non-distended with normoactive bowel sounds. No hepatomegaly. No rebound/guarding. No obvious abdominal  masses. Msk:  Strength and tone appear normal for age. Extremities: No clubbing or cyanosis. No edema.  Distal pedal pulses are 2+ and equal bilaterally. Neuro: Alert and oriented X 3. No facial asymmetry. No focal deficit. Moves all extremities spontaneously. Psych:  Responds to questions appropriately with a normal affect.     Assessment and Plan  87 year old male with history of moderate AI and MR with preserved LV function by echo approximately a year and a half ago who is quite active and in fact walked 3 laps around the mall yesterday.  Had a mechanical fall causing hip fracture.  Patient is at increased risk from an age standpoint for surgery but otherwise is optimized from a cardiac standpoint.  No further cardiac work-up indicated prior to surgery.  Would proceed with routine cardiac monitoring.  Patient is optimized from a cardiac standpoint.  Signed, Teodoro Spray MD 11/29/2020, 11:21 AM Pager: [phone]

## 2020-11-29 NOTE — Transfer of Care (Signed)
Immediate Anesthesia Transfer of Care Note  Patient: Jacob Moses  Procedure(s) Performed: ARTHROPLASTY BIPOLAR HIP (HEMIARTHROPLASTY)-Extra Scrub (Right: Hip)  Patient Location: PACU  Anesthesia Type:General  Level of Consciousness: awake  Airway & Oxygen Therapy: Patient Spontanous Breathing and Patient connected to face mask oxygen  Post-op Assessment: Report given to RN, Post -op Vital signs reviewed and stable and Patient moving all extremities  Post vital signs: Reviewed and stable  Last Vitals:  Vitals Value Taken Time  BP 139/70 11/29/20 1545  Temp    Pulse 39 11/29/20 1546  Resp 18 11/29/20 1547  SpO2 86 % 11/29/20 1546  Vitals shown include unvalidated device data.  Last Pain:  Vitals:   11/29/20 1042  TempSrc:   PainSc: 3          Complications: No notable events documented.

## 2020-11-29 NOTE — Anesthesia Procedure Notes (Addendum)
Procedure Name: Intubation Date/Time: 11/29/2020 1:53 PM Performed by: Loni Dolly, CRNA Pre-anesthesia Checklist: Patient identified, Emergency Drugs available, Suction available and Patient being monitored Patient Re-evaluated:Patient Re-evaluated prior to induction Oxygen Delivery Method: Circle system utilized Preoxygenation: Pre-oxygenation with 100% oxygen Induction Type: IV induction Ventilation: Mask ventilation without difficulty Laryngoscope Size: Mac, McGraph and 3 Grade View: Grade II Tube type: Oral Tube size: 7.0 mm Number of attempts: 1 Airway Equipment and Method: Stylet and Oral airway Placement Confirmation: ETT inserted through vocal cords under direct vision, positive ETCO2 and breath sounds checked- equal and bilateral Secured at: 21 cm Tube secured with: Tape Dental Injury: Teeth and Oropharynx as per pre-operative assessment

## 2020-11-29 NOTE — Op Note (Signed)
DATE OF SURGERY: 11/29/2020  PREOPERATIVE DIAGNOSIS: Right femoral neck fracture  POSTOPERATIVE DIAGNOSIS: Right femoral neck fracture  PROCEDURE: Right hip hemiarthroplasty  SURGEON: Cato Mulligan, MD  ANESTHESIA: Gen  EBL: 100 cc  COMPONENTS:  Stryker - Accolade II Size 10 Stem Stryker - Unitrax 82m head with +12 offset neck   INDICATIONS: Jacob CANCELLIERIis a 87y.o. male who sustained a displaced femoral neck fracture after a fall. Risks and benefits of hip hemiarthroplasty were explained to the patient and/or family. Risks include but are not limited to bleeding, infection, injury to tissues, nerves, vessels, periprosthetic infection, dislocation, limb length discrepancy and risks of anesthesia. The patient and/or family understands these risks, has completed an informed consent and wishes to proceed.   PROCEDURE:  The patient was identified in the preoperative holding area and the operative extremity was marked.  The patient was then transferred to the operating room suite and mobilized from the hospital gurney to the operating room table. Anesthesia was administered without complication. The patient was then transitioned to a lateral position.  All bony prominences were padded per protocol.  An axillary roll was placed.  Careful attention was paid to the contralateral side peroneal nerve, which was free from pressure with use of appropriate padding and blankets. A time-out was performed to confirm the patient's identity and the correct laterality of surgery. The patient was then prepped and draped in the usual sterile fashion. Appropriate pre-operative antibiotics were administered. Tranexamic acid was administered preoperatively.    An incision that centered on the posterior tip of the greater trochanter with a posterior curve was made. Dissection was carried down through the subcutaneous tissue.  Careful attention was made to maintain hemostasis using electrocautery.  Dissection  brought uKoreato the level of the deep fascia where the gluteus maximus muscle and proximal portion of the IT band were identified.  The proximal region of the IT band was incised in linear fashion and this incision was extended proximally in a curvilinear fashion to split the gluteus maximus muscle parallel to its fibers to minimize bleeding.  This was accomplished using a combination of bovie electrocautery as well as blunt dissection.  The trochanteric bursa was then visualized and dissected from anterior to posterior. A blunt homan retractor was placed beneath the abductors. The piriformis tendon and short external rotators were visualized. Bovie electrocautery was used to cut these with the capsule as one L-shaped flap. This was tagged at the corner with #5 Ethibond. At this point, the femoral neck fracture was visualized. An oscillating saw was used to make a new neck cut approximately 18mabove the lesser tuberosity with the use of a neck cut guide. The head was then freed from its remaining soft tissue attachments and measured. The head trial was then inserted into the acetabulum and the appropriate sized head was selected.    We then turned our attention to preparing the femoral canal. First, a box cut was performed utilizing the box osteotome. A canal finder was inserted by hand and sequential broaching was then performed. The calcar planer was inserted onto the broach and used to smooth the calcar appropriately.  A trial stem, neutral neck, and head were inserted into the acetabulum and placed through range of motion. Intraoperative radiographs were obtained to assess component position and leg length. The hip was again dislocated and the femoral trial components were removed.  The actual stem was inserted into the femoral canal and then driven onto the calcar.  The trial head was then again inserted on the femoral component and found to be appropriate. The trial head was then removed and the permanent  head was Morse tapered onto the femoral stem and then reduced into the acetabulum.    The hip stability and length were reassessed and found to be satisfactory. The hip was stable in the position of sleep and in position of greater than 90 degrees hip flexion/internal rotation. The wound was then copiously irrigated with normal saline solution. The tagged sutures of the capsule and piriformis were sewn to the gluteus medius tendon. This adequately closed the hip capsule. The IT band and gluteus maximus fascia were then closed with 0-Vicryl in a running, locked fashion. A mixture of Exparil and bupivicaine was administered.  The subdermal layer was closed with 2-0 Vicryl in a buried interrupted fashion. Skin was approximated with staples.  Sterile dressing was applied.  An abduction pillow was placed. The patient was mobilized from the lateral position back to supine on the operating room table and then awakened from anesthesia without complication.  POSTOPERATIVE PLAN: The patient will be WBAT on operative extremity. Lovenox '40mg'$ /day x 4 weeks to start on POD#1. IV Abx x 24 hours. PT/OT on POD#1. Posterior hip precautions.

## 2020-11-29 NOTE — ED Notes (Signed)
Pt c/o pain to right hip. Dr Ellender Hose notified

## 2020-11-29 NOTE — Anesthesia Preprocedure Evaluation (Signed)
Anesthesia Evaluation  Patient identified by MRN, date of birth, ID band Patient awake    Reviewed: Allergy & Precautions, NPO status , Patient's Chart, lab work & pertinent test results  Airway Mallampati: II  TM Distance: >3 FB Neck ROM: full    Dental  (+) Edentulous Upper, Edentulous Lower   Pulmonary neg pulmonary ROS, former smoker,    Pulmonary exam normal        Cardiovascular hypertension, Normal cardiovascular exam     Neuro/Psych negative neurological ROS  negative psych ROS   GI/Hepatic Neg liver ROS, GERD  Medicated,  Endo/Other  negative endocrine ROS  Renal/GU      Musculoskeletal   Abdominal   Peds  Hematology  (+) Blood dyscrasia, anemia ,   Anesthesia Other Findings Past Medical History: No date: H/O vertigo No date: History of stomach ulcers No date: HOH (hard of hearing) No date: Hypertension No date: Insomnia No date: Wears dentures  Past Surgical History: 1999: APPENDECTOMY     Comment:  Dr.Byrnett 2010: [place] 09/13/2019: CATARACT EXTRACTION W/PHACO; Left     Comment:  Procedure: CATARACT EXTRACTION PHACO AND INTRAOCULAR               LENS PLACEMENT ([facility]) LEFT  5.06  00:48.8;  Surgeon:               Birder Robson, MD;  Location: [place];                Service: Ophthalmology;  Laterality: Left; 10/04/2019: CATARACT EXTRACTION W/PHACO; Right     Comment:  Procedure: CATARACT EXTRACTION PHACO AND INTRAOCULAR               LENS PLACEMENT (IOC) RIGHT;  Surgeon: Birder Robson,               MD;  Location: [place];  Service:               Ophthalmology;  Laterality: Right;  4.64 0:43.9 04/24/2015: ECTROPION REPAIR; Bilateral     Comment:  Procedure: REPAIR OF ECTROPION TARSAL WEDGE LEFT EYE               REPAIR EXTENSIVE BILATERAL;  Surgeon: Karle Starch, MD;                Location: [place];  Service: Ophthalmology;                 Laterality: Bilateral; 1999: HERNIA REPAIR     Comment:  Dr.Byrnett 04/24/2015: LACRIMAL DUCT EXPLORATION; Bilateral     Comment:  Procedure: LACRIMAL DUCT EXPLORATION PUNCTOPLASTY;                Surgeon: Karle Starch, MD;  Location: [place];  Service: Ophthalmology;  Laterality: Bilateral; No date: SEPTOPLASTY 1966: STOMACH SURGERY     Comment:  ulcer  BMI    Body Mass Index: 19.11 kg/m      Reproductive/Obstetrics negative OB ROS                             Anesthesia Physical Anesthesia Plan  ASA: 3  Anesthesia Plan: Spinal   Post-op Pain Management:    Induction:   PONV Risk Score and Plan:   Airway Management Planned:   Additional Equipment:   Intra-op Plan:   Post-operative Plan:  Informed Consent: I have reviewed the patients History and Physical, chart, labs and discussed the procedure including the risks, benefits and alternatives for the proposed anesthesia with the patient or authorized representative who has indicated his/her understanding and acceptance.     Dental Advisory Given  Plan Discussed with: Anesthesiologist, CRNA and Surgeon  Anesthesia Plan Comments:         Anesthesia Quick Evaluation

## 2020-11-29 NOTE — ED Notes (Signed)
Pt placed on external catheter

## 2020-11-29 NOTE — H&P (Signed)
ED  History and Physical    Jacob Moses [id number] DOB: 1928/02/09 DOA: 11/29/2020  Referring MD/NP/PA:   PCP: Albina Billet, MD   Patient coming from:  The patient is coming from home.  At baseline, pt is independent for most of ADL.        Chief Complaint: fall and right hip pain  HPI: Jacob Moses is a 87 y.o. male with medical history significant of HTN, anxiety, HOH, gastric ulcer, vertigo, anemia, insomnia, who presents with fall and right hip pain.   Per pt's daughter at bedside, pt fell when he was bending forward to pick up something on the ground and lost balance in this AM. No LOC.  Patient is not very sure if he injured his head or neck.  He fell onto his right hip. He developed pain in right hip, which is constant, sharp, severe, throbbing, nonradiating.  Patient does not have numbness or tingling in extremities.  No facial droop or slurred speech. Patient does not have chest pain, cough, shortness breath.  No nausea, vomiting, diarrhea or abdominal pain.  Patient is mildly constipated.  ED Course: pt was found to have WBC 8.2, troponin level 9, pending COVID-19 test, renal function close to baseline, temperature normal, blood pressure 187/85, bradycardia with heart rate 40-50s, RR 16, oxygen saturation 100% on room air.  Chest x-ray negative for infiltration.  X-ray of right hip/pelvis that showed comminuted right femoral neck fracture.  Patient is admitted to [facility] bed as inpatient.  Dr. Posey Pronto of Ortho and Dr. Ubaldo Glassing of cardiology are consulted.  Review of Systems:   General: no fevers, chills, no body weight gain, fatigue HEENT: no blurry vision, hearing changes or sore throat Respiratory: no dyspnea, coughing, wheezing CV: no chest pain, no palpitations GI: no nausea, vomiting, abdominal pain, diarrhea, has constipation GU: no dysuria, burning on urination, increased urinary frequency, hematuria  Ext: no leg edema Neuro: no unilateral weakness, numbness, or  tingling, no vision change or hearing loss. Has fall. Skin: no rash, no skin tear. MSK: has right hip pain Heme: No easy bruising.  Travel history: No recent long distant travel.  Allergy: No Known Allergies  Past Medical History:  Diagnosis Date   H/O vertigo    History of stomach ulcers    HOH (hard of hearing)    Hypertension    Insomnia    Wears dentures     Past Surgical History:  Procedure Laterality Date   APPENDECTOMY  1999   Dr.Byrnett   BELPHAROPTOSIS REPAIR  2010   CATARACT EXTRACTION W/PHACO Left 09/13/2019   Procedure: CATARACT EXTRACTION PHACO AND INTRAOCULAR LENS PLACEMENT ([facility]) LEFT  5.06  00:48.8;  Surgeon: Birder Robson, MD;  Location: [facility];  Service: Ophthalmology;  Laterality: Left;   CATARACT EXTRACTION W/PHACO Right 10/04/2019   Procedure: CATARACT EXTRACTION PHACO AND INTRAOCULAR LENS PLACEMENT ([place]) RIGHT;  Surgeon: Birder Robson, MD;  Location: [facility];  Service: Ophthalmology;  Laterality: Right;  4.64 0:43.9   ECTROPION REPAIR Bilateral 04/24/2015   Procedure: REPAIR OF ECTROPION TARSAL WEDGE LEFT EYE REPAIR EXTENSIVE BILATERAL;  Surgeon: Karle Starch, MD;  Location: [facility];  Service: Ophthalmology;  Laterality: Bilateral;   HERNIA REPAIR  1999   Dr.Byrnett   LACRIMAL DUCT EXPLORATION Bilateral 04/24/2015   Procedure: LACRIMAL DUCT EXPLORATION PUNCTOPLASTY;  Surgeon: Karle Starch, MD;  Location: [facility];  Service: Ophthalmology;  Laterality: Bilateral;   SEPTOPLASTY     STOMACH SURGERY  1966   ulcer    Social History:  reports that he quit smoking about 42 years ago. He has never used smokeless tobacco. He reports that he does not drink alcohol and does not use drugs.  Family History:  Family History  Problem Relation Age of Onset   Cancer Mother    Diabetes Father      Prior to Admission medications   Medication Sig Start Date End Date Taking? Authorizing Provider  ALPRAZolam (XANAX  XR) 0.5 MG 24 hr tablet Take 0.5 mg by mouth daily.    [provider]  amLODipine (NORVASC) 5 MG tablet Take 1 tablet (5 mg total) by mouth daily. 02/25/19   Demetrios Loll, MD  cetirizine (ZYRTEC) 10 MG tablet Take 10 mg by mouth daily after supper.    [provider]  Ferrous Sulfate (IRON PO) Take by mouth daily.    [provider]  gabapentin (NEURONTIN) 100 MG capsule Take 100 mg by mouth at bedtime.     [provider]  melatonin 3 MG TABS tablet Take 3 mg by mouth at bedtime as needed.    [provider]  pantoprazole (PROTONIX) 40 MG tablet Take 1 tablet (40 mg total) by mouth daily. 02/25/19   Demetrios Loll, MD  traZODone (DESYREL) 50 MG tablet Take 0.5 tablets (25 mg total) by mouth at bedtime as needed for sleep. 02/25/19   Demetrios Loll, MD    Physical Exam: Vitals:   11/29/20 [id number] 11/29/20 0834 11/29/20 0836 11/29/20 0900  BP:   (!) 187/85 (!) 178/74  Pulse:   (!) 53 (!) 50  Resp:   16 16  Temp:   98 F (36.7 C)   TempSrc:   Oral   SpO2: 100%   100%  Weight:  62.1 kg    Height:  '5\' 11"'$  (1.803 m)     General: Not in acute distress HEENT:       Eyes: PERRL, EOMI, no scleral icterus.       ENT: No discharge from the ears and nose, no pharynx injection, no tonsillar enlargement.        Neck: No JVD, no bruit, no mass felt. Heme: No neck lymph node enlargement. Cardiac: S1/S2, RRR, No murmurs, No gallops or rubs. Respiratory: No rales, wheezing, rhonchi or rubs. GI: Soft, nondistended, nontender, no rebound pain, no organomegaly, BS present. GU: No hematuria Ext: No pitting leg edema bilaterally. 1+DP/PT pulse bilaterally. Musculoskeletal: Tenderness in right hip.  Right leg is externally rotated Skin: No rashes.  Neuro: Alert, oriented X3, cranial nerves II-XII grossly intact, moves all extremities. Psych: Patient is not psychotic, no suicidal or hemocidal ideation.  Labs on Admission: I have personally reviewed following labs and  imaging studies  CBC: Recent Labs  Lab 11/29/20 0837  WBC 8.2  NEUTROABS 5.8  HGB 11.7*  HCT 35.9*  MCV 100.0  PLT [id number]   Basic Metabolic Panel: Recent Labs  Lab 11/29/20 0837  NA 136  K 4.7  CL 102  CO2 22  GLUCOSE 115*  BUN 31*  CREATININE 1.70*  CALCIUM 9.0   GFR: Estimated Creatinine Clearance: 24.9 mL/min (A) (by C-G formula based on SCr of 1.7 mg/dL (H)). Liver Function Tests: No results for input(s): AST, ALT, ALKPHOS, BILITOT, PROT, ALBUMIN in the last 168 hours. No results for input(s): LIPASE, AMYLASE in the last 168 hours. No results for input(s): AMMONIA in the last 168 hours. Coagulation Profile: Recent Labs  Lab 11/29/20 0837  INR  1.1   Cardiac Enzymes: No results for input(s): CKTOTAL, CKMB, CKMBINDEX, TROPONINI in the last 168 hours. BNP (last 3 results) No results for input(s): PROBNP in the last 8760 hours. HbA1C: No results for input(s): HGBA1C in the last 72 hours. CBG: No results for input(s): GLUCAP in the last 168 hours. Lipid Profile: No results for input(s): CHOL, HDL, LDLCALC, TRIG, CHOLHDL, LDLDIRECT in the last 72 hours. Thyroid Function Tests: No results for input(s): TSH, T4TOTAL, FREET4, T3FREE, THYROIDAB in the last 72 hours. Anemia Panel: No results for input(s): VITAMINB12, FOLATE, FERRITIN, TIBC, IRON, RETICCTPCT in the last 72 hours. Urine analysis:    Component Value Date/Time   COLORURINE YELLOW (A) 02/22/2019 1305   APPEARANCEUR HAZY (A) 02/22/2019 1305   LABSPEC 1.019 02/22/2019 1305   PHURINE 5.0 02/22/2019 1305   GLUCOSEU NEGATIVE 02/22/2019 1305   HGBUR LARGE (A) 02/22/2019 1305   BILIRUBINUR NEGATIVE 02/22/2019 1305   KETONESUR 5 (A) 02/22/2019 1305   PROTEINUR 100 (A) 02/22/2019 1305   NITRITE NEGATIVE 02/22/2019 1305   LEUKOCYTESUR NEGATIVE 02/22/2019 1305   Sepsis Labs: '@LABRCNTIP'$ (procalcitonin:4,lacticidven:4) )No results found for this or any previous visit (from the past 240 hour(s)).    Radiological Exams on Admission: DG Pelvis Portable  Result Date: 11/29/2020 CLINICAL DATA:  Closed right hip fracture EXAM: PORTABLE PELVIS 1-2 VIEWS COMPARISON:  None. FINDINGS: There is a right femoral neck fracture with varus angulation. No subluxation or dislocation. Hip joints are symmetric. IMPRESSION: Right femoral neck fracture with varus angulation. Electronically Signed   By: Rolm Baptise M.D.   On: 11/29/2020 10:17   DG Chest Port 1 View  Result Date: 11/29/2020 CLINICAL DATA:  87 year old male status post fall at home. Right femoral neck fracture. EXAM: PORTABLE CHEST 1 VIEW COMPARISON:  None. FINDINGS: Portable AP view at 0845 hours. Low lung volumes. Mild cardiomegaly. Other mediastinal contours are within normal limits. Visualized tracheal air column is within normal limits. Allowing for portable technique the lungs are clear. No pneumothorax or pleural effusion is evident. No acute osseous abnormality identified. IMPRESSION: Low lung volumes.  No acute cardiopulmonary abnormality. Electronically Signed   By: Genevie Ann M.D.   On: 11/29/2020 09:15   DG Hip Unilat  With Pelvis 2-3 Views Right  Result Date: 11/29/2020 CLINICAL DATA:  Fall. EXAM: DG HIP (WITH OR WITHOUT PELVIS) 2-3V RIGHT COMPARISON:  No prior. FINDINGS: Diffuse severe osteopenia and degenerative change. Comminuted right femoral neck fracture with angulation deformity noted. No evidence of dislocation. Pelvic calcifications consistent phleboliths. Dystrophic calcifications noted over the pelvis. IMPRESSION: 1. Comminuted right femoral neck fracture with angulation deformity. 2.  Severe diffuse osteopenia.  Diffuse degenerative change. Electronically Signed   By: Marcello Moores  Register   On: 11/29/2020 09:02     EKG: I have personally reviewed.  Sinus rhythm, QTC 441, bifascicular block, early R wave progression  Assessment/Plan Principal Problem:   Closed right hip fracture (HCC) Active Problems:   Fall at home, initial  encounter   Bradycardia   HTN (hypertension)   GERD (gastroesophageal reflux disease)   Anxiety   CKD (chronic kidney disease), stage IIIb   Iron deficiency anemia   Closed right hip fracture ([place]):  As evidenced by x-ray. Patient has moderate pain now. No neurovascular compromise. Orthopedic surgeon, Dr. Posey Pronto was consulted. No hx of cardiac issue, EF>55% by 2d echo on 05/11/19, but he has new bradycardia with HR 40-50s.  Given his old age and new bradycardia, I consulted cardiology, Dr. Ubaldo Glassing for presurgical  clearance.  - will admit to Med-surg bed as inpt - Pain control: morphine prn and percocet - When necessary Zofran for nausea - Robaxin for muscle spasm - type and cross - INR/PTT - PT/OT when able to (not ordered now)  Fall at home, initial encounter:  -PT/OT when able to -f/u CT of head and neck  Bradycardia: EKG showed sinus bradycardia with bifascicular block.  Chart review showed that patient had heart rate 67--75 on 02/22/2019 and 72 on 12/30/2019.  His bradycardia seems to be new.  Patient is asymptomatic.  No chest pain or shortness of breath. Consulted cardiology, Dr. Ubaldo Glassing. -Cardiac monitoring -Check TSH  HTN (hypertension): -Amlodipine -IV hydralazine as needed  Anxiety -Continue home Xanax  CKD (chronic kidney disease), stage IIIb: Baseline creatinine 1.8-1.9.  His creatinine is 1.70, BUN 31, stable. -Follow-up renal function by BMP  Iron deficiency anemia: Hemoglobin stable, 11.7 today.  Currently patient is not taking iron supplement. -f/u by CBC     DVT ppx: SCD Code Status: DNR per pt and his daughter Family Communication:  Yes, patient's daughter   at bed side Disposition Plan:  Anticipate discharge back to previous environment Consults called:  Dr. Posey Pronto of Ortho and Dr. Ubaldo Glassing of cardiology are consulted. Admission status and Level of care: Med-Surg:     as inpt       Status is: Inpatient  Remains inpatient appropriate because:Inpatient level of  care appropriate due to severity of illness  Dispo: The patient is from: Home              Anticipated d/c is to: to be determined              Patient currently is not medically stable to d/c.   Difficult to place patient No           Date of Service 11/29/2020    [facility]   If 7PM-7AM, please contact night-coverage [email or web address] 11/29/2020, 10:57 AM

## 2020-11-29 NOTE — Progress Notes (Signed)
Chaplain Maggie made initial visit to meet patient at bedside. Patient's daughter was present during entire visit. Space was made for prayer and empathetic listening. Both appreciated the visit and look forward to a "speedy recovery". Chaplain available for continued support as needed per on call chaplain at [phone].

## 2020-11-29 NOTE — Consult Note (Signed)
ORTHOPAEDIC CONSULTATION  REQUESTING PHYSICIAN: Ivor Costa, MD  Chief Complaint:   R hip pain  History of Present Illness: History obtained from patient's daughter at bedside, patient himself, and review of medical records. Patient is hard of hearing at baseline.  Patient's medical history significant for kidney disease and hypertension.  Jacob Moses is a 87 y.o. male who had a fall earlier today after losing his balance.  The patient noted immediate hip pain and inability to ambulate.  The patient ambulates unassisted at baseline and walks at least 1 mile daily. He occasionally uses a cane.  The patient lives independently at home. Pain is worse with any sort of movement.  X-rays in the emergency department show a right displaced femoral neck fracture.  He has been NPO since ~530am when he had coffee with a small amount of artificial creamer.   Past Medical History:  Diagnosis Date   H/O vertigo    History of stomach ulcers    HOH (hard of hearing)    Hypertension    Insomnia    Wears dentures    Past Surgical History:  Procedure Laterality Date   APPENDECTOMY  1999   [place]  2010   CATARACT EXTRACTION W/PHACO Left 09/13/2019   Procedure: CATARACT EXTRACTION PHACO AND INTRAOCULAR LENS PLACEMENT ([facility]) LEFT  5.06  00:48.8;  Surgeon: Birder Robson, MD;  Location: [place];  Service: Ophthalmology;  Laterality: Left;   CATARACT EXTRACTION W/PHACO Right 10/04/2019   Procedure: CATARACT EXTRACTION PHACO AND INTRAOCULAR LENS PLACEMENT ([place]) RIGHT;  Surgeon: Birder Robson, MD;  Location: [place];  Service: Ophthalmology;  Laterality: Right;  4.64 0:43.9   ECTROPION REPAIR Bilateral 04/24/2015   Procedure: REPAIR OF ECTROPION TARSAL WEDGE LEFT EYE REPAIR EXTENSIVE BILATERAL;  Surgeon: Karle Starch, MD;  Location: [place];  Service: Ophthalmology;   Laterality: Bilateral;   HERNIA REPAIR  1999   Dr.Byrnett   LACRIMAL DUCT EXPLORATION Bilateral 04/24/2015   Procedure: LACRIMAL DUCT EXPLORATION PUNCTOPLASTY;  Surgeon: Karle Starch, MD;  Location: [place];  Service: Ophthalmology;  Laterality: Bilateral;   SEPTOPLASTY     STOMACH SURGERY  1966   ulcer   Social History   Socioeconomic History   Marital status: Widowed    Spouse name: Not on file   Number of children: Not on file   Years of education: Not on file   Highest education level: Not on file  Occupational History   Not on file  Tobacco Use   Smoking status: Former    Years: 30.00    Types: Cigarettes    Quit date: 05/05/1978    Years since quitting: 42.6   Smokeless tobacco: Never  Vaping Use   Vaping Use: Never used  Substance and Sexual Activity   Alcohol use: No   Drug use: No   Sexual activity: Not on file  Other Topics Concern   Not on file  Social History Narrative   Not on file   Social Determinants of Health   Financial Resource Strain: Not on file  Food Insecurity: Not on file  Transportation Needs: Not on file  Physical Activity: Not on file  Stress: Not on file  Social Connections: Not on file   Family History  Problem Relation Age of Onset   Cancer Mother    Diabetes Father    No Known Allergies Prior to Admission medications   Medication Sig Start Date End Date Taking? Authorizing Provider  ALPRAZolam (XANAX XR) 0.5 MG 24 hr tablet Take 0.5 mg by mouth daily.   Yes [provider]  amLODipine (NORVASC) 5 MG tablet Take 1 tablet (5 mg total) by mouth daily. 02/25/19  Yes Demetrios Loll, MD  Multiple Vitamins-Minerals (MULTIVITAMIN WITH MINERALS) tablet Take 1 tablet by mouth daily.   Yes [provider]  traZODone (DESYREL) 50 MG tablet Take 0.5 tablets (25 mg total) by mouth at bedtime as needed for sleep. 02/25/19  Yes Demetrios Loll, MD   Recent Labs    11/29/20 0837  WBC 8.2  HGB 11.7*  HCT 35.9*  PLT 269   K 4.7  CL 102  CO2 22  BUN 31*  CREATININE 1.70*  GLUCOSE 115*  CALCIUM 9.0  INR 1.1   CT Head Wo Contrast  Result Date: 11/29/2020 CLINICAL DATA:  Neck trauma (Age >= 65y); Head trauma, minor (Age >= 65y) EXAM: CT HEAD WITHOUT CONTRAST CT CERVICAL SPINE WITHOUT CONTRAST TECHNIQUE: Multidetector CT imaging of the head and cervical spine was performed following the standard protocol without intravenous contrast. Multiplanar CT image reconstructions of the cervical spine were also generated. COMPARISON:  12/30/2019. FINDINGS: CT HEAD FINDINGS Brain: In comparison to 12/30/2019, no substantial change in the size of a 12 mm mixed density left subdural collection. There is a small amount of hyperdensity within the collection which could represent recent hemorrhage. No significant mass effect or midline shift. No clearly new hemorrhage. No evidence of acute large vascular territory infarct. Similar patchy white matter hypoattenuation, most likely related to chronic microvascular ischemic disease. No hydrocephalus. No mass lesion. Vascular: No hyperdense vessel identified. Calcific intracranial atherosclerosis. Skull: No acute fracture. Sinuses/Orbits: Polyp or retention cyst in the inferior left maxillary sinus. Mild ethmoid air cell mucosal thickening. Otherwise, sinuses are clear. Unremarkable orbits. Other: No mastoid effusions. CT CERVICAL SPINE FINDINGS Alignment: Similar alignment. Similar mild reversal the cervical lordosis. Similar slight widening of the C2-C3 disc space anteriorly. No substantial sagittal subluxation. Mild broad dextrocurvature. Skull base and vertebrae: Similar appearance of extensive ankylosis throughout the cervical spine from C2 inferiorly. No evidence of acute fracture. Vertebral body heights are similar to prior. Diffuse osteopenia. Soft tissues and spinal canal: No prevertebral fluid or swelling. No visible canal hematoma. Disc levels: Similar partially calcified central disc  protrusion at C2-C3. Multilevel facet and uncovertebral hypertrophy. Multi level posterior osteophytic spurring which narrows the canal without evidence of high-grade bony canal stenosis. Upper chest: Biapical pleuroparenchymal scarring. Otherwise, visualized lung apices are clear. IMPRESSION: CT Head: 1. In comparison to 12/30/2019, no substantial change in size of a 12 mm mixed density left subdural collection. There is a small amount of hyperdensity within the collection, which may represent recent hemorrhage. No significant mass effect or midline shift. 2. Otherwise, no new acute abnormality. CT Cervical Spine: 1. No evidence of acute fracture or traumatic malalignment. 2. Similar appearance of extensive ankylosis throughout the cervical spine from C2 inferiorly. 3. Diffuse osteopenia. Electronically Signed   By: Margaretha Sheffield MD   On: 11/29/2020 11:40   CT Cervical Spine Wo Contrast  Result Date: 11/29/2020 CLINICAL DATA:  Neck trauma (Age >= 65y); Head trauma, minor (Age >= 65y) EXAM: CT HEAD WITHOUT CONTRAST CT CERVICAL SPINE WITHOUT CONTRAST TECHNIQUE: Multidetector CT imaging of the head and cervical spine was performed following the standard protocol without intravenous contrast. Multiplanar CT image reconstructions of the cervical spine were also generated. COMPARISON:  12/30/2019. FINDINGS: CT HEAD FINDINGS Brain: In comparison to 12/30/2019, no  substantial change in the size of a 12 mm mixed density left subdural collection. There is a small amount of hyperdensity within the collection which could represent recent hemorrhage. No significant mass effect or midline shift. No clearly new hemorrhage. No evidence of acute large vascular territory infarct. Similar patchy white matter hypoattenuation, most likely related to chronic microvascular ischemic disease. No hydrocephalus. No mass lesion. Vascular: No hyperdense vessel identified. Calcific intracranial atherosclerosis. Skull: No acute fracture.  Sinuses/Orbits: Polyp or retention cyst in the inferior left maxillary sinus. Mild ethmoid air cell mucosal thickening. Otherwise, sinuses are clear. Unremarkable orbits. Other: No mastoid effusions. CT CERVICAL SPINE FINDINGS Alignment: Similar alignment. Similar mild reversal the cervical lordosis. Similar slight widening of the C2-C3 disc space anteriorly. No substantial sagittal subluxation. Mild broad dextrocurvature. Skull base and vertebrae: Similar appearance of extensive ankylosis throughout the cervical spine from C2 inferiorly. No evidence of acute fracture. Vertebral body heights are similar to prior. Diffuse osteopenia. Soft tissues and spinal canal: No prevertebral fluid or swelling. No visible canal hematoma. Disc levels: Similar partially calcified central disc protrusion at C2-C3. Multilevel facet and uncovertebral hypertrophy. Multi level posterior osteophytic spurring which narrows the canal without evidence of high-grade bony canal stenosis. Upper chest: Biapical pleuroparenchymal scarring. Otherwise, visualized lung apices are clear. IMPRESSION: CT Head: 1. In comparison to 12/30/2019, no substantial change in size of a 12 mm mixed density left subdural collection. There is a small amount of hyperdensity within the collection, which may represent recent hemorrhage. No significant mass effect or midline shift. 2. Otherwise, no new acute abnormality. CT Cervical Spine: 1. No evidence of acute fracture or traumatic malalignment. 2. Similar appearance of extensive ankylosis throughout the cervical spine from C2 inferiorly. 3. Diffuse osteopenia. Electronically Signed   By: Margaretha Sheffield MD   On: 11/29/2020 11:40   DG Pelvis Portable  Result Date: 11/29/2020 CLINICAL DATA:  Closed right hip fracture EXAM: PORTABLE PELVIS 1-2 VIEWS COMPARISON:  None. FINDINGS: There is a right femoral neck fracture with varus angulation. No subluxation or dislocation. Hip joints are symmetric. IMPRESSION:  Right femoral neck fracture with varus angulation. Electronically Signed   By: Rolm Baptise M.D.   On: 11/29/2020 10:17   DG Chest Port 1 View  Result Date: 11/29/2020 CLINICAL DATA:  87 year old male status post fall at home. Right femoral neck fracture. EXAM: PORTABLE CHEST 1 VIEW COMPARISON:  None. FINDINGS: Portable AP view at 0845 hours. Low lung volumes. Mild cardiomegaly. Other mediastinal contours are within normal limits. Visualized tracheal air column is within normal limits. Allowing for portable technique the lungs are clear. No pneumothorax or pleural effusion is evident. No acute osseous abnormality identified. IMPRESSION: Low lung volumes.  No acute cardiopulmonary abnormality. Electronically Signed   By: Genevie Ann M.D.   On: 11/29/2020 09:15   DG Hip Unilat  With Pelvis 2-3 Views Right  Result Date: 11/29/2020 CLINICAL DATA:  Fall. EXAM: DG HIP (WITH OR WITHOUT PELVIS) 2-3V RIGHT COMPARISON:  No prior. FINDINGS: Diffuse severe osteopenia and degenerative change. Comminuted right femoral neck fracture with angulation deformity noted. No evidence of dislocation. Pelvic calcifications consistent phleboliths. Dystrophic calcifications noted over the pelvis. IMPRESSION: 1. Comminuted right femoral neck fracture with angulation deformity. 2.  Severe diffuse osteopenia.  Diffuse degenerative change. Electronically Signed   By: Marcello Moores  Register   On: 11/29/2020 09:02     Positive ROS: All other systems have been reviewed and were otherwise negative with the exception of those mentioned in the  HPI and as above.  Physical Exam: BP (!) 178/74   Pulse (!) 50   Temp 98 F (36.7 C) (Oral)   Resp 16   Ht '5\' 11"'$  (1.803 m)   Wt 62.1 kg   SpO2 100%   BMI 19.11 kg/m  General:  Alert, no acute distress Psychiatric:  Patient is competent for consent with normal mood and affect   Cardiovascular:  No pedal edema, regular rate and rhythm Respiratory:  No wheezing, non-labored breathing GI:  Abdomen  is soft and non-tender Skin:  No lesions in the area of chief complaint, no erythema Neurologic:  Sensation intact distally, CN grossly intact Lymphatic:  No axillary or cervical lymphadenopathy   Orthopedic Exam:  RLE: + DF/PF/EHL SILT grossly over foot Foot wwp +Log roll/axial load Leg shortened and externally rotated   X-rays:  As above: R displaced femoral neck fracture  Assessment/Plan: Jacob Moses is a 87 y.o. male with a R displaced femoral neck fracture 1. I discussed the various treatment options including both surgical and non-surgical management of the fracture with the patient and/or family (medical PoA). We discussed the high risk of perioperative complications due to patient's age and other co-morbidities. After discussion of risks, benefits, and alternatives to surgery, the family and/or patient were in agreement to proceed with surgery. The goals of surgery would be to provide adequate pain relief and allow for mobilization. Plan for surgery is R hip hemiarthroplasty today, 11/29/2020. 2. NPO until OR 3. Hold anticoagulation in advance of OR 4. Patient medically optimized per Cardiology and Hospitalist teams.      Leim Fabry   11/29/2020 12:57 PM

## 2020-11-29 NOTE — Plan of Care (Signed)
  Problem: Education: Goal: Knowledge of General Education information will improve Description: Including pain rating scale, medication(s)/side effects and non-pharmacologic comfort measures 11/29/2020 1803 by Elsie Ra, RN Outcome: Progressing 11/29/2020 1720 by Elsie Ra, RN Outcome: Progressing   Problem: Health Behavior/Discharge Planning: Goal: Ability to manage health-related needs will improve 11/29/2020 1803 by Elsie Ra, RN Outcome: Progressing 11/29/2020 1720 by Elsie Ra, RN Outcome: Progressing   Problem: Clinical Measurements: Goal: Ability to maintain clinical measurements within normal limits will improve 11/29/2020 1803 by Elsie Ra, RN Outcome: Progressing 11/29/2020 1720 by Elsie Ra, RN Outcome: Progressing Goal: Will remain free from infection 11/29/2020 1803 by Elsie Ra, RN Outcome: Progressing 11/29/2020 1720 by Elsie Ra, RN Outcome: Progressing Goal: Diagnostic test results will improve 11/29/2020 1803 by Elsie Ra, RN Outcome: Progressing 11/29/2020 1720 by Elsie Ra, RN Outcome: Progressing Goal: Respiratory complications will improve 11/29/2020 1803 by Elsie Ra, RN Outcome: Progressing 11/29/2020 1720 by Elsie Ra, RN Outcome: Progressing Goal: Cardiovascular complication will be avoided 11/29/2020 1803 by Elsie Ra, RN Outcome: Progressing 11/29/2020 1720 by Elsie Ra, RN Outcome: Progressing   Problem: Activity: Goal: Risk for activity intolerance will decrease 11/29/2020 1803 by Elsie Ra, RN Outcome: Progressing 11/29/2020 1720 by Elsie Ra, RN Outcome: Progressing   Problem: Nutrition: Goal: Adequate nutrition will be maintained 11/29/2020 1803 by Elsie Ra, RN Outcome: Progressing 11/29/2020 1720 by Elsie Ra, RN Outcome: Progressing   Problem: Coping: Goal: Level of anxiety will  decrease 11/29/2020 1803 by Elsie Ra, RN Outcome: Progressing 11/29/2020 1720 by Elsie Ra, RN Outcome: Progressing   Problem: Elimination: Goal: Will not experience complications related to bowel motility 11/29/2020 1803 by Elsie Ra, RN Outcome: Progressing 11/29/2020 1720 by Elsie Ra, RN Outcome: Progressing Goal: Will not experience complications related to urinary retention 11/29/2020 1803 by Elsie Ra, RN Outcome: Progressing 11/29/2020 1720 by Elsie Ra, RN Outcome: Progressing   Problem: Pain Managment: Goal: General experience of comfort will improve 11/29/2020 1803 by Elsie Ra, RN Outcome: Progressing 11/29/2020 1720 by Elsie Ra, RN Outcome: Progressing   Problem: Safety: Goal: Ability to remain free from injury will improve 11/29/2020 1803 by Elsie Ra, RN Outcome: Progressing 11/29/2020 1720 by Elsie Ra, RN Outcome: Progressing   Problem: Skin Integrity: Goal: Risk for impaired skin integrity will decrease 11/29/2020 1803 by Elsie Ra, RN Outcome: Progressing 11/29/2020 1720 by Elsie Ra, RN Outcome: Progressing

## 2020-11-29 NOTE — ED Triage Notes (Addendum)
Pt to ED [facility] from home for fall off couch reaching for meds. Rotation to right leg. Unable to lift leg.  Pt alert and oriented.  Pedal pulses felt

## 2020-11-30 DIAGNOSIS — D72828 Other elevated white blood cell count: Secondary | ICD-10-CM

## 2020-11-30 DIAGNOSIS — E875 Hyperkalemia: Secondary | ICD-10-CM

## 2020-11-30 DIAGNOSIS — S72001A Fracture of unspecified part of neck of right femur, initial encounter for closed fracture: Principal | ICD-10-CM

## 2020-11-30 LAB — CBC
HCT: 31.7 % — ABNORMAL LOW (ref 39.0–52.0)
Hemoglobin: 10.6 g/dL — ABNORMAL LOW (ref 13.0–17.0)
MCH: 33 pg (ref 26.0–34.0)
MCHC: 33.4 g/dL (ref 30.0–36.0)
MCV: 98.8 fL (ref 80.0–100.0)
Platelets: 268 10*3/uL (ref 150–400)
RBC: 3.21 MIL/uL — ABNORMAL LOW (ref 4.22–5.81)
RDW: 12 % (ref 11.5–15.5)
WBC: 19.6 10*3/uL — ABNORMAL HIGH (ref 4.0–10.5)
nRBC: 0 % (ref 0.0–0.2)

## 2020-11-30 LAB — BASIC METABOLIC PANEL
Anion gap: 6 (ref 5–15)
BUN: 29 mg/dL — ABNORMAL HIGH (ref 8–23)
CO2: 25 mmol/L (ref 22–32)
Calcium: 8.6 mg/dL — ABNORMAL LOW (ref 8.9–10.3)
Chloride: 103 mmol/L (ref 98–111)
Creatinine, Ser: 1.52 mg/dL — ABNORMAL HIGH (ref 0.61–1.24)
GFR, Estimated: 43 mL/min — ABNORMAL LOW (ref >60)
Glucose, Bld: 158 mg/dL — ABNORMAL HIGH (ref 70–99)
Potassium: 5.6 mmol/L — ABNORMAL HIGH (ref 3.5–5.1)
Sodium: 134 mmol/L — ABNORMAL LOW (ref 135–145)

## 2020-11-30 LAB — POTASSIUM: Potassium: 5.1 mmol/L (ref 3.5–5.1)

## 2020-11-30 MED ORDER — ENOXAPARIN SODIUM 30 MG/0.3ML IJ SOSY
30.0000 mg | PREFILLED_SYRINGE | INTRAMUSCULAR | Status: DC
  Administered 2020-11-30 – 2020-12-03 (×4): 30 mg via SUBCUTANEOUS
  Filled 2020-11-30 (×4): qty 0.3

## 2020-11-30 MED ORDER — TRAMADOL HCL 50 MG PO TABS: 50.0000 mg | ORAL_TABLET | Freq: Four times a day (QID) | ORAL | 0 refills | Status: DC | PRN

## 2020-11-30 MED ORDER — ENOXAPARIN SODIUM 40 MG/0.4ML IJ SOSY: 40.0000 mg | PREFILLED_SYRINGE | INTRAMUSCULAR | 0 refills | Status: DC

## 2020-11-30 MED ORDER — OXYCODONE HCL 5 MG PO TABS: 2.5000 mg | ORAL_TABLET | ORAL | 0 refills | Status: DC | PRN

## 2020-11-30 MED ORDER — SODIUM ZIRCONIUM CYCLOSILICATE 10 G PO PACK
10.0000 g | PACK | Freq: Once | ORAL | Status: AC
  Administered 2020-11-30: 10 g via ORAL
  Filled 2020-11-30: qty 1

## 2020-11-30 MED ORDER — FUROSEMIDE 10 MG/ML IJ SOLN
40.0000 mg | Freq: Once | INTRAMUSCULAR | Status: AC
  Administered 2020-11-30: 40 mg via INTRAVENOUS
  Filled 2020-11-30: qty 4

## 2020-11-30 MED ORDER — ONDANSETRON HCL 4 MG PO TABS: 4.0000 mg | ORAL_TABLET | Freq: Four times a day (QID) | ORAL | 0 refills | Status: DC | PRN

## 2020-11-30 NOTE — Discharge Instructions (Signed)

## 2020-11-30 NOTE — Evaluation (Signed)
Physical Therapy Evaluation Patient Details Name: Jacob Moses MRN: [id number] DOB: 07/22/1927 Today's Date: 11/30/2020   History of Present Illness  Jacob Moses is a 87 y.o. male with medical history significant of HTN, anxiety, HOH, gastric ulcer, vertigo, anemia, insomnia, who presents with fall and right hip pain.  S/P right hip hemiarthroplasty.   Clinical Impression  Patient received in bed, daughter at bedside. He is quite HOH. Patient agreeable to PT/OT session. Patient required mod +2 assist for bed mobility. (Max to bring LEs off bed.) Has significant pain in R LE with mobility. Patient requires mod +2 assist for sit to stand from elevated bed and from [facility]. Patient requires cues for hand placement and to lean forward to stand. He requires max cues and assist for pivoting to recliner and to [facility]. Patient will continue to benefit from skilled PT while here to improve functional mobility, safety and strength.     Follow Up Recommendations SNF;Supervision/Assistance - 24 hour    Equipment Recommendations  Rolling walker with 5" wheels    Recommendations for Other Services       Precautions / Restrictions Precautions Precautions: Posterior Hip;Fall Precaution Booklet Issued: No Precaution Comments: Abduction pillow Restrictions Weight Bearing Restrictions: Yes RLE Weight Bearing: Weight bearing as tolerated      Mobility  Bed Mobility Overal bed mobility: Needs Assistance Bed Mobility: Supine to Sit     Supine to sit: Mod assist;+2 for physical assistance          Transfers Overall transfer level: Needs assistance Equipment used: Rolling walker (2 wheeled) Transfers: Sit to/from [facility] Sit to Stand: Mod assist;+2 physical assistance;+2 safety/equipment;From elevated surface Stand pivot transfers: Mod assist;+2 physical assistance;+2 safety/equipment          Ambulation/Gait Ambulation/Gait assistance: Mod assist;+2 physical  assistance;+2 safety/equipment Gait Distance (Feet): 5 Feet Assistive device: Rolling walker (2 wheeled) Gait Pattern/deviations: Step-to pattern;Decreased step length - right;Decreased step length - left;Trunk flexed;Decreased weight shift to right Gait velocity: decr   General Gait Details: patient requires assistance and cues for safety, walker mobility and for sequencing.  Stairs            Wheelchair Mobility    Modified Rankin (Stroke Patients Only)       Balance Overall balance assessment: Needs assistance Sitting-balance support: Feet supported Sitting balance-Leahy Scale: Good     Standing balance support: Bilateral upper extremity supported;During functional activity Standing balance-Leahy Scale: Fair Standing balance comment: patient reliant on B UE support and mod assist                             Pertinent Vitals/Pain Pain Assessment: Faces Faces Pain Scale: Hurts whole lot Pain Location: R LE with movement Pain Descriptors / Indicators: Aching;Discomfort;Sore Pain Intervention(s): Monitored during session;Premedicated before session;Repositioned    Home Living Family/patient expects to be discharged to:: Skilled nursing facility Living Arrangements: Alone Available Help at Discharge: Family;Available PRN/intermittently Type of Home: House Home Access: Stairs to enter   Entrance Stairs-Number of Steps: 1 Home Layout: Two level;Able to live on main level with bedroom/bathroom Home Equipment: Gilford Rile - 2 wheels      Prior Function Level of Independence: Independent         Comments: walks at the mall, drives     Hand Dominance   Dominant Hand: Right    Extremity/Trunk Assessment   Upper Extremity Assessment Upper Extremity Assessment: Defer to OT evaluation  Lower Extremity Assessment Lower Extremity Assessment: RLE deficits/detail RLE: Unable to fully assess due to pain RLE Coordination: decreased gross motor     Cervical / Trunk Assessment Cervical / Trunk Assessment: Normal  Communication   Communication: HOH  Cognition Arousal/Alertness: Awake/alert Behavior During Therapy: WFL for tasks assessed/performed Overall Cognitive Status: Within Functional Limits for tasks assessed                                        General Comments      Exercises     Assessment/Plan    PT Assessment Patient needs continued PT services  PT Problem List Decreased strength;Decreased mobility;Decreased safety awareness;Decreased knowledge of precautions;Decreased coordination;Decreased activity tolerance;Decreased balance;Decreased knowledge of use of DME;Pain;Decreased range of motion       PT Treatment Interventions DME instruction;Therapeutic exercise;Gait training;Balance training;Stair training;Neuromuscular re-education;Functional mobility training;Therapeutic activities;Patient/family education    PT Goals (Current goals can be found in the Care Plan section)  Acute Rehab PT Goals Patient Stated Goal: patient wants to go home PT Goal Formulation: With patient Time For Goal Achievement: 12/14/20 Potential to Achieve Goals: Fair    Frequency BID   Barriers to discharge Decreased caregiver support      Co-evaluation PT/OT/SLP Co-Evaluation/Treatment: Yes Reason for Co-Treatment: For patient/therapist safety;Necessary to address cognition/behavior during functional activity;To address functional/ADL transfers PT goals addressed during session: Mobility/safety with mobility;Balance;Proper use of DME         AM-PAC PT "6 Clicks" Mobility  Outcome Measure Help needed turning from your back to your side while in a flat bed without using bedrails?: A Lot Help needed moving from lying on your back to sitting on the side of a flat bed without using bedrails?: A Lot Help needed moving to and from a bed to a chair (including a wheelchair)?: A Lot Help needed standing up from a  chair using your arms (e.g., wheelchair or bedside chair)?: A Lot Help needed to walk in hospital room?: A Lot Help needed climbing 3-5 steps with a railing? : Total 6 Click Score: 11    End of Session Equipment Utilized During Treatment: Gait belt Activity Tolerance: Patient limited by pain;Patient limited by fatigue Patient left: in chair;with chair alarm set;with call bell/phone within reach;with family/visitor present Nurse Communication: Mobility status PT Visit Diagnosis: Unsteadiness on feet (R26.81);History of falling (Z91.81);Muscle weakness (generalized) (M62.81);Other abnormalities of gait and mobility (R26.89);Difficulty in walking, not elsewhere classified (R26.2);Pain Pain - Right/Left: Right Pain - part of body: Leg;Hip    Time: 1040-1111 PT Time Calculation (min) (ACUTE ONLY): 31 min   Charges:   PT Evaluation $PT Eval Moderate Complexity: 1 Mod PT Treatments $Gait Training: 8-22 mins        [employer], PT, [facility] 11/30/20,11:23 AM

## 2020-11-30 NOTE — TOC Progression Note (Signed)
Transition of Care [facility]) - Progression Note    Patient Details  Name: Jacob Moses MRN: [id number] Date of Birth: April 01, 1928  Transition of [facility]) CM/SW [place], RN Phone Number: 11/30/2020, 2:48 PM  Clinical Narrative:     Met with the patient and his daughter at the bedside, They are agreeable to go to [facility] SNF, He has had 2 covid vaccines and 1 booster, Bedsearch sent, will review once obtained        Expected Discharge Plan and Services                                                 Social Determinants of Health (SDOH) Interventions    Readmission Risk Interventions No flowsheet data found.

## 2020-11-30 NOTE — Progress Notes (Signed)
PROGRESS NOTE    Jacob Moses  [id number] DOB: [birth year] DOA: 11/29/2020 PCP: Albina Billet, MD   Assessment & Plan:   Principal Problem:   Closed right hip fracture [facility]) Active Problems:   Fall at home, initial encounter   Bradycardia   HTN (hypertension)   Anxiety   CKD (chronic kidney disease), stage IIIb   Iron deficiency anemia  Closed right hip fracture: secondary to fall at home. S/p right hip hemiarthroplasty. Percocet, morphine prn for pain. Ortho surg following and recs apprec. PT/OT consulted   Hyperkalemia: lasix x1 and lokelma x 1 ordered. Repeat potassium level ordered   Leukocytosis: likely reactive. Will continue to monitor   Bradycardia: asymptomatic.  No chest pain or shortness of breath.    HTN: continue on amlodipine. IV hydralazine prn   Anxiety: severity unknown. Continue on home dose of xanax   CKDIIIb: baseline Cr 1.8-1.9. Cr is better than baseline today    Iron deficiency anemia: not taking iron supplements at home. No need for a transfusion at this time      DVT prophylaxis: lovenox  Code Status: DNR Family Communication: discussed w/ pt's family at bedside and answered their questions  Disposition Plan: depends on PT/OT recs  Level of care: Med-Surg  Status is: Inpatient  Status is: Inpatient  Remains inpatient appropriate because:Unsafe d/c plan, IV treatments appropriate due to intensity of illness or inability to take PO, and Inpatient level of care appropriate due to severity of illness  Dispo: The patient is from: Home              Anticipated d/c is to: SNF              Patient currently is not medically stable to d/c.   Difficult to place patient : unclear    Consultants:  Ortho surg   Procedures:   Antimicrobials:    Subjective: Pt c/o hip pain   Objective: Vitals:   11/29/20 1822 11/29/20 2011 11/29/20 2337 11/30/20 0524  BP: 138/86 (!) 145/80 (!) 157/82 138/75  Pulse: 74 76 65 61  Resp: '16 18 18 16   '$ Temp: 97.9 F (36.6 C) 98.7 F (37.1 C) 98.4 F (36.9 C) 98.4 F (36.9 C)  TempSrc:      SpO2: 96% 95% 96% 97%  Weight:      Height:        Intake/Output Summary (Last 24 hours) at 11/30/2020 0727 Last data filed at 11/30/2020 0421 Gross per 24 hour  Intake 1639.37 ml  Output 1450 ml  Net 189.37 ml   Filed Weights   11/29/20 0834  Weight: 62.1 kg    Examination:  General exam: Appears calm and comfortable  Respiratory system: Clear to auscultation. Respiratory effort normal. Cardiovascular system: S1 & S2 +. No rubs, gallops or clicks.  Gastrointestinal system: Abdomen is nondistended, soft and nontender. Hypoactive bowel sounds  Central nervous system: Alert and awake. Moves all extremities  Psychiatry: Judgement and insight appear normal. Flat mood and affect.     Data Reviewed: I have personally reviewed following labs and imaging studies  CBC: Recent Labs  Lab 11/29/20 0837 11/30/20 0613  WBC 8.2 19.6*  NEUTROABS 5.8  --   HGB 11.7* 10.6*  HCT 35.9* 31.7*  MCV 100.0 98.8  PLT 269 [id number]   Basic Metabolic Panel: Recent Labs  Lab 11/29/20 0837 11/30/20 0613  NA 136 134*  K 4.7 5.6*  CL 102 103  CO2 22 25  GLUCOSE  115* 158*  BUN 31* 29*  CREATININE 1.70* 1.52*  CALCIUM 9.0 8.6*   GFR: Estimated Creatinine Clearance: 27.8 mL/min (A) (by C-G formula based on SCr of 1.52 mg/dL (H)). Liver Function Tests: No results for input(s): AST, ALT, ALKPHOS, BILITOT, PROT, ALBUMIN in the last 168 hours. No results for input(s): LIPASE, AMYLASE in the last 168 hours. No results for input(s): AMMONIA in the last 168 hours. Coagulation Profile: Recent Labs  Lab 11/29/20 0837  INR 1.1   Cardiac Enzymes: No results for input(s): CKTOTAL, CKMB, CKMBINDEX, TROPONINI in the last 168 hours. BNP (last 3 results) No results for input(s): PROBNP in the last 8760 hours. HbA1C: No results for input(s): HGBA1C in the last 72 hours. CBG: No results for input(s):  GLUCAP in the last 168 hours. Lipid Profile: No results for input(s): CHOL, HDL, LDLCALC, TRIG, CHOLHDL, LDLDIRECT in the last 72 hours. Thyroid Function Tests: No results for input(s): TSH, T4TOTAL, FREET4, T3FREE, THYROIDAB in the last 72 hours. Anemia Panel: No results for input(s): VITAMINB12, FOLATE, FERRITIN, TIBC, IRON, RETICCTPCT in the last 72 hours. Sepsis Labs: No results for input(s): PROCALCITON, LATICACIDVEN in the last 168 hours.  Recent Results (from the past 240 hour(s))  Resp Panel by RT-PCR (Flu A&B, Covid) Nasopharyngeal Swab     Status: None   Collection Time: 11/29/20  9:19 AM   Specimen: Nasopharyngeal Swab; Nasopharyngeal(NP) swabs in vial transport medium  Result Value Ref Range Status   SARS Coronavirus 2 by RT PCR NEGATIVE NEGATIVE Final    Comment: (NOTE) SARS-CoV-2 target nucleic acids are NOT DETECTED.  The SARS-CoV-2 RNA is generally detectable in upper respiratory specimens during the acute phase of infection. The lowest concentration of SARS-CoV-2 viral copies this assay can detect is 138 copies/mL. A negative result does not preclude SARS-Cov-2 infection and should not be used as the sole basis for treatment or other patient management decisions. A negative result may occur with  improper specimen collection/handling, submission of specimen other than nasopharyngeal swab, presence of viral mutation(s) within the areas targeted by this assay, and inadequate number of viral copies(<138 copies/mL). A negative result must be combined with clinical observations, patient history, and epidemiological information. The expected result is Negative.  Fact Sheet for Patients:  EntrepreneurPulse.com.au  Fact Sheet for Healthcare Providers:  IncredibleEmployment.be  This test is no t yet approved or cleared by the [place] FDA and  has been authorized for detection and/or diagnosis of SARS-CoV-2 by FDA under an  Emergency Use Authorization (EUA). This EUA will remain  in effect (meaning this test can be used) for the duration of the COVID-19 declaration under Section 564(b)(1) of the Act, 21 U.S.C.section 360bbb-3(b)(1), unless the authorization is terminated  or revoked sooner.       Influenza A by PCR NEGATIVE NEGATIVE Final   Influenza B by PCR NEGATIVE NEGATIVE Final    Comment: (NOTE) The Xpert Xpress SARS-CoV-2/FLU/RSV plus assay is intended as an aid in the diagnosis of influenza from Nasopharyngeal swab specimens and should not be used as a sole basis for treatment. Nasal washings and aspirates are unacceptable for Xpert Xpress SARS-CoV-2/FLU/RSV testing.  Fact Sheet for Patients: EntrepreneurPulse.com.au  Fact Sheet for Healthcare Providers: IncredibleEmployment.be  This test is not yet approved or cleared by the [place] FDA and has been authorized for detection and/or diagnosis of SARS-CoV-2 by FDA under an Emergency Use Authorization (EUA). This EUA will remain in effect (meaning this test can be used) for the duration of  the COVID-19 declaration under Section 564(b)(1) of the Act, 21 U.S.C. section 360bbb-3(b)(1), unless the authorization is terminated or revoked.  Performed at [facility], [address], [place], [place] [address]          Radiology Studies: CT Head Wo Contrast  Result Date: 11/29/2020 CLINICAL DATA:  Neck trauma (Age >= 65y); Head trauma, minor (Age >= 65y) EXAM: CT HEAD WITHOUT CONTRAST CT CERVICAL SPINE WITHOUT CONTRAST TECHNIQUE: Multidetector CT imaging of the head and cervical spine was performed following the standard protocol without intravenous contrast. Multiplanar CT image reconstructions of the cervical spine were also generated. COMPARISON:  12/30/2019. FINDINGS: CT HEAD FINDINGS Brain: In comparison to 12/30/2019, no substantial change in the size of a 12 mm mixed density left subdural  collection. There is a small amount of hyperdensity within the collection which could represent recent hemorrhage. No significant mass effect or midline shift. No clearly new hemorrhage. No evidence of acute large vascular territory infarct. Similar patchy white matter hypoattenuation, most likely related to chronic microvascular ischemic disease. No hydrocephalus. No mass lesion. Vascular: No hyperdense vessel identified. Calcific intracranial atherosclerosis. Skull: No acute fracture. Sinuses/Orbits: Polyp or retention cyst in the inferior left maxillary sinus. Mild ethmoid air cell mucosal thickening. Otherwise, sinuses are clear. Unremarkable orbits. Other: No mastoid effusions. CT CERVICAL SPINE FINDINGS Alignment: Similar alignment. Similar mild reversal the cervical lordosis. Similar slight widening of the C2-C3 disc space anteriorly. No substantial sagittal subluxation. Mild broad dextrocurvature. Skull base and vertebrae: Similar appearance of extensive ankylosis throughout the cervical spine from C2 inferiorly. No evidence of acute fracture. Vertebral body heights are similar to prior. Diffuse osteopenia. Soft tissues and spinal canal: No prevertebral fluid or swelling. No visible canal hematoma. Disc levels: Similar partially calcified central disc protrusion at C2-C3. Multilevel facet and uncovertebral hypertrophy. Multi level posterior osteophytic spurring which narrows the canal without evidence of high-grade bony canal stenosis. Upper chest: Biapical pleuroparenchymal scarring. Otherwise, visualized lung apices are clear. IMPRESSION: CT Head: 1. In comparison to 12/30/2019, no substantial change in size of a 12 mm mixed density left subdural collection. There is a small amount of hyperdensity within the collection, which may represent recent hemorrhage. No significant mass effect or midline shift. 2. Otherwise, no new acute abnormality. CT Cervical Spine: 1. No evidence of acute fracture or traumatic  malalignment. 2. Similar appearance of extensive ankylosis throughout the cervical spine from C2 inferiorly. 3. Diffuse osteopenia. Electronically Signed   By: Margaretha Sheffield MD   On: 11/29/2020 11:40   CT Cervical Spine Wo Contrast  Result Date: 11/29/2020 CLINICAL DATA:  Neck trauma (Age >= 65y); Head trauma, minor (Age >= 65y) EXAM: CT HEAD WITHOUT CONTRAST CT CERVICAL SPINE WITHOUT CONTRAST TECHNIQUE: Multidetector CT imaging of the head and cervical spine was performed following the standard protocol without intravenous contrast. Multiplanar CT image reconstructions of the cervical spine were also generated. COMPARISON:  12/30/2019. FINDINGS: CT HEAD FINDINGS Brain: In comparison to 12/30/2019, no substantial change in the size of a 12 mm mixed density left subdural collection. There is a small amount of hyperdensity within the collection which could represent recent hemorrhage. No significant mass effect or midline shift. No clearly new hemorrhage. No evidence of acute large vascular territory infarct. Similar patchy white matter hypoattenuation, most likely related to chronic microvascular ischemic disease. No hydrocephalus. No mass lesion. Vascular: No hyperdense vessel identified. Calcific intracranial atherosclerosis. Skull: No acute fracture. Sinuses/Orbits: Polyp or retention cyst in the inferior left maxillary sinus.  Mild ethmoid air cell mucosal thickening. Otherwise, sinuses are clear. Unremarkable orbits. Other: No mastoid effusions. CT CERVICAL SPINE FINDINGS Alignment: Similar alignment. Similar mild reversal the cervical lordosis. Similar slight widening of the C2-C3 disc space anteriorly. No substantial sagittal subluxation. Mild broad dextrocurvature. Skull base and vertebrae: Similar appearance of extensive ankylosis throughout the cervical spine from C2 inferiorly. No evidence of acute fracture. Vertebral body heights are similar to prior. Diffuse osteopenia. Soft tissues and spinal  canal: No prevertebral fluid or swelling. No visible canal hematoma. Disc levels: Similar partially calcified central disc protrusion at C2-C3. Multilevel facet and uncovertebral hypertrophy. Multi level posterior osteophytic spurring which narrows the canal without evidence of high-grade bony canal stenosis. Upper chest: Biapical pleuroparenchymal scarring. Otherwise, visualized lung apices are clear. IMPRESSION: CT Head: 1. In comparison to 12/30/2019, no substantial change in size of a 12 mm mixed density left subdural collection. There is a small amount of hyperdensity within the collection, which may represent recent hemorrhage. No significant mass effect or midline shift. 2. Otherwise, no new acute abnormality. CT Cervical Spine: 1. No evidence of acute fracture or traumatic malalignment. 2. Similar appearance of extensive ankylosis throughout the cervical spine from C2 inferiorly. 3. Diffuse osteopenia. Electronically Signed   By: Margaretha Sheffield MD   On: 11/29/2020 11:40   DG Pelvis Portable  Result Date: 11/29/2020 CLINICAL DATA:  Postop right hip hemiarthroplasty. EXAM: PORTABLE PELVIS 1-2 VIEWS COMPARISON:  Preoperative radiograph earlier today. FINDINGS: Right hip hemiarthroplasty in expected alignment. No periprosthetic lucency. Recent postsurgical change includes air and edema in the soft tissues. Lateral skin staples in place. IMPRESSION: Right hip hemiarthroplasty without immediate postoperative complication. Electronically Signed   By: Keith Rake M.D.   On: 11/29/2020 17:09   DG Pelvis Portable  Result Date: 11/29/2020 CLINICAL DATA:  Right hip replacement. EXAM: PORTABLE PELVIS 1-2 VIEWS COMPARISON:  Same day. FINDINGS: Two intraoperative images were obtained of the right hip. The femoral prosthesis is noted. Expected postoperative changes are seen in the surrounding soft tissues. IMPRESSION: Femoral prosthesis is noted. Electronically Signed   By: Marijo Conception M.D.   On:  11/29/2020 15:47   DG Pelvis Portable  Result Date: 11/29/2020 CLINICAL DATA:  Closed right hip fracture EXAM: PORTABLE PELVIS 1-2 VIEWS COMPARISON:  None. FINDINGS: There is a right femoral neck fracture with varus angulation. No subluxation or dislocation. Hip joints are symmetric. IMPRESSION: Right femoral neck fracture with varus angulation. Electronically Signed   By: Rolm Baptise M.D.   On: 11/29/2020 10:17   DG Chest Port 1 View  Result Date: 11/29/2020 CLINICAL DATA:  87 year old male status post fall at home. Right femoral neck fracture. EXAM: PORTABLE CHEST 1 VIEW COMPARISON:  None. FINDINGS: Portable AP view at 0845 hours. Low lung volumes. Mild cardiomegaly. Other mediastinal contours are within normal limits. Visualized tracheal air column is within normal limits. Allowing for portable technique the lungs are clear. No pneumothorax or pleural effusion is evident. No acute osseous abnormality identified. IMPRESSION: Low lung volumes.  No acute cardiopulmonary abnormality. Electronically Signed   By: Genevie Ann M.D.   On: 11/29/2020 09:15   DG Hip Unilat  With Pelvis 2-3 Views Right  Result Date: 11/29/2020 CLINICAL DATA:  Fall. EXAM: DG HIP (WITH OR WITHOUT PELVIS) 2-3V RIGHT COMPARISON:  No prior. FINDINGS: Diffuse severe osteopenia and degenerative change. Comminuted right femoral neck fracture with angulation deformity noted. No evidence of dislocation. Pelvic calcifications consistent phleboliths. Dystrophic calcifications noted over the pelvis. IMPRESSION:  1. Comminuted right femoral neck fracture with angulation deformity. 2.  Severe diffuse osteopenia.  Diffuse degenerative change. Electronically Signed   By: Marcello Moores  Register   On: 11/29/2020 09:02        Scheduled Meds:  acetaminophen  1,000 mg Oral Q8H   ALPRAZolam  0.5 mg Oral Daily   amLODipine  5 mg Oral Daily   docusate sodium  100 mg Oral BID   enoxaparin (LOVENOX) injection  30 mg Subcutaneous Q24H   multivitamin with  minerals  1 tablet Oral Daily   Continuous Infusions:  sodium chloride 75 mL/hr at 11/30/20 0421    ceFAZolin (ANCEF) IV Stopped (11/29/20 2311)   methocarbamol (ROBAXIN) IV       LOS: 1 day    Time spent: 35 mins    Wyvonnia Dusky, MD [facility] Pager [phone]  If 7PM-7AM, please contact night-coverage 11/30/2020, 7:27 AM

## 2020-11-30 NOTE — NC FL2 (Signed)
[place] LEVEL OF CARE SCREENING TOOL     IDENTIFICATION  Patient Name: Jacob Moses Birthdate: 11/19/1927 Sex: male Admission Date (Current Location): 11/29/2020  [facility] and [place] Number:  Engineering geologist and Address:  [facility], [address] [place], [place] [address]      Provider Number: [id number]  Attending Physician Name and Address:  Wyvonnia Dusky, MD  Relative Name and Phone Number:  [name] daughter [phone]    Current Level of Care: Hospital Recommended Level of Care: [place] Prior Approval Number:    Date Approved/Denied:   PASRR Number: [id number]  Discharge Plan: SNF    Current Diagnoses: Patient Active Problem List   Diagnosis Date Noted   Closed right hip fracture ([place]) 11/29/2020   Fall at home, initial encounter 11/29/2020   Bradycardia 11/29/2020   HTN (hypertension) 11/29/2020   GERD (gastroesophageal reflux disease) 11/29/2020   Anxiety 11/29/2020   CKD (chronic kidney disease), stage IIIb 11/29/2020   Iron deficiency anemia 11/29/2020   Protein-calorie malnutrition, severe 02/24/2019   SDH (subdural hematoma) ([place]) 02/22/2019    Orientation RESPIRATION BLADDER Height & Weight     Self, Time, Situation, Place  Normal Continent Weight: 62.1 kg Height:  '5\' 11"'$  (180.3 cm)  BEHAVIORAL SYMPTOMS/MOOD NEUROLOGICAL BOWEL NUTRITION STATUS      Continent Diet (regular)  AMBULATORY STATUS COMMUNICATION OF NEEDS Skin   Extensive Assist Verbally Normal, Surgical wounds                       Personal Care Assistance Level of Assistance  Bathing, Dressing, Feeding Bathing Assistance: Limited assistance Feeding assistance: Independent Dressing Assistance: Limited assistance     Functional Limitations Info             SPECIAL CARE FACTORS FREQUENCY  PT (By licensed PT), OT (By licensed OT)     PT Frequency: 5 times per week OT Frequency: 5 times per week             Contractures Contractures Info: Not present    Additional Factors Info  Code Status, Allergies Code Status Info: DNR Allergies Info: NKDA           Current Medications (11/30/2020):  This is the current hospital active medication list Current Facility-Administered Medications  Medication Dose Route Frequency Provider Last Rate Last Admin   0.9 %  sodium chloride infusion   Intravenous Continuous Leim Fabry, MD 75 mL/hr at 11/30/20 0421 Infusion Verify at 11/30/20 0421   acetaminophen (TYLENOL) tablet 1,000 mg  1,000 mg Oral Q8H Leim Fabry, MD   1,000 mg at 11/30/20 0419   ALPRAZolam (XANAX XR) 24 hr tablet 0.5 mg  0.5 mg Oral Daily Leim Fabry, MD       amLODipine (NORVASC) tablet 5 mg  5 mg Oral Daily Leim Fabry, MD   5 mg at 11/30/20 [id number]   bisacodyl (DULCOLAX) suppository 10 mg  10 mg Rectal Daily PRN Leim Fabry, MD       docusate sodium (COLACE) capsule 100 mg  100 mg Oral BID Leim Fabry, MD   100 mg at 11/30/20 0832   enoxaparin (LOVENOX) injection 30 mg  30 mg Subcutaneous Q24H Renda Rolls, RPH   30 mg at 11/30/20 [id number]   HYDROmorphone (DILAUDID) injection 0.2-0.4 mg  0.2-0.4 mg Intravenous Q4H PRN Leim Fabry, MD       menthol-cetylpyridinium (CEPACOL) lozenge 3 mg  1 lozenge Oral PRN  Leim Fabry, MD       Or   phenol [facility]) mouth spray 1 spray  1 spray Mouth/Throat PRN Leim Fabry, MD       methocarbamol (ROBAXIN) tablet 500 mg  500 mg Oral Q6H PRN Leim Fabry, MD   500 mg at 11/29/20 2238   Or   methocarbamol (ROBAXIN) 500 mg in dextrose 5 % 50 mL IVPB  500 mg Intravenous Q6H PRN Leim Fabry, MD       metoCLOPramide (REGLAN) tablet 5-10 mg  5-10 mg Oral Q8H PRN Leim Fabry, MD       Or   metoCLOPramide (REGLAN) injection 5-10 mg  5-10 mg Intravenous Q8H PRN Leim Fabry, MD       multivitamin with minerals tablet 1 tablet  1 tablet Oral Daily Leim Fabry, MD   1 tablet at 11/30/20 0832   ondansetron [facility]) tablet 4 mg  4 mg Oral  Q6H PRN Leim Fabry, MD       Or   ondansetron [facility]) injection 4 mg  4 mg Intravenous Q6H PRN Leim Fabry, MD       oxyCODONE (Oxy IR/ROXICODONE) immediate release tablet 2.5-5 mg  2.5-5 mg Oral Q4H PRN Leim Fabry, MD   5 mg at 11/30/20 [id number]   oxyCODONE (Oxy IR/ROXICODONE) immediate release tablet 5-10 mg  5-10 mg Oral Q4H PRN Leim Fabry, MD   5 mg at 11/30/20 1009   senna-docusate (Senokot-S) tablet 1 tablet  1 tablet Oral QHS PRN Leim Fabry, MD       sodium phosphate (FLEET) 7-19 GM/118ML enema 1 enema  1 enema Rectal Once PRN Leim Fabry, MD       sodium zirconium cyclosilicate (LOKELMA) packet 10 g  10 g Oral Once Wyvonnia Dusky, MD       traMADol Veatrice [place]) tablet 50 mg  50 mg Oral Q6H PRN Leim Fabry, MD       traZODone (DESYREL) tablet 25 mg  25 mg Oral QHS PRN Leim Fabry, MD   25 mg at 11/29/20 2243     Discharge Medications: Please see discharge summary for a list of discharge medications.  Relevant Imaging Results:  Relevant Lab Results:   Additional Information SS#: [id number]  Su Hilt, RN

## 2020-11-30 NOTE — Progress Notes (Signed)
Physical Therapy Treatment Patient Details Name: Jacob Moses MRN: [id number] DOB: May 31, 1927 Today's Date: 11/30/2020    History of Present Illness Jacob Moses is a 87 y.o. male with medical history significant of HTN, anxiety, HOH, gastric ulcer, vertigo, anemia, insomnia, who presents with fall and right hip pain.  S/P right hip hemiarthroplasty.    PT Comments    Patient had just returned to bed with NT assist. He is agreeable to bed exercises. Patient performed all exercises with min assist. Pain improved with movement of R LE. He and daughter educated on posterior hip precautions and abduction pillow placed between legs in bed. Patient will continue to benefit from skilled PT while here to improve functional mobility, independence, strength and safety.      Follow Up Recommendations  SNF;Supervision/Assistance - 24 hour     Equipment Recommendations  Rolling walker with 5" wheels    Recommendations for Other Services       Precautions / Restrictions Precautions Precautions: Fall;Posterior Hip Precaution Booklet Issued: No Precaution Comments: Abduction pillow- needs post hip precaution handout Restrictions Weight Bearing Restrictions: Yes RLE Weight Bearing: Weight bearing as tolerated    Mobility  Bed Mobility               General bed mobility comments: patient had just gotten back to bed with NT this pm    Transfers                    Ambulation/Gait                 Stairs             Wheelchair Mobility    Modified Rankin (Stroke Patients Only)       Balance                                            Cognition Arousal/Alertness: Awake/alert Behavior During Therapy: WFL for tasks assessed/performed Overall Cognitive Status: Within Functional Limits for tasks assessed                                        Exercises Total Joint Exercises Ankle Circles/Pumps: AROM;Both;10  reps Heel Slides: AAROM;Right;10 reps Hip ABduction/ADduction: AAROM;Right;10 reps Straight Leg Raises: AAROM;Right;10 reps    General Comments        Pertinent Vitals/Pain Pain Assessment: Faces Faces Pain Scale: Hurts little more Pain Location: R LE with movement Pain Descriptors / Indicators: Aching;Discomfort;Sore;Grimacing;Guarding Pain Intervention(s): Monitored during session;Limited activity within patient's tolerance;Repositioned    Home Living                      Prior Function            PT Goals (current goals can now be found in the care plan section) Acute Rehab PT Goals Patient Stated Goal: patient wants to go home PT Goal Formulation: With patient Time For Goal Achievement: 12/14/20 Potential to Achieve Goals: Fair Progress towards PT goals: Progressing toward goals    Frequency    BID      PT Plan Current plan remains appropriate    Co-evaluation              AM-PAC PT "6 Clicks" Mobility   Outcome  Measure  Help needed turning from your back to your side while in a flat bed without using bedrails?: A Lot Help needed moving from lying on your back to sitting on the side of a flat bed without using bedrails?: A Lot Help needed moving to and from a bed to a chair (including a wheelchair)?: A Lot Help needed standing up from a chair using your arms (e.g., wheelchair or bedside chair)?: A Lot Help needed to walk in hospital room?: A Lot Help needed climbing 3-5 steps with a railing? : Total 6 Click Score: 11    End of Session Equipment Utilized During Treatment: Gait belt Activity Tolerance: Patient limited by pain;Patient limited by fatigue Patient left: in bed;with call bell/phone within reach;with family/visitor present Nurse Communication: Mobility status PT Visit Diagnosis: Unsteadiness on feet (R26.81);History of falling (Z91.81);Muscle weakness (generalized) (M62.81);Other abnormalities of gait and mobility  (R26.89);Difficulty in walking, not elsewhere classified (R26.2);Pain Pain - Right/Left: Right Pain - part of body: Leg;Hip     Time: 1500-1516 PT Time Calculation (min) (ACUTE ONLY): 16 min  Charges:  $Therapeutic Exercise: 8-22 mins                    [employer], PT, [facility] 11/30/20,3:26 PM

## 2020-11-30 NOTE — Evaluation (Signed)
Occupational Therapy Evaluation Patient Details Name: Jacob Moses MRN: [id number] DOB: January 21, 1928 Today's Date: 11/30/2020    History of Present Illness Jacob Moses is a 87 y.o. male with medical history significant of HTN, anxiety, HOH, gastric ulcer, vertigo, anemia, insomnia, who presents with fall and right hip pain.  S/P right hip hemiarthroplasty.   Clinical Impression   Pt seen for OT evaluation this date in setting of acute hospitalization d/t fall, now s/p R hip hemi. Pt presents this date with decreased fxl activity tolerance and pain limited his ability to safely and efficiently perform his ADLs/ADL mobility. Pt's daughter present throughout session. Pt reports living alone and performing all tasks himself including driving. Pt has 2 story home, but is able to live on main level with only 1 STE. This date, pt requiring MOD A +2 for ADL transfers with RW as well as OT cueing for safe sequencing. OT engages pt in toileting with MOD A for peri care in standing while PT provides assist for pt to sustain standing balance. Pt returned to chair with chair alarm and CNA notified by this author that pt had small BM during session. Will continue to follow. Anticipate that pt will require STR f/u OT services given his currently level of fxl performance relative to his prior level and to generally improve safety and tolerance to resume his lifestyle.     Follow Up Recommendations  SNF    Equipment Recommendations  3 in 1 bedside commode;Tub/shower seat;Other (comment) (2ww)    Recommendations for Other Services       Precautions / Restrictions Precautions Precautions: Fall;Posterior Hip Precaution Booklet Issued: No Precaution Comments: Abduction pillow- needs post hip precaution handout Restrictions Weight Bearing Restrictions: Yes RLE Weight Bearing: Weight bearing as tolerated      Mobility Bed Mobility Overal bed mobility: Needs Assistance Bed Mobility: Supine to Sit      Supine to sit: Mod assist;+2 for physical assistance     General bed mobility comments: patient had just gotten back to bed with NT this pm    Transfers Overall transfer level: Needs assistance Equipment used: Rolling walker (2 wheeled) Transfers: Sit to/from [facility] Sit to Stand: Mod assist;+2 physical assistance;+2 safety/equipment;From elevated surface Stand pivot transfers: Mod assist;+2 physical assistance;+2 safety/equipment       General transfer comment: requires extended time, cues for hand placement/sequence of 2WW use    Balance Overall balance assessment: Needs assistance Sitting-balance support: Feet supported Sitting balance-Leahy Scale: Good     Standing balance support: Bilateral upper extremity supported;During functional activity Standing balance-Leahy Scale: Fair Standing balance comment: patient reliant on B UE support and mod assist                           ADL either performed or assessed with clinical judgement   ADL                                         General ADL Comments: requires SETUP for seated UB ADLs, MOD/MAX A For seated LB ADLs, MOD A for standing LB ADLs including peri care. Pt requires MOD A +2 for transfers with RW. MOD A +2 for bed mobility. Cues for safety.     Vision Patient Visual Report: No change from baseline       Perception  Praxis      Pertinent Vitals/Pain Pain Assessment: Faces Faces Pain Scale: Hurts whole lot Pain Location: R LE with movement Pain Descriptors / Indicators: Aching;Discomfort;Sore;Grimacing;Guarding Pain Intervention(s): Limited activity within patient's tolerance;Monitored during session;Repositioned;Premedicated before session     Hand Dominance Right   Extremity/Trunk Assessment Upper Extremity Assessment Upper Extremity Assessment: Overall WFL for tasks assessed;Generalized weakness (ROM WFL, MMT grossly 4-/5)   Lower Extremity  Assessment Lower Extremity Assessment: Defer to PT evaluation;RLE deficits/detail RLE Deficits / Details: some (expected) generally decreased tolerance for RLE ROM RLE: Unable to fully assess due to pain       Communication Communication Communication: HOH   Cognition Arousal/Alertness: Awake/alert Behavior During Therapy: WFL for tasks assessed/performed Overall Cognitive Status: Within Functional Limits for tasks assessed                                 General Comments: Pt follows all commands, generally oriented, some delayed processing of cues, but could be r/t hearing   General Comments       Exercises Total Joint Exercises Ankle Circles/Pumps: AROM;Both;10 reps Heel Slides: AAROM;Right;10 reps Hip ABduction/ADduction: AAROM;Right;10 reps Straight Leg Raises: AAROM;Right;10 reps Other Exercises Other Exercises: OT ed re: role of OT in acute setting with pt and dtr.   Shoulder Instructions      Home Living Family/patient expects to be discharged to:: Skilled nursing facility Living Arrangements: Alone Available Help at Discharge: Family;Available PRN/intermittently Type of Home: House Home Access: Stairs to enter Entrance Stairs-Number of Steps: 1   Home Layout: Two level;Able to live on main level with bedroom/bathroom               Home Equipment: Walker - 2 wheels          Prior Functioning/Environment Level of Independence: Independent        Comments: walks at the mall, drives. Dtr does endorse h/o some falls, was adm 2YA for fall        OT Problem List: Decreased strength;Decreased activity tolerance;Impaired balance (sitting and/or standing);Decreased knowledge of use of DME or AE;Pain      OT Treatment/Interventions: Self-care/ADL training;Therapeutic activities;Therapeutic exercise;Patient/family education;DME and/or AE instruction    OT Goals(Current goals can be found in the care plan section) Acute Rehab OT  Goals Patient Stated Goal: patient wants to go home OT Goal Formulation: With patient Time For Goal Achievement: 12/14/20 Potential to Achieve Goals: Good ADL Goals Pt Will Perform Lower Body Dressing: with min assist;sit to/from stand (with LRAD/AE PRN) Pt Will Transfer to Toilet: with min assist;bedside commode (with LRAD to complete ~10' to [facility] to increase tolerance for fxl HH distances.) Pt Will Perform Toileting - Clothing Manipulation and hygiene: with min assist  OT Frequency: Min 1X/week   Barriers to D/C: Decreased caregiver support  lives alone       Co-evaluation PT/OT/SLP Co-Evaluation/Treatment: Yes Reason for Co-Treatment: To address functional/ADL transfers;Necessary to address cognition/behavior during functional activity;For patient/therapist safety PT goals addressed during session: Mobility/safety with mobility;Balance OT goals addressed during session: ADL's and self-care;Proper use of Adaptive equipment and DME      AM-PAC OT "6 Clicks" Daily Activity     Outcome Measure Help from another person eating meals?: None Help from another person taking care of personal grooming?: A Little Help from another person toileting, which includes using toliet, bedpan, or urinal?: A Lot Help from another person bathing (including washing, rinsing, drying)?: A  Lot Help from another person to put on and taking off regular upper body clothing?: A Little Help from another person to put on and taking off regular lower body clothing?: A Lot 6 Click Score: 16   End of Session Equipment Utilized During Treatment: Gait belt;Rolling walker Nurse Communication: Other (comment) (notified CNA of very small BM during session)  Activity Tolerance: Patient tolerated treatment well Patient left: in chair;with call bell/phone within reach;with chair alarm set;Other (comment) (PT finishing up session)  OT Visit Diagnosis: Unsteadiness on feet (R26.81);Muscle weakness (generalized) (M62.81)                 Time: [id number] OT Time Calculation (min): 27 min Charges:  OT General Charges $OT Visit: 1 Visit OT Evaluation $OT Eval Moderate Complexity: [place], [place], OTR/L ascom [phone] 11/30/20, 4:54 PM

## 2020-11-30 NOTE — Progress Notes (Signed)
PHARMACIST - PHYSICIAN COMMUNICATION  CONCERNING:  Enoxaparin (Lovenox) for DVT Prophylaxis    RECOMMENDATION: Patient was prescribed enoxaprin '40mg'$  q24 hours for VTE prophylaxis.   Filed Weights   11/29/20 0834  Weight: 62.1 kg (137 lb)    Body mass index is 19.11 kg/m.  Estimated Creatinine Clearance: 24.9 mL/min (A) (by C-G formula based on SCr of 1.7 mg/dL (H)).  Patient is candidate for enoxaparin '30mg'$  every 24 hours based on CrCl <38m/min or Weight <45kg  DESCRIPTION: [facility]  Patient is now receiving enoxaparin 30 mg every 24 hours    NRenda Rolls PharmD, [facility]7/29/2022 2:23 AM

## 2020-11-30 NOTE — Progress Notes (Signed)
  Subjective: 1 Day Post-Op Procedure(s) (LRB): ARTHROPLASTY BIPOLAR HIP (HEMIARTHROPLASTY)-Extra Scrub (Right) Patient reports pain as moderate.   Patient is well, and has had no acute complaints or problems Plan is to go Home versus rehab after hospital stay. Negative for chest pain and shortness of breath Fever: no Gastrointestinal: Negative for nausea and vomiting  Objective: Vital signs in last 24 hours: Temp:  [96.8 F (36 C)-98.7 F (37.1 C)] 98.4 F (36.9 C) (07/29 0524) Pulse Rate:  [39-76] 61 (07/29 0524) Resp:  [12-20] 16 (07/29 0524) BP: (138-187)/(66-89) 138/75 (07/29 0524) SpO2:  [95 %-100 %] 97 % (07/29 0524) Weight:  [62.1 kg] 62.1 kg (07/28 0834)  Intake/Output from previous day:  Intake/Output Summary (Last 24 hours) at 11/30/2020 0704 Last data filed at 11/30/2020 0421 Gross per 24 hour  Intake 1639.37 ml  Output 1450 ml  Net 189.37 ml    Intake/Output this shift: No intake/output data recorded.  Labs: Recent Labs    11/29/20 0837 11/30/20 0613  HGB 11.7* 10.6*   Recent Labs    11/29/20 0837 11/30/20 0613  WBC 8.2 19.6*  RBC 3.59* 3.21*  HCT 35.9* 31.7*  PLT 269 268   Recent Labs    11/29/20 0837 11/30/20 0613  NA 136 134*  K 4.7 5.6*  CL 102 103  CO2 22 25  BUN 31* 29*  CREATININE 1.70* 1.52*  GLUCOSE 115* 158*  CALCIUM 9.0 8.6*   Recent Labs    11/29/20 0837  INR 1.1     EXAM General - Patient is Alert and Oriented Extremity - Neurovascular intact Sensation intact distally Dorsiflexion/Plantar flexion intact Compartment soft Dressing/Incision - clean, dry, scant drainage that is blood-tinged Motor Function - intact, moving foot and toes well on exam.   Past Medical History:  Diagnosis Date   H/O vertigo    History of stomach ulcers    HOH (hard of hearing)    Hypertension    Insomnia    Wears dentures     Assessment/Plan: 1 Day Post-Op Procedure(s) (LRB): ARTHROPLASTY BIPOLAR HIP (HEMIARTHROPLASTY)-Extra  Scrub (Right) Principal Problem:   Closed right hip fracture (HCC) Active Problems:   Fall at home, initial encounter   Bradycardia   HTN (hypertension)   Anxiety   CKD (chronic kidney disease), stage IIIb   Iron deficiency anemia  Estimated body mass index is 19.11 kg/m as calculated from the following:   Height as of this encounter: '5\' 11"'$  (1.803 m).   Weight as of this encounter: 62.1 kg. Advance diet Up with therapy D/C IV fluids  Discharge planning with care management. Follow-up at [facility] orthopedics in 2 weeks for staple removal and x-rays of the right hip  DVT Prophylaxis - Lovenox, Foot Pumps, and TED hose Weight-Bearing as tolerated to right leg  Reche Dixon, PA-C Orthopaedic Surgery 11/30/2020, 7:04 AM

## 2020-12-01 LAB — CBC
HCT: 30.8 % — ABNORMAL LOW (ref 39.0–52.0)
Hemoglobin: 10.2 g/dL — ABNORMAL LOW (ref 13.0–17.0)
MCH: 32.8 pg (ref 26.0–34.0)
MCHC: 33.1 g/dL (ref 30.0–36.0)
MCV: 99 fL (ref 80.0–100.0)
Platelets: 238 10*3/uL (ref 150–400)
RBC: 3.11 MIL/uL — ABNORMAL LOW (ref 4.22–5.81)
RDW: 12.3 % (ref 11.5–15.5)
WBC: 14.9 10*3/uL — ABNORMAL HIGH (ref 4.0–10.5)
nRBC: 0 % (ref 0.0–0.2)

## 2020-12-01 LAB — BASIC METABOLIC PANEL
Anion gap: 4 — ABNORMAL LOW (ref 5–15)
BUN: 28 mg/dL — ABNORMAL HIGH (ref 8–23)
CO2: 29 mmol/L (ref 22–32)
Calcium: 8.5 mg/dL — ABNORMAL LOW (ref 8.9–10.3)
Chloride: 100 mmol/L (ref 98–111)
Creatinine, Ser: 1.62 mg/dL — ABNORMAL HIGH (ref 0.61–1.24)
GFR, Estimated: 40 mL/min — ABNORMAL LOW (ref >60)
Glucose, Bld: 104 mg/dL — ABNORMAL HIGH (ref 70–99)
Potassium: 5.2 mmol/L — ABNORMAL HIGH (ref 3.5–5.1)
Sodium: 133 mmol/L — ABNORMAL LOW (ref 135–145)

## 2020-12-01 LAB — POTASSIUM: Potassium: 4.5 mmol/L (ref 3.5–5.1)

## 2020-12-01 MED ORDER — ACETAMINOPHEN 325 MG PO TABS
650.0000 mg | ORAL_TABLET | Freq: Four times a day (QID) | ORAL | Status: DC | PRN
  Administered 2020-12-02: 650 mg via ORAL
  Filled 2020-12-01: qty 2

## 2020-12-01 MED ORDER — TRAZODONE HCL 50 MG PO TABS
25.0000 mg | ORAL_TABLET | Freq: Every day | ORAL | Status: DC
  Administered 2020-12-01 – 2020-12-02 (×2): 25 mg via ORAL
  Filled 2020-12-01 (×2): qty 1

## 2020-12-01 MED ORDER — ALPRAZOLAM ER 0.5 MG PO TB24: 0.5000 mg | ORAL_TABLET | Freq: Every day | ORAL | Status: DC

## 2020-12-01 MED ORDER — SODIUM ZIRCONIUM CYCLOSILICATE 10 G PO PACK
10.0000 g | PACK | Freq: Once | ORAL | Status: AC
  Administered 2020-12-01: 10 g via ORAL
  Filled 2020-12-01: qty 1

## 2020-12-01 MED ORDER — ALPRAZOLAM ER 0.5 MG PO TB24
0.5000 mg | ORAL_TABLET | Freq: Every day | ORAL | Status: DC
  Administered 2020-12-01 – 2020-12-02 (×2): 0.5 mg via ORAL
  Filled 2020-12-01 (×4): qty 1

## 2020-12-01 NOTE — Progress Notes (Signed)
Physical Therapy Treatment Patient Details Name: Jacob Moses MRN: [id number] DOB: [birth year] Today's Date: 12/01/2020    History of Present Illness Jacob Moses is a 87 y.o. male with medical history significant of HTN, anxiety, HOH, gastric ulcer, vertigo, anemia, insomnia, who presents with fall and right hip pain.  S/P right hip hemiarthroplasty.    PT Comments    Pt with slightly elevated K+ & MD cleared pt for participation. Pt received in recliner, reporting he's feeling better compared to this morning. PT requires max cuing to recall posterior hip precautions with pt only recalling 1-2/3 at end of session. Pt requires MAX assist for sit>stand & to attempt to take 1 step forward + backward with very impaired gait pattern as noted below. Pt then reports he has to sit & does so despite being another step away from the chair with PT assisting pt to lower to recliner. Educated pt on need to attempt gait to progress with mobility. Will f/u during PM session.     Follow Up Recommendations  SNF;Supervision/Assistance - 24 hour     Equipment Recommendations  Rolling walker with 5" wheels    Recommendations for Other Services       Precautions / Restrictions Precautions Precautions: Fall;Posterior Hip Restrictions Weight Bearing Restrictions: Yes RLE Weight Bearing: Weight bearing as tolerated    Mobility  Bed Mobility               General bed mobility comments: not observed, pt received & left in recliner    Transfers Overall transfer level: Needs assistance Equipment used: Rolling walker (2 wheeled) Transfers: Sit to/from Stand   Stand pivot transfers: Max assist       General transfer comment: max cuing for hand placement, cuing to scoot out to edge of seat, BLE placement, assistance to power up to standing & facilitate anterior shift when rising to stand  Ambulation/Gait Ambulation/Gait assistance: Mod assist Gait Distance (Feet): 1 Feet (1 step forward  + back) Assistive device: Rolling walker (2 wheeled) Gait Pattern/deviations: Decreased step length - right;Decreased step length - left;Decreased dorsiflexion - right;Decreased dorsiflexion - left;Decreased stride length;Decreased weight shift to right;Decreased weight shift to left;Trunk flexed Gait velocity: decreased   General Gait Details: decreased foot clearance BLE   Stairs             Wheelchair Mobility    Modified Rankin (Stroke Patients Only)       Balance Overall balance assessment: Needs assistance         Standing balance support: Bilateral upper extremity supported;During functional activity Standing balance-Leahy Scale: Poor Standing balance comment: patient reliant on B UE support and mod assist                            Cognition Arousal/Alertness: Awake/alert Behavior During Therapy: WFL for tasks assessed/performed Overall Cognitive Status: Within Functional Limits for tasks assessed                                        Exercises Total Joint Exercises Quad Sets: AROM;Strengthening;Right;10 reps Long Arc Quad: AROM;Strengthening;Right;20 reps;Seated    General Comments        Pertinent Vitals/Pain Pain Assessment: Faces Faces Pain Scale: Hurts little more Pain Location: R LE with movement Pain Descriptors / Indicators: Grimacing;Sore;Guarding Pain Intervention(s): Limited activity within patient's tolerance;Monitored during session  Home Living                      Prior Function            PT Goals (current goals can now be found in the care plan section) Acute Rehab PT Goals Patient Stated Goal: patient wants to go home PT Goal Formulation: With patient Time For Goal Achievement: 12/14/20 Potential to Achieve Goals: Fair Progress towards PT goals: Progressing toward goals    Frequency    BID      PT Plan Current plan remains appropriate    Co-evaluation               AM-PAC PT "6 Clicks" Mobility   Outcome Measure  Help needed turning from your back to your side while in a flat bed without using bedrails?: A Lot Help needed moving from lying on your back to sitting on the side of a flat bed without using bedrails?: A Lot Help needed moving to and from a bed to a chair (including a wheelchair)?: Total Help needed standing up from a chair using your arms (e.g., wheelchair or bedside chair)?: Total Help needed to walk in hospital room?: Total Help needed climbing 3-5 steps with a railing? : Total 6 Click Score: 8    End of Session Equipment Utilized During Treatment: Gait belt Activity Tolerance: Patient limited by pain;Patient limited by fatigue Patient left: in chair;with chair alarm set;with call bell/phone within reach;with SCD's reapplied (hip abduction wedge in place) Nurse Communication: Mobility status PT Visit Diagnosis: Unsteadiness on feet (R26.81);History of falling (Z91.81);Muscle weakness (generalized) (M62.81);Other abnormalities of gait and mobility (R26.89);Difficulty in walking, not elsewhere classified (R26.2);Pain Pain - Right/Left: Right Pain - part of body: Leg;Hip     Time: [id number] PT Time Calculation (min) (ACUTE ONLY): 16 min  Charges:  $Therapeutic Activity: 8-22 mins                     Jacob Moses, PT, DPT 12/01/20, 10:08 AM    [name] 12/01/2020, 10:06 AM

## 2020-12-01 NOTE — Progress Notes (Signed)
  Subjective: 2 Days Post-Op Procedure(s) (LRB): ARTHROPLASTY BIPOLAR HIP (HEMIARTHROPLASTY)-Extra Scrub (Right) Daughter at bedside. Patient reports pain as moderate.   Patient is well, and has had no acute complaints or problems Plan is to go Skilled nursing facility after hospital stay. Negative for chest pain and shortness of breath Fever: no Gastrointestinal: negative for nausea and vomiting.    Objective: Vital signs in last 24 hours: Temp:  [97.9 F (36.6 C)-98.6 F (37 C)] 97.9 F (36.6 C) (07/30 0802) Pulse Rate:  [60-72] 72 (07/30 0802) Resp:  [16-18] 16 (07/30 0802) BP: (128-143)/(67-74) 128/68 (07/30 0802) SpO2:  [90 %-97 %] 90 % (07/30 0802)  Intake/Output from previous day:  Intake/Output Summary (Last 24 hours) at 12/01/2020 1333 Last data filed at 12/01/2020 1029 Gross per 24 hour  Intake 480 ml  Output --  Net 480 ml    Intake/Output this shift: Total I/O In: 240 [P.O.:240] Out: -   Labs: Recent Labs    11/29/20 0837 11/30/20 0613 12/01/20 0414  HGB 11.7* 10.6* 10.2*   Recent Labs    11/30/20 0613 12/01/20 0414  WBC 19.6* 14.9*  RBC 3.21* 3.11*  HCT 31.7* 30.8*  PLT 268 238   Recent Labs    11/30/20 0613 11/30/20 1635 12/01/20 0414  NA 134*  --  133*  K 5.6* 5.1 5.2*  CL 103  --  100  CO2 25  --  29  BUN 29*  --  28*  CREATININE 1.52*  --  1.62*  GLUCOSE 158*  --  104*  CALCIUM 8.6*  --  8.5*   Recent Labs    11/29/20 0837  INR 1.1     EXAM General - Patient is Alert, Appropriate, and Oriented Extremity - Neurovascular intact Dorsiflexion/Plantar flexion intact Compartment soft Dressing/Incision -clean, dry, no drainage Motor Function - intact, moving foot and toes well on exam.     Assessment/Plan: 2 Days Post-Op Procedure(s) (LRB): ARTHROPLASTY BIPOLAR HIP (HEMIARTHROPLASTY)-Extra Scrub (Right) Principal Problem:   Closed right hip fracture (HCC) Active Problems:   Fall at home, initial encounter    Bradycardia   HTN (hypertension)   Anxiety   CKD (chronic kidney disease), stage IIIb   Iron deficiency anemia  Estimated body mass index is 19.11 kg/m as calculated from the following:   Height as of this encounter: '5\' 11"'$  (1.803 m).   Weight as of this encounter: 62.1 kg. Advance diet Up with therapy  Discharge to SNF pending insurance approval and bed placement      DVT Prophylaxis - Lovenox, Ted hose, and SCDs Weight-Bearing as tolerated to right leg  Cassell Smiles, PA-C [facility] Orthopaedic Surgery 12/01/2020, 1:33 PM

## 2020-12-01 NOTE — Progress Notes (Signed)
Physical Therapy Treatment Patient Details Name: Jacob Moses MRN: [id number] DOB: 13-Jul-1927 Today's Date: 12/01/2020    History of Present Illness Jacob Moses is a 87 y.o. male with medical history significant of HTN, anxiety, HOH, gastric ulcer, vertigo, anemia, insomnia, who presents with fall and right hip pain.  S/P right hip hemiarthroplasty.    PT Comments    Pt seen for PT tx with daughter present for session. Pt is only able to recall 1/3 posterior hip precautions with PT educating him on 3/3. Pt requires MAX assist for sit>stand & stand pivot to bed. Pt appears fearful with activity & requires cuing for safety & technique for transfers, as well as cuing & facilitation for anterior weight shifting as pt leaning posteriorly. Pt notes fatigue but is agreeable to bed level exercises with cuing for technique. Continue to recommend STR upon d/c to maximize independence with functional mobility & reduce fall risk prior to return home.     Follow Up Recommendations  SNF;Supervision/Assistance - 24 hour     Equipment Recommendations  Rolling walker with 5" wheels    Recommendations for Other Services       Precautions / Restrictions Precautions Precautions: Fall;Posterior Hip Restrictions Weight Bearing Restrictions: Yes RLE Weight Bearing: Weight bearing as tolerated    Mobility  Bed Mobility Overal bed mobility: Needs Assistance Bed Mobility: Sit to Supine       Sit to supine: Mod assist;HOB elevated   General bed mobility comments: to elevate BLE onto bed despite PT encouraging pt to attempt to raise LLE pt requests assistance 2/2 weakness    Transfers Overall transfer level: Needs assistance Equipment used: Rolling walker (2 wheeled) Transfers: Sit to/from [employer] Transfers Sit to Stand: Max assist Stand pivot transfers: Max assist       General transfer comment: max cuing for hand placement, cuing to scoot out to edge of seat before  transferring sit>stand, assistance to power up & facilitate anterior weight shifting  Ambulation/Gait                 Stairs             Wheelchair Mobility    Modified Rankin (Stroke Patients Only)       Balance Overall balance assessment: Needs assistance Sitting-balance support: Feet supported Sitting balance-Leahy Scale: Good   Postural control: Posterior lean Standing balance support: Bilateral upper extremity supported;During functional activity Standing balance-Leahy Scale: Poor Standing balance comment: patient reliant on B UE support and max assist, posterior lean requiring multimodal cuing & assistance to facilitate & achieve anterior weight shifting for improved balance                            Cognition Arousal/Alertness: Awake/alert Behavior During Therapy: WFL for tasks assessed/performed Overall Cognitive Status: Within Functional Limits for tasks assessed                                 General Comments: limited by [facility]      Exercises Total Joint Exercises Quad Sets: AROM;Right;Strengthening;10 reps;Supine Short Arc Quad: AAROM;Strengthening;Right;10 reps;Supine Heel Slides: AAROM;Strengthening;Right;10 reps;Supine Hip ABduction/ADduction: AAROM;Right;10 reps;Supine (hip abduction slides) Straight Leg Raises: AAROM;Right;10 reps;Supine    General Comments        Pertinent Vitals/Pain Pain Assessment: Faces Faces Pain Scale: Hurts little more Pain Location: R LE with movement Pain Descriptors / Indicators: Grimacing;Sore;Guarding  Pain Intervention(s): Monitored during session;Repositioned;Premedicated before session    Home Living                      Prior Function            PT Goals (current goals can now be found in the care plan section) Acute Rehab PT Goals Patient Stated Goal: patient wants to go home PT Goal Formulation: With patient Time For Goal Achievement: 12/14/20 Potential to  Achieve Goals: Fair Progress towards PT goals: Progressing toward goals    Frequency    BID      PT Plan Current plan remains appropriate    Co-evaluation              AM-PAC PT "6 Clicks" Mobility   Outcome Measure  Help needed turning from your back to your side while in a flat bed without using bedrails?: A Lot Help needed moving from lying on your back to sitting on the side of a flat bed without using bedrails?: A Lot Help needed moving to and from a bed to a chair (including a wheelchair)?: Total Help needed standing up from a chair using your arms (e.g., wheelchair or bedside chair)?: Total Help needed to walk in hospital room?: Total Help needed climbing 3-5 steps with a railing? : Total 6 Click Score: 8    End of Session Equipment Utilized During Treatment: Gait belt Activity Tolerance: Patient limited by pain;Patient limited by fatigue Patient left: with bed alarm set;with call bell/phone within reach;in bed;with family/visitor present;with SCD's reapplied Nurse Communication: Mobility status PT Visit Diagnosis: Unsteadiness on feet (R26.81);History of falling (Z91.81);Muscle weakness (generalized) (M62.81);Other abnormalities of gait and mobility (R26.89);Difficulty in walking, not elsewhere classified (R26.2);Pain Pain - Right/Left: Right Pain - part of body: Leg;Hip     Time: [id number] PT Time Calculation (min) (ACUTE ONLY): 24 min  Charges:  $Therapeutic Exercise: 8-22 mins $Therapeutic Activity: 8-22 mins                     Lavone Nian, PT, DPT 12/01/20, 2:42 PM    [name] 12/01/2020, 2:41 PM

## 2020-12-01 NOTE — TOC Progression Note (Signed)
Transition of Care [facility]) - Progression Note    Patient Details  Name: Jacob Moses MRN: [id number] Date of Birth: 06-17-1927  Transition of Care [facility]) CM/SW Contact  Zigmund Daniel Dorian Pod, RN Phone Number:[phone] 12/01/2020, 1:53 PM  Clinical Narrative:    Spoke with daughter [name] and pt concerning bed offers. Pt has decided on [facility] for SNF. RN spoke with Magda Paganini at [facility] and informed pt will need the 4th co-vid shot (booster) prior to admission or receive 10 days quarantine with every guest using PPE. Daughter agreed to the 4th shot and provider the contact number to [facility] for details on visitation at the facility.  SNF can admit pt on Monday per Magda Paganini. No other information requested at this time.  Will continue to follow up on pt's discharge planning needs at that time. Will continue to update the team accordingly.        Expected Discharge Plan and Services                                                 Social Determinants of Health (SDOH) Interventions    Readmission Risk Interventions No flowsheet data found.

## 2020-12-01 NOTE — Progress Notes (Signed)
PROGRESS NOTE    Jacob Moses  [id number] DOB: 05-05-1928 DOA: 11/29/2020 PCP: Albina Billet, MD   Assessment & Plan:   Principal Problem:   Closed right hip fracture [facility]) Active Problems:   Fall at home, initial encounter   Bradycardia   HTN (hypertension)   Anxiety   CKD (chronic kidney disease), stage IIIb   Iron deficiency anemia  Closed right hip fracture: secondary to fall at home. S/p right hip hemiarthroplasty. Oxycodone, morphine prn for pain. Ortho surg following and recs apprec. PT/OT recs SNF   Hyperkalemia: lokelema x 1. Repeat potassium level ordered  Leukocytosis: likely reactive, trending down.   Bradycardia: asymptomatic.  No chest pain or shortness of breath.    HTN: continue on amlodipine  Anxiety: severity unknown. Continue on home dose of xanax   CKDIIIb: baseline Cr 1.8-1.9. Cr is labile but still better than baseline    Iron deficiency anemia: not taking iron supplements at home. H&H are stable      DVT prophylaxis: lovenox  Code Status: DNR Family Communication: discussed pt's care w/ pt's daughter and answered her questions Disposition Plan: likely d/c to SNF   Level of care: Med-Surg   Status is: Inpatient  Remains inpatient appropriate because:Unsafe d/c plan, IV treatments appropriate due to intensity of illness or inability to take PO, and Inpatient level of care appropriate due to severity of illness  Dispo: The patient is from: Home              Anticipated d/c is to: SNF              Patient currently is not medically stable to d/c.   Difficult to place patient : unclear    Consultants:  Ortho surg   Procedures:   Antimicrobials:    Subjective: Pt still c/o hip pain  Objective: Vitals:   11/30/20 1136 11/30/20 1528 11/30/20 1959 12/01/20 0343  BP: 125/68 (!) 143/67 (!) 141/74 129/67  Pulse: 66 61 62 60  Resp: '18 16 16 18  '$ Temp: 98 F (36.7 C) 98.2 F (36.8 C) 98.5 F (36.9 C) 98.6 F (37 C)  TempSrc:    Oral Oral  SpO2: 97% 97% 96% 96%  Weight:      Height:        Intake/Output Summary (Last 24 hours) at 12/01/2020 0728 Last data filed at 11/30/2020 1852 Gross per 24 hour  Intake 494.91 ml  Output --  Net 494.91 ml   Filed Weights   11/29/20 0834  Weight: 62.1 kg    Examination:  General exam: Appears comfortable  Respiratory system: clear breath sounds b/l  Cardiovascular system: S1/S2+. No rubs or clicks Gastrointestinal system: Abd is soft, NT, ND & hypoactive bowel sounds Central nervous system: Alert and oriented. Moves all extremities  Psychiatry: judgement and insight appears normal. Flat mood and affect    Data Reviewed: I have personally reviewed following labs and imaging studies  CBC: Recent Labs  Lab 11/29/20 0837 11/30/20 0613 12/01/20 0414  WBC 8.2 19.6* 14.9*  NEUTROABS 5.8  --   --   HGB 11.7* 10.6* 10.2*  HCT 35.9* 31.7* 30.8*  MCV 100.0 98.8 99.0  PLT 269 268 [id number]   Basic Metabolic Panel: Recent Labs  Lab 11/29/20 0837 11/30/20 0613 11/30/20 1635 12/01/20 0414  NA 136 134*  --  133*  K 4.7 5.6* 5.1 5.2*  CL 102 103  --  100  CO2 22 25  --  29  GLUCOSE 115* 158*  --  104*  BUN 31* 29*  --  28*  CREATININE 1.70* 1.52*  --  1.62*  CALCIUM 9.0 8.6*  --  8.5*   GFR: Estimated Creatinine Clearance: 26.1 mL/min (A) (by C-G formula based on SCr of 1.62 mg/dL (H)). Liver Function Tests: No results for input(s): AST, ALT, ALKPHOS, BILITOT, PROT, ALBUMIN in the last 168 hours. No results for input(s): LIPASE, AMYLASE in the last 168 hours. No results for input(s): AMMONIA in the last 168 hours. Coagulation Profile: Recent Labs  Lab 11/29/20 0837  INR 1.1   Cardiac Enzymes: No results for input(s): CKTOTAL, CKMB, CKMBINDEX, TROPONINI in the last 168 hours. BNP (last 3 results) No results for input(s): PROBNP in the last 8760 hours. HbA1C: No results for input(s): HGBA1C in the last 72 hours. CBG: No results for input(s): GLUCAP in  the last 168 hours. Lipid Profile: No results for input(s): CHOL, HDL, LDLCALC, TRIG, CHOLHDL, LDLDIRECT in the last 72 hours. Thyroid Function Tests: No results for input(s): TSH, T4TOTAL, FREET4, T3FREE, THYROIDAB in the last 72 hours. Anemia Panel: No results for input(s): VITAMINB12, FOLATE, FERRITIN, TIBC, IRON, RETICCTPCT in the last 72 hours. Sepsis Labs: No results for input(s): PROCALCITON, LATICACIDVEN in the last 168 hours.  Recent Results (from the past 240 hour(s))  Resp Panel by RT-PCR (Flu A&B, Covid) Nasopharyngeal Swab     Status: None   Collection Time: 11/29/20  9:19 AM   Specimen: Nasopharyngeal Swab; Nasopharyngeal(NP) swabs in vial transport medium  Result Value Ref Range Status   SARS Coronavirus 2 by RT PCR NEGATIVE NEGATIVE Final    Comment: (NOTE) SARS-CoV-2 target nucleic acids are NOT DETECTED.  The SARS-CoV-2 RNA is generally detectable in upper respiratory specimens during the acute phase of infection. The lowest concentration of SARS-CoV-2 viral copies this assay can detect is 138 copies/mL. A negative result does not preclude SARS-Cov-2 infection and should not be used as the sole basis for treatment or other patient management decisions. A negative result may occur with  improper specimen collection/handling, submission of specimen other than nasopharyngeal swab, presence of viral mutation(s) within the areas targeted by this assay, and inadequate number of viral copies(<138 copies/mL). A negative result must be combined with clinical observations, patient history, and epidemiological information. The expected result is Negative.  Fact Sheet for Patients:  EntrepreneurPulse.com.au  Fact Sheet for Healthcare Providers:  IncredibleEmployment.be  This test is no t yet approved or cleared by the [place] FDA and  has been authorized for detection and/or diagnosis of SARS-CoV-2 by FDA under an Emergency Use  Authorization (EUA). This EUA will remain  in effect (meaning this test can be used) for the duration of the COVID-19 declaration under Section 564(b)(1) of the Act, 21 U.S.C.section 360bbb-3(b)(1), unless the authorization is terminated  or revoked sooner.       Influenza A by PCR NEGATIVE NEGATIVE Final   Influenza B by PCR NEGATIVE NEGATIVE Final    Comment: (NOTE) The Xpert Xpress SARS-CoV-2/FLU/RSV plus assay is intended as an aid in the diagnosis of influenza from Nasopharyngeal swab specimens and should not be used as a sole basis for treatment. Nasal washings and aspirates are unacceptable for Xpert Xpress SARS-CoV-2/FLU/RSV testing.  Fact Sheet for Patients: EntrepreneurPulse.com.au  Fact Sheet for Healthcare Providers: IncredibleEmployment.be  This test is not yet approved or cleared by the [place] FDA and has been authorized for detection and/or diagnosis of SARS-CoV-2 by FDA under an Emergency Use Authorization (  EUA). This EUA will remain in effect (meaning this test can be used) for the duration of the COVID-19 declaration under Section 564(b)(1) of the Act, 21 U.S.C. section 360bbb-3(b)(1), unless the authorization is terminated or revoked.  Performed at [facility], [address] [place], [place] [address]          Radiology Studies: CT Head Wo Contrast  Result Date: 11/29/2020 CLINICAL DATA:  Neck trauma (Age >= 65y); Head trauma, minor (Age >= 65y) EXAM: CT HEAD WITHOUT CONTRAST CT CERVICAL SPINE WITHOUT CONTRAST TECHNIQUE: Multidetector CT imaging of the head and cervical spine was performed following the standard protocol without intravenous contrast. Multiplanar CT image reconstructions of the cervical spine were also generated. COMPARISON:  12/30/2019. FINDINGS: CT HEAD FINDINGS Brain: In comparison to 12/30/2019, no substantial change in the size of a 12 mm mixed density left subdural collection.  There is a small amount of hyperdensity within the collection which could represent recent hemorrhage. No significant mass effect or midline shift. No clearly new hemorrhage. No evidence of acute large vascular territory infarct. Similar patchy white matter hypoattenuation, most likely related to chronic microvascular ischemic disease. No hydrocephalus. No mass lesion. Vascular: No hyperdense vessel identified. Calcific intracranial atherosclerosis. Skull: No acute fracture. Sinuses/Orbits: Polyp or retention cyst in the inferior left maxillary sinus. Mild ethmoid air cell mucosal thickening. Otherwise, sinuses are clear. Unremarkable orbits. Other: No mastoid effusions. CT CERVICAL SPINE FINDINGS Alignment: Similar alignment. Similar mild reversal the cervical lordosis. Similar slight widening of the C2-C3 disc space anteriorly. No substantial sagittal subluxation. Mild broad dextrocurvature. Skull base and vertebrae: Similar appearance of extensive ankylosis throughout the cervical spine from C2 inferiorly. No evidence of acute fracture. Vertebral body heights are similar to prior. Diffuse osteopenia. Soft tissues and spinal canal: No prevertebral fluid or swelling. No visible canal hematoma. Disc levels: Similar partially calcified central disc protrusion at C2-C3. Multilevel facet and uncovertebral hypertrophy. Multi level posterior osteophytic spurring which narrows the canal without evidence of high-grade bony canal stenosis. Upper chest: Biapical pleuroparenchymal scarring. Otherwise, visualized lung apices are clear. IMPRESSION: CT Head: 1. In comparison to 12/30/2019, no substantial change in size of a 12 mm mixed density left subdural collection. There is a small amount of hyperdensity within the collection, which may represent recent hemorrhage. No significant mass effect or midline shift. 2. Otherwise, no new acute abnormality. CT Cervical Spine: 1. No evidence of acute fracture or traumatic  malalignment. 2. Similar appearance of extensive ankylosis throughout the cervical spine from C2 inferiorly. 3. Diffuse osteopenia. Electronically Signed   By: Margaretha Sheffield MD   On: 11/29/2020 11:40   CT Cervical Spine Wo Contrast  Result Date: 11/29/2020 CLINICAL DATA:  Neck trauma (Age >= 65y); Head trauma, minor (Age >= 65y) EXAM: CT HEAD WITHOUT CONTRAST CT CERVICAL SPINE WITHOUT CONTRAST TECHNIQUE: Multidetector CT imaging of the head and cervical spine was performed following the standard protocol without intravenous contrast. Multiplanar CT image reconstructions of the cervical spine were also generated. COMPARISON:  12/30/2019. FINDINGS: CT HEAD FINDINGS Brain: In comparison to 12/30/2019, no substantial change in the size of a 12 mm mixed density left subdural collection. There is a small amount of hyperdensity within the collection which could represent recent hemorrhage. No significant mass effect or midline shift. No clearly new hemorrhage. No evidence of acute large vascular territory infarct. Similar patchy white matter hypoattenuation, most likely related to chronic microvascular ischemic disease. No hydrocephalus. No mass lesion. Vascular: No hyperdense vessel identified. Calcific  intracranial atherosclerosis. Skull: No acute fracture. Sinuses/Orbits: Polyp or retention cyst in the inferior left maxillary sinus. Mild ethmoid air cell mucosal thickening. Otherwise, sinuses are clear. Unremarkable orbits. Other: No mastoid effusions. CT CERVICAL SPINE FINDINGS Alignment: Similar alignment. Similar mild reversal the cervical lordosis. Similar slight widening of the C2-C3 disc space anteriorly. No substantial sagittal subluxation. Mild broad dextrocurvature. Skull base and vertebrae: Similar appearance of extensive ankylosis throughout the cervical spine from C2 inferiorly. No evidence of acute fracture. Vertebral body heights are similar to prior. Diffuse osteopenia. Soft tissues and spinal  canal: No prevertebral fluid or swelling. No visible canal hematoma. Disc levels: Similar partially calcified central disc protrusion at C2-C3. Multilevel facet and uncovertebral hypertrophy. Multi level posterior osteophytic spurring which narrows the canal without evidence of high-grade bony canal stenosis. Upper chest: Biapical pleuroparenchymal scarring. Otherwise, visualized lung apices are clear. IMPRESSION: CT Head: 1. In comparison to 12/30/2019, no substantial change in size of a 12 mm mixed density left subdural collection. There is a small amount of hyperdensity within the collection, which may represent recent hemorrhage. No significant mass effect or midline shift. 2. Otherwise, no new acute abnormality. CT Cervical Spine: 1. No evidence of acute fracture or traumatic malalignment. 2. Similar appearance of extensive ankylosis throughout the cervical spine from C2 inferiorly. 3. Diffuse osteopenia. Electronically Signed   By: Margaretha Sheffield MD   On: 11/29/2020 11:40   DG Pelvis Portable  Result Date: 11/29/2020 CLINICAL DATA:  Postop right hip hemiarthroplasty. EXAM: PORTABLE PELVIS 1-2 VIEWS COMPARISON:  Preoperative radiograph earlier today. FINDINGS: Right hip hemiarthroplasty in expected alignment. No periprosthetic lucency. Recent postsurgical change includes air and edema in the soft tissues. Lateral skin staples in place. IMPRESSION: Right hip hemiarthroplasty without immediate postoperative complication. Electronically Signed   By: Keith Rake M.D.   On: 11/29/2020 17:09   DG Pelvis Portable  Result Date: 11/29/2020 CLINICAL DATA:  Right hip replacement. EXAM: PORTABLE PELVIS 1-2 VIEWS COMPARISON:  Same day. FINDINGS: Two intraoperative images were obtained of the right hip. The femoral prosthesis is noted. Expected postoperative changes are seen in the surrounding soft tissues. IMPRESSION: Femoral prosthesis is noted. Electronically Signed   By: Marijo Conception M.D.   On:  11/29/2020 15:47   DG Pelvis Portable  Result Date: 11/29/2020 CLINICAL DATA:  Closed right hip fracture EXAM: PORTABLE PELVIS 1-2 VIEWS COMPARISON:  None. FINDINGS: There is a right femoral neck fracture with varus angulation. No subluxation or dislocation. Hip joints are symmetric. IMPRESSION: Right femoral neck fracture with varus angulation. Electronically Signed   By: Rolm Baptise M.D.   On: 11/29/2020 10:17   DG Chest Port 1 View  Result Date: 11/29/2020 CLINICAL DATA:  87 year old male status post fall at home. Right femoral neck fracture. EXAM: PORTABLE CHEST 1 VIEW COMPARISON:  None. FINDINGS: Portable AP view at 0845 hours. Low lung volumes. Mild cardiomegaly. Other mediastinal contours are within normal limits. Visualized tracheal air column is within normal limits. Allowing for portable technique the lungs are clear. No pneumothorax or pleural effusion is evident. No acute osseous abnormality identified. IMPRESSION: Low lung volumes.  No acute cardiopulmonary abnormality. Electronically Signed   By: Genevie Ann M.D.   On: 11/29/2020 09:15   DG Hip Unilat  With Pelvis 2-3 Views Right  Result Date: 11/29/2020 CLINICAL DATA:  Fall. EXAM: DG HIP (WITH OR WITHOUT PELVIS) 2-3V RIGHT COMPARISON:  No prior. FINDINGS: Diffuse severe osteopenia and degenerative change. Comminuted right femoral neck fracture with angulation  deformity noted. No evidence of dislocation. Pelvic calcifications consistent phleboliths. Dystrophic calcifications noted over the pelvis. IMPRESSION: 1. Comminuted right femoral neck fracture with angulation deformity. 2.  Severe diffuse osteopenia.  Diffuse degenerative change. Electronically Signed   By: Marcello Moores  Register   On: 11/29/2020 09:02        Scheduled Meds:  ALPRAZolam  0.5 mg Oral Daily   amLODipine  5 mg Oral Daily   docusate sodium  100 mg Oral BID   enoxaparin (LOVENOX) injection  30 mg Subcutaneous Q24H   multivitamin with minerals  1 tablet Oral Daily    sodium zirconium cyclosilicate  10 g Oral Once   Continuous Infusions:  sodium chloride 75 mL/hr at 11/30/20 0421   methocarbamol (ROBAXIN) IV       LOS: 2 days    Time spent: 30 mins    Wyvonnia Dusky, MD [facility] Pager [phone]  If 7PM-7AM, please contact night-coverage 12/01/2020, 7:28 AM

## 2020-12-02 DIAGNOSIS — N1832 Chronic kidney disease, stage 3b: Secondary | ICD-10-CM

## 2020-12-02 DIAGNOSIS — I10 Essential (primary) hypertension: Secondary | ICD-10-CM

## 2020-12-02 LAB — CBC
HCT: 30.6 % — ABNORMAL LOW (ref 39.0–52.0)
Hemoglobin: 10.1 g/dL — ABNORMAL LOW (ref 13.0–17.0)
MCH: 32.4 pg (ref 26.0–34.0)
MCHC: 33 g/dL (ref 30.0–36.0)
MCV: 98.1 fL (ref 80.0–100.0)
Platelets: 248 10*3/uL (ref 150–400)
RBC: 3.12 MIL/uL — ABNORMAL LOW (ref 4.22–5.81)
RDW: 12.2 % (ref 11.5–15.5)
WBC: 12.9 10*3/uL — ABNORMAL HIGH (ref 4.0–10.5)
nRBC: 0 % (ref 0.0–0.2)

## 2020-12-02 LAB — BASIC METABOLIC PANEL
Anion gap: 6 (ref 5–15)
BUN: 32 mg/dL — ABNORMAL HIGH (ref 8–23)
CO2: 29 mmol/L (ref 22–32)
Calcium: 8.2 mg/dL — ABNORMAL LOW (ref 8.9–10.3)
Chloride: 97 mmol/L — ABNORMAL LOW (ref 98–111)
Creatinine, Ser: 1.51 mg/dL — ABNORMAL HIGH (ref 0.61–1.24)
GFR, Estimated: 43 mL/min — ABNORMAL LOW (ref >60)
Glucose, Bld: 115 mg/dL — ABNORMAL HIGH (ref 70–99)
Potassium: 4.4 mmol/L (ref 3.5–5.1)
Sodium: 132 mmol/L — ABNORMAL LOW (ref 135–145)

## 2020-12-02 MED ORDER — COVID-19 MRNA VACC (MODERNA) 50 MCG/0.25ML IM SUSP
0.2500 mL | Freq: Once | INTRAMUSCULAR | Status: AC
  Administered 2020-12-02: 0.25 mL via INTRAMUSCULAR
  Filled 2020-12-02: qty 0.25

## 2020-12-02 NOTE — Progress Notes (Signed)
PROGRESS NOTE    Jacob Moses  [id number] DOB: 23-Jan-1928 DOA: 11/29/2020 PCP: Albina Billet, MD   Assessment & Plan:   Principal Problem:   Closed right hip fracture [facility]) Active Problems:   Fall at home, initial encounter   Bradycardia   HTN (hypertension)   Anxiety   CKD (chronic kidney disease), stage IIIb   Iron deficiency anemia  Closed right hip fracture: secondary to fall at home. S/p right hip hemiarthroplasty. Oxycodone, morphine prn for pain. Ortho surg following and recs apprec. PT/OT recs SNF. Will order 2nd [place] booster today as this is needed to go to SNF   Hyperkalemia: resolved   Leukocytosis: continues to trend down, likely reactive  Bradycardia: asymptomatic.  No chest pain or shortness of breath.    HTN: continue on amlodipine   Anxiety: severity unknown. Continue on home dose of xanax  CKDIIIb: baseline Cr 1.8-1.9. Cr is labile. Avoid nephrotoxic meds    Iron deficiency anemia: H&H are stable. Not taking any iron supplements at home      DVT prophylaxis: lovenox  Code Status: DNR Family Communication: called pt's daughter, [name], no answer but I left a message  Disposition Plan: likely d/c to SNF   Level of care: Med-Surg   Status is: Inpatient  Remains inpatient appropriate because:Unsafe d/c plan, IV treatments appropriate due to intensity of illness or inability to take PO, and Inpatient level of care appropriate due to severity of illness  Dispo: The patient is from: Home              Anticipated d/c is to: SNF              Patient currently is not medically stable to d/c.   Difficult to place patient : unclear    Consultants:  Ortho surg   Procedures:   Antimicrobials:    Subjective: Pt c/o dry mouth  Objective: Vitals:   12/01/20 0343 12/01/20 0802 12/01/20 2022 12/02/20 0443  BP: 129/67 128/68 136/71 140/66  Pulse: 60 72 72 63  Resp: '18 16 18 20  '$ Temp: 98.6 F (37 C) 97.9 F (36.6 C) 98.3 F (36.8 C) 98.2  F (36.8 C)  TempSrc: Oral Oral    SpO2: 96% 90% 94% 94%  Weight:      Height:        Intake/Output Summary (Last 24 hours) at 12/02/2020 0725 Last data filed at 12/01/2020 2100 Gross per 24 hour  Intake 480 ml  Output 950 ml  Net -470 ml   Filed Weights   11/29/20 0834  Weight: 62.1 kg    Examination:  General exam: Appears calm & comfortable   Respiratory system: clear breath sounds b/l Cardiovascular system: S1 & S2+. No rubs or clicks  Gastrointestinal system: Abd is soft, NT, ND & hypoactive bowel sounds Central nervous system: alert and oriented. Moves all extremities  Psychiatry: judgement and insight appear normal. Flat mood and affect    Data Reviewed: I have personally reviewed following labs and imaging studies  CBC: Recent Labs  Lab 11/29/20 0837 11/30/20 0613 12/01/20 0414 12/02/20 0616  WBC 8.2 19.6* 14.9* 12.9*  NEUTROABS 5.8  --   --   --   HGB 11.7* 10.6* 10.2* 10.1*  HCT 35.9* 31.7* 30.8* 30.6*  MCV 100.0 98.8 99.0 98.1  PLT 269 268 238 [id number]   Basic Metabolic Panel: Recent Labs  Lab 11/29/20 0837 11/30/20 0613 11/30/20 1635 12/01/20 0414 12/01/20 1518 12/02/20 0616  NA 136  134*  --  133*  --  132*  K 4.7 5.6* 5.1 5.2* 4.5 4.4  CL 102 103  --  100  --  97*  CO2 22 25  --  29  --  29  GLUCOSE 115* 158*  --  104*  --  115*  BUN 31* 29*  --  28*  --  32*  CREATININE 1.70* 1.52*  --  1.62*  --  1.51*  CALCIUM 9.0 8.6*  --  8.5*  --  8.2*   GFR: Estimated Creatinine Clearance: 28 mL/min (A) (by C-G formula based on SCr of 1.51 mg/dL (H)). Liver Function Tests: No results for input(s): AST, ALT, ALKPHOS, BILITOT, PROT, ALBUMIN in the last 168 hours. No results for input(s): LIPASE, AMYLASE in the last 168 hours. No results for input(s): AMMONIA in the last 168 hours. Coagulation Profile: Recent Labs  Lab 11/29/20 0837  INR 1.1   Cardiac Enzymes: No results for input(s): CKTOTAL, CKMB, CKMBINDEX, TROPONINI in the last 168  hours. BNP (last 3 results) No results for input(s): PROBNP in the last 8760 hours. HbA1C: No results for input(s): HGBA1C in the last 72 hours. CBG: No results for input(s): GLUCAP in the last 168 hours. Lipid Profile: No results for input(s): CHOL, HDL, LDLCALC, TRIG, CHOLHDL, LDLDIRECT in the last 72 hours. Thyroid Function Tests: No results for input(s): TSH, T4TOTAL, FREET4, T3FREE, THYROIDAB in the last 72 hours. Anemia Panel: No results for input(s): VITAMINB12, FOLATE, FERRITIN, TIBC, IRON, RETICCTPCT in the last 72 hours. Sepsis Labs: No results for input(s): PROCALCITON, LATICACIDVEN in the last 168 hours.  Recent Results (from the past 240 hour(s))  Resp Panel by RT-PCR (Flu A&B, Covid) Nasopharyngeal Swab     Status: None   Collection Time: 11/29/20  9:19 AM   Specimen: Nasopharyngeal Swab; Nasopharyngeal(NP) swabs in vial transport medium  Result Value Ref Range Status   SARS Coronavirus 2 by RT PCR NEGATIVE NEGATIVE Final    Comment: (NOTE) SARS-CoV-2 target nucleic acids are NOT DETECTED.  The SARS-CoV-2 RNA is generally detectable in upper respiratory specimens during the acute phase of infection. The lowest concentration of SARS-CoV-2 viral copies this assay can detect is 138 copies/mL. A negative result does not preclude SARS-Cov-2 infection and should not be used as the sole basis for treatment or other patient management decisions. A negative result may occur with  improper specimen collection/handling, submission of specimen other than nasopharyngeal swab, presence of viral mutation(s) within the areas targeted by this assay, and inadequate number of viral copies(<138 copies/mL). A negative result must be combined with clinical observations, patient history, and epidemiological information. The expected result is Negative.  Fact Sheet for Patients:  EntrepreneurPulse.com.au  Fact Sheet for Healthcare Providers:   IncredibleEmployment.be  This test is no t yet approved or cleared by the [place] FDA and  has been authorized for detection and/or diagnosis of SARS-CoV-2 by FDA under an Emergency Use Authorization (EUA). This EUA will remain  in effect (meaning this test can be used) for the duration of the COVID-19 declaration under Section 564(b)(1) of the Act, 21 U.S.C.section 360bbb-3(b)(1), unless the authorization is terminated  or revoked sooner.       Influenza A by PCR NEGATIVE NEGATIVE Final   Influenza B by PCR NEGATIVE NEGATIVE Final    Comment: (NOTE) The Xpert Xpress SARS-CoV-2/FLU/RSV plus assay is intended as an aid in the diagnosis of influenza from Nasopharyngeal swab specimens and should not be used as a sole  basis for treatment. Nasal washings and aspirates are unacceptable for Xpert Xpress SARS-CoV-2/FLU/RSV testing.  Fact Sheet for Patients: EntrepreneurPulse.com.au  Fact Sheet for Healthcare Providers: IncredibleEmployment.be  This test is not yet approved or cleared by the [place] FDA and has been authorized for detection and/or diagnosis of SARS-CoV-2 by FDA under an Emergency Use Authorization (EUA). This EUA will remain in effect (meaning this test can be used) for the duration of the COVID-19 declaration under Section 564(b)(1) of the Act, 21 U.S.C. section 360bbb-3(b)(1), unless the authorization is terminated or revoked.  Performed at [facility], [address], [place], [place] [address]          Radiology Studies: No results found.      Scheduled Meds:  ALPRAZolam  0.5 mg Oral QHS   amLODipine  5 mg Oral Daily   docusate sodium  100 mg Oral BID   enoxaparin (LOVENOX) injection  30 mg Subcutaneous Q24H   multivitamin with minerals  1 tablet Oral Daily   traZODone  25 mg Oral QHS   Continuous Infusions:  sodium chloride 75 mL/hr at 11/30/20 0421   methocarbamol  (ROBAXIN) IV       LOS: 3 days    Time spent: 25 mins    Wyvonnia Dusky, MD [facility] Pager [phone]  If 7PM-7AM, please contact night-coverage 12/02/2020, 7:25 AM

## 2020-12-02 NOTE — Progress Notes (Signed)
  Subjective: 3 Days Post-Op Procedure(s) (LRB): ARTHROPLASTY BIPOLAR HIP (HEMIARTHROPLASTY)-Extra Scrub (Right) Patient reports pain as well-controlled.   Patient is well, and has had no acute complaints or problems, but states he is not receiving his normal home medicines that help him sleep at night. Daughter will be in later to provide specific medication regimen for night. Plan is to go Skilled nursing facility after hospital stay. Negative for chest pain and shortness of breath Fever: no Gastrointestinal: negative for nausea and vomiting.    Objective: Vital signs in last 24 hours: Temp:  [98.2 F (36.8 C)-98.3 F (36.8 C)] 98.3 F (36.8 C) (07/31 0735) Pulse Rate:  [49-72] 49 (07/31 0735) Resp:  [18-20] 20 (07/31 0735) BP: (136-140)/(66-74) 136/74 (07/31 0735) SpO2:  [92 %-94 %] 92 % (07/31 0735)  Intake/Output from previous day:  Intake/Output Summary (Last 24 hours) at 12/02/2020 1112 Last data filed at 12/02/2020 1029 Gross per 24 hour  Intake 851.83 ml  Output 950 ml  Net -98.17 ml    Intake/Output this shift: Total I/O In: 611.8 [P.O.:240; I.V.:371.8] Out: -   Labs: Recent Labs    11/30/20 0613 12/01/20 0414 12/02/20 0616  HGB 10.6* 10.2* 10.1*   Recent Labs    12/01/20 0414 12/02/20 0616  WBC 14.9* 12.9*  RBC 3.11* 3.12*  HCT 30.8* 30.6*  PLT 238 248   Recent Labs    12/01/20 0414 12/01/20 1518 12/02/20 0616  NA 133*  --  132*  K 5.2* 4.5 4.4  CL 100  --  97*  CO2 29  --  29  BUN 28*  --  32*  CREATININE 1.62*  --  1.51*  GLUCOSE 104*  --  115*  CALCIUM 8.5*  --  8.2*   No results for input(s): LABPT, INR in the last 72 hours.   EXAM General - Patient is Alert, Appropriate, and Oriented Extremity - Neurovascular intact Dorsiflexion/Plantar flexion intact Compartment soft Dressing/Incision -clean, dry, no drainage Motor Function - intact, moving foot and toes well on exam.    Assessment/Plan: 3 Days Post-Op Procedure(s)  (LRB): ARTHROPLASTY BIPOLAR HIP (HEMIARTHROPLASTY)-Extra Scrub (Right) Principal Problem:   Closed right hip fracture (HCC) Active Problems:   Fall at home, initial encounter   Bradycardia   HTN (hypertension)   Anxiety   CKD (chronic kidney disease), stage IIIb   Iron deficiency anemia  Estimated body mass index is 19.11 kg/m as calculated from the following:   Height as of this encounter: '5\' 11"'$  (1.803 m).   Weight as of this encounter: 62.1 kg. Advance diet Up with therapy Discharge to SNF   Maintain posterior hip precautions. Precautions reviewed with patient.     DVT Prophylaxis - Lovenox, Ted hose, and SCDs Weight-Bearing as tolerated to right leg  Cassell Smiles, PA-C [facility] Orthopaedic Surgery 12/02/2020, 11:12 AM

## 2020-12-03 DIAGNOSIS — E871 Hypo-osmolality and hyponatremia: Secondary | ICD-10-CM

## 2020-12-03 LAB — BASIC METABOLIC PANEL
Anion gap: 7 (ref 5–15)
BUN: 35 mg/dL — ABNORMAL HIGH (ref 8–23)
CO2: 28 mmol/L (ref 22–32)
Calcium: 8.3 mg/dL — ABNORMAL LOW (ref 8.9–10.3)
Chloride: 96 mmol/L — ABNORMAL LOW (ref 98–111)
Creatinine, Ser: 1.58 mg/dL — ABNORMAL HIGH (ref 0.61–1.24)
GFR, Estimated: 41 mL/min — ABNORMAL LOW (ref >60)
Glucose, Bld: 117 mg/dL — ABNORMAL HIGH (ref 70–99)
Potassium: 4.4 mmol/L (ref 3.5–5.1)
Sodium: 131 mmol/L — ABNORMAL LOW (ref 135–145)

## 2020-12-03 LAB — RESP PANEL BY RT-PCR (FLU A&B, COVID) ARPGX2
Influenza A by PCR: NEGATIVE
Influenza B by PCR: NEGATIVE
SARS Coronavirus 2 by RT PCR: NEGATIVE

## 2020-12-03 LAB — CBC
HCT: 31.6 % — ABNORMAL LOW (ref 39.0–52.0)
Hemoglobin: 10.3 g/dL — ABNORMAL LOW (ref 13.0–17.0)
MCH: 31.8 pg (ref 26.0–34.0)
MCHC: 32.6 g/dL (ref 30.0–36.0)
MCV: 97.5 fL (ref 80.0–100.0)
Platelets: 264 10*3/uL (ref 150–400)
RBC: 3.24 MIL/uL — ABNORMAL LOW (ref 4.22–5.81)
RDW: 11.9 % (ref 11.5–15.5)
WBC: 13.2 10*3/uL — ABNORMAL HIGH (ref 4.0–10.5)
nRBC: 0 % (ref 0.0–0.2)

## 2020-12-03 LAB — SURGICAL PATHOLOGY

## 2020-12-03 MED ORDER — SODIUM CHLORIDE 0.9 % IV SOLN
6.2500 mg | Freq: Four times a day (QID) | INTRAVENOUS | Status: DC | PRN
  Administered 2020-12-03: 6.25 mg via INTRAVENOUS
  Filled 2020-12-03 (×2): qty 0.25

## 2020-12-03 MED ORDER — POLYETHYLENE GLYCOL 3350 17 G PO PACK
17.0000 g | PACK | Freq: Every day | ORAL | Status: DC
  Administered 2020-12-03: 17 g via ORAL
  Filled 2020-12-03: qty 1

## 2020-12-03 MED ORDER — ENOXAPARIN SODIUM 40 MG/0.4ML IJ SOSY: 30.0000 mg | PREFILLED_SYRINGE | INTRAMUSCULAR | 0 refills | Status: DC

## 2020-12-03 MED ORDER — SCOPOLAMINE 1 MG/3DAYS TD PT72
1.0000 | MEDICATED_PATCH | TRANSDERMAL | Status: DC
  Administered 2020-12-03: 1.5 mg via TRANSDERMAL
  Filled 2020-12-03: qty 1

## 2020-12-03 NOTE — Progress Notes (Signed)
Physician Discharge Summary  Jacob Moses [id number] DOB: [birth year] DOA: 11/29/2020  PCP: Jacob Billet, MD  Admit date: 11/29/2020 Discharge date: 12/03/2020  Admitted From: home  Disposition:  SNF  Recommendations for Outpatient Follow-up:  Follow up with PCP in 1-2 weeks F/u w/ ortho surg, Jacob Moses, in 2 weeks  Home Health: no Equipment/Devices:  Discharge Condition: stable  CODE STATUS: full  Diet recommendation: Regular   Brief/Interim Summary: HPI was taken from Jacob Moses: Jacob Moses is a 87 y.o. male with medical history significant of HTN, anxiety, HOH, gastric ulcer, vertigo, anemia, insomnia, who presents with fall and right hip pain.    Per pt's daughter at bedside, pt fell when he was bending forward to pick up something on the ground and lost balance in this AM. No LOC.  Patient is not very sure if he injured his head or neck.  He fell onto his right hip. He developed pain in right hip, which is constant, sharp, severe, throbbing, nonradiating.  Patient does not have numbness or tingling in extremities.  No facial droop or slurred speech. Patient does not have chest pain, cough, shortness breath.  No nausea, vomiting, diarrhea or abdominal pain.  Patient is mildly constipated.   ED Course: pt was found to have WBC 8.2, troponin level 9, pending COVID-19 test, renal function close to baseline, temperature normal, blood pressure 187/85, bradycardia with heart rate 40-50s, RR 16, oxygen saturation 100% on room air.  Chest x-ray negative for infiltration.  X-ray of right hip/pelvis that showed comminuted right femoral neck fracture.  Patient is admitted to [facility] as inpatient.  Jacob Moses of Ortho and Jacob Moses of cardiology are consulted   Hospital course from Jacob Moses 7/29-12/03/20: Pt presented after a fall at home and was found to have closed right hip fracture. Pt is s/p right hip hemiarthroplasty. Ortho surg recommended lovenox, ted hose & SCDs for DVT  prophylaxis. PT/OT evaluated the pt and recommended SNF. Of note, pt was found to hyponatremia of unknown etiology that did not improve w/ IVFs, would recommend fluid restriction 1264m and recheck Na level.  Discharge Diagnoses:  Principal Problem:   Closed right hip fracture (Jacob Moses Active Problems:   Fall at home, initial encounter   Bradycardia   HTN (hypertension)   Anxiety   CKD (chronic kidney disease), stage IIIb   Iron deficiency anemia  Closed right hip fracture: secondary to fall at home. S/p right hip hemiarthroplasty. Oxycodone, morphine prn for pain. Ortho surg following and recs apprec. PT/OT recs SNF. Will order 2nd CHarlembooster today as this is needed to go to SNF   Hyponatremia: stop IVFs. Fluid restriction to 1200 ml x 2 days and recheck Na level   Hyperkalemia: resolved   Leukocytosis: continues to trend down, likely reactive  Bradycardia: asymptomatic.  No chest pain or shortness of breath.    HTN: continue on amlodipine   Anxiety: severity unknown. Continue on home dose of xanax  CKDIIIb: baseline Cr 1.8-1.9. Cr is labile. Avoid nephrotoxic meds    Iron deficiency anemia: H&H are stable. Not taking any iron supplements at home   Discharge Instructions  Discharge Instructions     Diet general   Complete by: As directed    Discharge instructions   Complete by: As directed    F/u w/ PCP in 1-2 weeks. F/u w/ ortho surg., Jacob Moses in 2 weeks.   Increase activity slowly   Complete by: As directed  No wound care   Complete by: As directed       Allergies as of 12/03/2020   No Known Allergies      Medication List     TAKE these medications    ALPRAZolam 0.5 MG 24 hr tablet Commonly known as: XANAX XR Take 0.5 mg by mouth daily.   amLODipine 5 MG tablet Commonly known as: NORVASC Take 1 tablet (5 mg total) by mouth daily.   enoxaparin 40 MG/0.4ML injection Commonly known as: LOVENOX Inject 0.4 mLs (40 mg total) into the skin daily  for 14 days.   multivitamin with minerals tablet Take 1 tablet by mouth daily.   ondansetron 4 MG tablet Commonly known as: ZOFRAN Take 1 tablet (4 mg total) by mouth every 6 (six) hours as needed for nausea.   oxyCODONE 5 MG immediate release tablet Commonly known as: Oxy IR/ROXICODONE Take 0.5-1 tablets (2.5-5 mg total) by mouth every 4 (four) hours as needed for moderate pain (pain score 4-6).   traMADol 50 MG tablet Commonly known as: ULTRAM Take 1 tablet (50 mg total) by mouth every 6 (six) hours as needed for moderate pain.   traZODone 50 MG tablet Commonly known as: DESYREL Take 0.5 tablets (25 mg total) by mouth at bedtime as needed for sleep.        Follow-up Information     Jacob Dixon, PA-C Follow up in 2 week(s).   Specialty: Orthopedic Surgery Why: For staple removal and right hip x-rays Contact information: [address] [place] [address] [phone]                No Known Allergies  Consultations: Ortho surg, Jacob Moses   Procedures/Studies: CT Head Wo Contrast  Result Date: 11/29/2020 CLINICAL DATA:  Neck trauma (Age >= 65y); Head trauma, minor (Age >= 65y) EXAM: CT HEAD WITHOUT CONTRAST CT CERVICAL SPINE WITHOUT CONTRAST TECHNIQUE: Multidetector CT imaging of the head and cervical spine was performed following the standard protocol without intravenous contrast. Multiplanar CT image reconstructions of the cervical spine were also generated. COMPARISON:  12/30/2019. FINDINGS: CT HEAD FINDINGS Brain: In comparison to 12/30/2019, no substantial change in the size of a 12 mm mixed density left subdural collection. There is a small amount of hyperdensity within the collection which could represent recent hemorrhage. No significant mass effect or midline shift. No clearly new hemorrhage. No evidence of acute large vascular territory infarct. Similar patchy white matter hypoattenuation, most likely related to chronic  microvascular ischemic disease. No hydrocephalus. No mass lesion. Vascular: No hyperdense vessel identified. Calcific intracranial atherosclerosis. Skull: No acute fracture. Sinuses/Orbits: Polyp or retention cyst in the inferior left maxillary sinus. Mild ethmoid air cell mucosal thickening. Otherwise, sinuses are clear. Unremarkable orbits. Other: No mastoid effusions. CT CERVICAL SPINE FINDINGS Alignment: Similar alignment. Similar mild reversal the cervical lordosis. Similar slight widening of the C2-C3 disc space anteriorly. No substantial sagittal subluxation. Mild broad dextrocurvature. Skull base and vertebrae: Similar appearance of extensive ankylosis throughout the cervical spine from C2 inferiorly. No evidence of acute fracture. Vertebral body heights are similar to prior. Diffuse osteopenia. Soft tissues and spinal canal: No prevertebral fluid or swelling. No visible canal hematoma. Disc levels: Similar partially calcified central disc protrusion at C2-C3. Multilevel facet and uncovertebral hypertrophy. Multi level posterior osteophytic spurring which narrows the canal without evidence of high-grade bony canal stenosis. Upper chest: Biapical pleuroparenchymal scarring. Otherwise, visualized lung apices are clear. IMPRESSION: CT Head: 1. In comparison  to 12/30/2019, no substantial change in size of a 12 mm mixed density left subdural collection. There is a small amount of hyperdensity within the collection, which may represent recent hemorrhage. No significant mass effect or midline shift. 2. Otherwise, no new acute abnormality. CT Cervical Spine: 1. No evidence of acute fracture or traumatic malalignment. 2. Similar appearance of extensive ankylosis throughout the cervical spine from C2 inferiorly. 3. Diffuse osteopenia. Electronically Signed   By: Margaretha Sheffield MD   On: 11/29/2020 11:40   CT Cervical Spine Wo Contrast  Result Date: 11/29/2020 CLINICAL DATA:  Neck trauma (Age >= 65y); Head  trauma, minor (Age >= 65y) EXAM: CT HEAD WITHOUT CONTRAST CT CERVICAL SPINE WITHOUT CONTRAST TECHNIQUE: Multidetector CT imaging of the head and cervical spine was performed following the standard protocol without intravenous contrast. Multiplanar CT image reconstructions of the cervical spine were also generated. COMPARISON:  12/30/2019. FINDINGS: CT HEAD FINDINGS Brain: In comparison to 12/30/2019, no substantial change in the size of a 12 mm mixed density left subdural collection. There is a small amount of hyperdensity within the collection which could represent recent hemorrhage. No significant mass effect or midline shift. No clearly new hemorrhage. No evidence of acute large vascular territory infarct. Similar patchy white matter hypoattenuation, most likely related to chronic microvascular ischemic disease. No hydrocephalus. No mass lesion. Vascular: No hyperdense vessel identified. Calcific intracranial atherosclerosis. Skull: No acute fracture. Sinuses/Orbits: Polyp or retention cyst in the inferior left maxillary sinus. Mild ethmoid air cell mucosal thickening. Otherwise, sinuses are clear. Unremarkable orbits. Other: No mastoid effusions. CT CERVICAL SPINE FINDINGS Alignment: Similar alignment. Similar mild reversal the cervical lordosis. Similar slight widening of the C2-C3 disc space anteriorly. No substantial sagittal subluxation. Mild broad dextrocurvature. Skull base and vertebrae: Similar appearance of extensive ankylosis throughout the cervical spine from C2 inferiorly. No evidence of acute fracture. Vertebral body heights are similar to prior. Diffuse osteopenia. Soft tissues and spinal canal: No prevertebral fluid or swelling. No visible canal hematoma. Disc levels: Similar partially calcified central disc protrusion at C2-C3. Multilevel facet and uncovertebral hypertrophy. Multi level posterior osteophytic spurring which narrows the canal without evidence of high-grade bony canal stenosis.  Upper chest: Biapical pleuroparenchymal scarring. Otherwise, visualized lung apices are clear. IMPRESSION: CT Head: 1. In comparison to 12/30/2019, no substantial change in size of a 12 mm mixed density left subdural collection. There is a small amount of hyperdensity within the collection, which may represent recent hemorrhage. No significant mass effect or midline shift. 2. Otherwise, no new acute abnormality. CT Cervical Spine: 1. No evidence of acute fracture or traumatic malalignment. 2. Similar appearance of extensive ankylosis throughout the cervical spine from C2 inferiorly. 3. Diffuse osteopenia. Electronically Signed   By: Margaretha Sheffield MD   On: 11/29/2020 11:40   DG Pelvis Portable  Result Date: 11/29/2020 CLINICAL DATA:  Postop right hip hemiarthroplasty. EXAM: PORTABLE PELVIS 1-2 VIEWS COMPARISON:  Preoperative radiograph earlier today. FINDINGS: Right hip hemiarthroplasty in expected alignment. No periprosthetic lucency. Recent postsurgical change includes air and edema in the soft tissues. Lateral skin staples in place. IMPRESSION: Right hip hemiarthroplasty without immediate postoperative complication. Electronically Signed   By: Keith Rake M.D.   On: 11/29/2020 17:09   DG Pelvis Portable  Result Date: 11/29/2020 CLINICAL DATA:  Right hip replacement. EXAM: PORTABLE PELVIS 1-2 VIEWS COMPARISON:  Same day. FINDINGS: Two intraoperative images were obtained of the right hip. The femoral prosthesis is noted. Expected postoperative changes are seen in the surrounding  soft tissues. IMPRESSION: Femoral prosthesis is noted. Electronically Signed   By: Marijo Conception M.D.   On: 11/29/2020 15:47   DG Pelvis Portable  Result Date: 11/29/2020 CLINICAL DATA:  Closed right hip fracture EXAM: PORTABLE PELVIS 1-2 VIEWS COMPARISON:  None. FINDINGS: There is a right femoral neck fracture with varus angulation. No subluxation or dislocation. Hip joints are symmetric. IMPRESSION: Right femoral  neck fracture with varus angulation. Electronically Signed   By: Rolm Baptise M.D.   On: 11/29/2020 10:17   DG Chest Port 1 View  Result Date: 11/29/2020 CLINICAL DATA:  87 year old male status post fall at home. Right femoral neck fracture. EXAM: PORTABLE CHEST 1 VIEW COMPARISON:  None. FINDINGS: Portable AP view at 0845 hours. Low lung volumes. Mild cardiomegaly. Other mediastinal contours are within normal limits. Visualized tracheal air column is within normal limits. Allowing for portable technique the lungs are clear. No pneumothorax or pleural effusion is evident. No acute osseous abnormality identified. IMPRESSION: Low lung volumes.  No acute cardiopulmonary abnormality. Electronically Signed   By: Genevie Ann M.D.   On: 11/29/2020 09:15   DG Hip Unilat  With Pelvis 2-3 Views Right  Result Date: 11/29/2020 CLINICAL DATA:  Fall. EXAM: DG HIP (WITH OR WITHOUT PELVIS) 2-3V RIGHT COMPARISON:  No prior. FINDINGS: Diffuse severe osteopenia and degenerative change. Comminuted right femoral neck fracture with angulation deformity noted. No evidence of dislocation. Pelvic calcifications consistent phleboliths. Dystrophic calcifications noted over the pelvis. IMPRESSION: 1. Comminuted right femoral neck fracture with angulation deformity. 2.  Severe diffuse osteopenia.  Diffuse degenerative change. Electronically Signed   By: Marcello Moores  Register   On: 11/29/2020 09:02   (Echo, Carotid, EGD, Colonoscopy, ERCP)    Subjective: pt c/o intermittent nausea    Discharge Exam: Vitals:   12/03/20 0418 12/03/20 0751  BP: (!) 143/76 131/69  Pulse: 67 66  Resp: 18 16  Temp: 98.2 F (36.8 C) 98.3 F (36.8 C)  SpO2: 99% 96%   Vitals:   12/02/20 1659 12/02/20 2135 12/03/20 0418 12/03/20 0751  BP: (!) 143/72 117/62 (!) 143/76 131/69  Pulse: 78 65 67 66  Resp: '18 18 18 16  '$ Temp: 97.8 F (36.6 C) 98.8 F (37.1 C) 98.2 F (36.8 C) 98.3 F (36.8 C)  TempSrc: Oral     SpO2: 95% 96% 99% 96%  Weight:       Height:        General: Pt is alert, awake, not in acute distress Cardiovascular: S1/S2 +, no rubs, no gallops Respiratory: CTA bilaterally, no wheezing, no rhonchi Abdominal: Soft, NT, ND, bowel sounds + Extremities:  no cyanosis    The results of significant diagnostics from this hospitalization (including imaging, microbiology, ancillary and laboratory) are listed below for reference.     Microbiology: Recent Results (from the past 240 hour(s))  Resp Panel by RT-PCR (Flu A&B, Covid) Nasopharyngeal Swab     Status: None   Collection Time: 11/29/20  9:19 AM   Specimen: Nasopharyngeal Swab; Nasopharyngeal(NP) swabs in vial transport medium  Result Value Ref Range Status   SARS Coronavirus 2 by RT PCR NEGATIVE NEGATIVE Final    Comment: (NOTE) SARS-CoV-2 target nucleic acids are NOT DETECTED.  The SARS-CoV-2 RNA is generally detectable in upper respiratory specimens during the acute phase of infection. The lowest concentration of SARS-CoV-2 viral copies this assay can detect is 138 copies/mL. A negative result does not preclude SARS-Cov-2 infection and should not be used as the sole basis for treatment  or other patient management decisions. A negative result may occur with  improper specimen collection/handling, submission of specimen other than nasopharyngeal swab, presence of viral mutation(s) within the areas targeted by this assay, and inadequate number of viral copies(<138 copies/mL). A negative result must be combined with clinical observations, patient history, and epidemiological information. The expected result is Negative.  Fact Sheet for Patients:  EntrepreneurPulse.com.au  Fact Sheet for Healthcare Providers:  IncredibleEmployment.be  This test is no t yet approved or cleared by the [place] FDA and  has been authorized for detection and/or diagnosis of SARS-CoV-2 by FDA under an Emergency Use Authorization (EUA). This  EUA will remain  in effect (meaning this test can be used) for the duration of the COVID-19 declaration under Section 564(b)(1) of the Act, 21 U.S.C.section 360bbb-3(b)(1), unless the authorization is terminated  or revoked sooner.       Influenza A by PCR NEGATIVE NEGATIVE Final   Influenza B by PCR NEGATIVE NEGATIVE Final    Comment: (NOTE) The Xpert Xpress SARS-CoV-2/FLU/RSV plus assay is intended as an aid in the diagnosis of influenza from Nasopharyngeal swab specimens and should not be used as a sole basis for treatment. Nasal washings and aspirates are unacceptable for Xpert Xpress SARS-CoV-2/FLU/RSV testing.  Fact Sheet for Patients: EntrepreneurPulse.com.au  Fact Sheet for Healthcare Providers: IncredibleEmployment.be  This test is not yet approved or cleared by the [place] FDA and has been authorized for detection and/or diagnosis of SARS-CoV-2 by FDA under an Emergency Use Authorization (EUA). This EUA will remain in effect (meaning this test can be used) for the duration of the COVID-19 declaration under Section 564(b)(1) of the Act, 21 U.S.C. section 360bbb-3(b)(1), unless the authorization is terminated or revoked.  Performed at [facility], [address] [place], [place] [address]      Labs: BNP (last 3 results) No results for input(s): BNP in the last 8760 hours. Basic Metabolic Panel: Recent Labs  Lab 11/29/20 0837 11/30/20 0613 11/30/20 1635 12/01/20 0414 12/01/20 1518 12/02/20 0616 12/03/20 0423  NA 136 134*  --  133*  --  132* 131*  K 4.7 5.6* 5.1 5.2* 4.5 4.4 4.4  CL 102 103  --  100  --  97* 96*  CO2 22 25  --  29  --  29 28  GLUCOSE 115* 158*  --  104*  --  115* 117*  BUN 31* 29*  --  28*  --  32* 35*  CREATININE 1.70* 1.52*  --  1.62*  --  1.51* 1.58*  CALCIUM 9.0 8.6*  --  8.5*  --  8.2* 8.3*   Liver Function Tests: No results for input(s): AST, ALT, ALKPHOS, BILITOT, PROT,  ALBUMIN in the last 168 hours. No results for input(s): LIPASE, AMYLASE in the last 168 hours. No results for input(s): AMMONIA in the last 168 hours. CBC: Recent Labs  Lab 11/29/20 0837 11/30/20 0613 12/01/20 0414 12/02/20 0616 12/03/20 0423  WBC 8.2 19.6* 14.9* 12.9* 13.2*  NEUTROABS 5.8  --   --   --   --   HGB 11.7* 10.6* 10.2* 10.1* 10.3*  HCT 35.9* 31.7* 30.8* 30.6* 31.6*  MCV 100.0 98.8 99.0 98.1 97.5  PLT 269 268 238 248 264   Cardiac Enzymes: No results for input(s): CKTOTAL, CKMB, CKMBINDEX, TROPONINI in the last 168 hours. BNP: Invalid input(s): POCBNP CBG: No results for input(s): GLUCAP in the last 168 hours. D-Dimer No results for input(s): DDIMER in the last 72  hours. Hgb A1c No results for input(s): HGBA1C in the last 72 hours. Lipid Profile No results for input(s): CHOL, HDL, LDLCALC, TRIG, CHOLHDL, LDLDIRECT in the last 72 hours. Thyroid function studies No results for input(s): TSH, T4TOTAL, T3FREE, THYROIDAB in the last 72 hours.  Invalid input(s): FREET3 Anemia work up No results for input(s): VITAMINB12, FOLATE, FERRITIN, TIBC, IRON, RETICCTPCT in the last 72 hours. Urinalysis    Component Value Date/Time   COLORURINE YELLOW (A) 02/22/2019 1305   APPEARANCEUR HAZY (A) 02/22/2019 1305   LABSPEC 1.019 02/22/2019 1305   PHURINE 5.0 02/22/2019 1305   GLUCOSEU NEGATIVE 02/22/2019 1305   HGBUR LARGE (A) 02/22/2019 1305   BILIRUBINUR NEGATIVE 02/22/2019 1305   KETONESUR 5 (A) 02/22/2019 1305   PROTEINUR 100 (A) 02/22/2019 1305   NITRITE NEGATIVE 02/22/2019 1305   LEUKOCYTESUR NEGATIVE 02/22/2019 1305   Sepsis Labs Invalid input(s): PROCALCITONIN,  WBC,  LACTICIDVEN Microbiology Recent Results (from the past 240 hour(s))  Resp Panel by RT-PCR (Flu A&B, Covid) Nasopharyngeal Swab     Status: None   Collection Time: 11/29/20  9:19 AM   Specimen: Nasopharyngeal Swab; Nasopharyngeal(NP) swabs in vial transport medium  Result Value Ref Range Status    SARS Coronavirus 2 by RT PCR NEGATIVE NEGATIVE Final    Comment: (NOTE) SARS-CoV-2 target nucleic acids are NOT DETECTED.  The SARS-CoV-2 RNA is generally detectable in upper respiratory specimens during the acute phase of infection. The lowest concentration of SARS-CoV-2 viral copies this assay can detect is 138 copies/mL. A negative result does not preclude SARS-Cov-2 infection and should not be used as the sole basis for treatment or other patient management decisions. A negative result may occur with  improper specimen collection/handling, submission of specimen other than nasopharyngeal swab, presence of viral mutation(s) within the areas targeted by this assay, and inadequate number of viral copies(<138 copies/mL). A negative result must be combined with clinical observations, patient history, and epidemiological information. The expected result is Negative.  Fact Sheet for Patients:  EntrepreneurPulse.com.au  Fact Sheet for Healthcare Providers:  IncredibleEmployment.be  This test is no t yet approved or cleared by the [place] FDA and  has been authorized for detection and/or diagnosis of SARS-CoV-2 by FDA under an Emergency Use Authorization (EUA). This EUA will remain  in effect (meaning this test can be used) for the duration of the COVID-19 declaration under Section 564(b)(1) of the Act, 21 U.S.C.section 360bbb-3(b)(1), unless the authorization is terminated  or revoked sooner.       Influenza A by PCR NEGATIVE NEGATIVE Final   Influenza B by PCR NEGATIVE NEGATIVE Final    Comment: (NOTE) The Xpert Xpress SARS-CoV-2/FLU/RSV plus assay is intended as an aid in the diagnosis of influenza from Nasopharyngeal swab specimens and should not be used as a sole basis for treatment. Nasal washings and aspirates are unacceptable for Xpert Xpress SARS-CoV-2/FLU/RSV testing.  Fact Sheet for  Patients: EntrepreneurPulse.com.au  Fact Sheet for Healthcare Providers: IncredibleEmployment.be  This test is not yet approved or cleared by the [place] FDA and has been authorized for detection and/or diagnosis of SARS-CoV-2 by FDA under an Emergency Use Authorization (EUA). This EUA will remain in effect (meaning this test can be used) for the duration of the COVID-19 declaration under Section 564(b)(1) of the Act, 21 U.S.C. section 360bbb-3(b)(1), unless the authorization is terminated or revoked.  Performed at [facility], [address], [place], [place] [address]      Time coordinating discharge: Over 27  minutes  SIGNED:   Wyvonnia Dusky, MD  [facility] 12/03/2020, 12:33 PM Pager   If 7PM-7AM, please contact night-coverage

## 2020-12-03 NOTE — Progress Notes (Signed)
Physical Therapy Treatment Patient Details Name: Jacob Moses MRN: [id number] DOB: [birth year] Today's Date: 12/03/2020    History of Present Illness Jacob Moses is a 87 y.o. male with medical history significant of HTN, anxiety, HOH, gastric ulcer, vertigo, anemia, insomnia, who presents with fall and right hip pain.  S/P right hip hemiarthroplasty.    PT Comments    Pt sliding out of recliner. Assisted with RN back to bed with min/mod a x 2.  Physical assist to move RLE towards bed.  Returned to supine to await transfer to SNF.   Follow Up Recommendations  SNF;Supervision/Assistance - 24 hour     Equipment Recommendations  Rolling walker with 5" wheels    Recommendations for Other Services       Precautions / Restrictions Precautions Precautions: Fall;Posterior Hip Precaution Booklet Issued: No Restrictions Weight Bearing Restrictions: No RLE Weight Bearing: Weight bearing as tolerated    Mobility  Bed Mobility Overal bed mobility: Needs Assistance Bed Mobility: Sit to Supine     Supine to sit: Min assist Sit to supine: Mod assist;+2 for physical assistance        Transfers Overall transfer level: Needs assistance Equipment used: Rolling walker (2 wheeled) Transfers: Sit to/from Stand Sit to Stand: Min assist;Mod assist;+2 physical assistance            Ambulation/Gait Ambulation/Gait assistance: Min assist;+2 physical assistance;Mod assist Gait Distance (Feet): 3 Feet Assistive device: Rolling walker (2 wheeled) Gait Pattern/deviations: Decreased step length - right;Decreased step length - left;Decreased dorsiflexion - right;Decreased dorsiflexion - left;Decreased stride length;Decreased weight shift to right;Decreased weight shift to left;Trunk flexed Gait velocity: decreased   General Gait Details: increased assist due to fatigue   Stairs             Wheelchair Mobility    Modified Rankin (Stroke Patients Only)       Balance  Overall balance assessment: Needs assistance Sitting-balance support: Feet supported Sitting balance-Leahy Scale: Good     Standing balance support: Bilateral upper extremity supported;During functional activity   Standing balance comment: does well for standing from nursing care but fatigued needing increased assist to get to recliner due to fatigue.                            Cognition Arousal/Alertness: Awake/alert Behavior During Therapy: WFL for tasks assessed/performed Overall Cognitive Status: Within Functional Limits for tasks assessed                                        Exercises Total Joint Exercises Quad Sets: AROM;Right;Strengthening;10 reps;Supine Short Arc Quad: AAROM;Strengthening;Right;10 reps;Supine Heel Slides: AAROM;Strengthening;Right;10 reps;Supine Hip ABduction/ADduction: AAROM;Right;10 reps;Supine (hip abduction slides) Straight Leg Raises: AAROM;Right;10 reps;Supine    General Comments        Pertinent Vitals/Pain Pain Assessment: Faces Pain Score: 2  Faces Pain Scale: Hurts little more Pain Location: R LE with movement Pain Descriptors / Indicators: Grimacing;Sore;Guarding Pain Intervention(s): Limited activity within patient's tolerance;Monitored during session;Repositioned    Home Living                      Prior Function            PT Goals (current goals can now be found in the care plan section) Progress towards PT goals: Progressing toward goals    Frequency  BID      PT Plan Current plan remains appropriate    Co-evaluation              AM-PAC PT "6 Clicks" Mobility   Outcome Measure  Help needed turning from your back to your side while in a flat bed without using bedrails?: A Lot Help needed moving from lying on your back to sitting on the side of a flat bed without using bedrails?: A Lot Help needed moving to and from a bed to a chair (including a wheelchair)?: A Lot Help  needed standing up from a chair using your arms (e.g., wheelchair or bedside chair)?: A Lot Help needed to walk in hospital room?: A Lot Help needed climbing 3-5 steps with a railing? : Total 6 Click Score: 11    End of Session Equipment Utilized During Treatment: Gait belt Activity Tolerance: Patient limited by pain;Patient limited by fatigue Patient left: with bed alarm set;with call bell/phone within reach;in bed;with family/visitor present;with SCD's reapplied Nurse Communication: Mobility status PT Visit Diagnosis: Unsteadiness on feet (R26.81);History of falling (Z91.81);Muscle weakness (generalized) (M62.81);Other abnormalities of gait and mobility (R26.89);Difficulty in walking, not elsewhere classified (R26.2);Pain Pain - Right/Left: Right Pain - part of body: Leg;Hip     Time: [id number] PT Time Calculation (min) (ACUTE ONLY): 8 min  Charges:  $Therapeutic Exercise: 8-22 mins $Therapeutic Activity: 8-22 mins                    Chesley Noon, PTA 12/03/20, 2:06 PM , 2:05 PM

## 2020-12-03 NOTE — Plan of Care (Signed)

## 2020-12-03 NOTE — TOC Progression Note (Signed)
Transition of Care [facility]) - Progression Note    Patient Details  Name: Jacob Moses MRN: [id number] Date of Birth: 1927/11/04  Transition of Care [facility]) CM/SW [place], RN Phone Number: 12/03/2020, 3:10 PM  Clinical Narrative:    EMS will come to transport the patient to [facility], Daughter [name] is aware        Expected Discharge Plan and Services           Expected Discharge Date: 12/03/20                                     Social Determinants of Health (SDOH) Interventions    Readmission Risk Interventions No flowsheet data found.

## 2020-12-03 NOTE — Care Management Important Message (Signed)
Important Message  Patient Details  Name: AAMER BARRICKLOW MRN: [id number] Date of Birth: 10/16/1927   Medicare Important Message Given:  Yes     Juliann Pulse A Lannie Heaps 12/03/2020, 2:24 PM

## 2020-12-03 NOTE — Progress Notes (Signed)
Physical Therapy Treatment Patient Details Name: Jacob Moses MRN: [id number] DOB: [birth year] Today's Date: 12/03/2020    History of Present Illness Jacob Moses is a 87 y.o. male with medical history significant of HTN, anxiety, HOH, gastric ulcer, vertigo, anemia, insomnia, who presents with fall and right hip pain.  S/P right hip hemiarthroplasty.    PT Comments    Participated in exercises as described below.  To EOB with time and min assist.  Stood for an extended time at bedside about 2 minutes for nursing care.  Needed to sit due to fatigue.  He is able to transfer with increased assist mod a x 2 to recliner after short rest.  Decreased quality due to fatigue but wanting to get up.  Needs met and nurse remained in room for interventions.   Follow Up Recommendations  SNF;Supervision/Assistance - 24 hour     Equipment Recommendations  Rolling walker with 5" wheels    Recommendations for Other Services       Precautions / Restrictions Precautions Precautions: Fall;Posterior Hip Precaution Booklet Issued: No Restrictions Weight Bearing Restrictions: No RLE Weight Bearing: Weight bearing as tolerated    Mobility  Bed Mobility Overal bed mobility: Needs Assistance Bed Mobility: Supine to Sit     Supine to sit: Min assist          Transfers Overall transfer level: Needs assistance Equipment used: Rolling walker (2 wheeled) Transfers: Sit to/from Stand Sit to Stand: Min assist;Mod assist            Ambulation/Gait Ambulation/Gait assistance: Min assist;+2 physical assistance;Mod assist Gait Distance (Feet): 3 Feet Assistive device: Rolling walker (2 wheeled) Gait Pattern/deviations: Decreased step length - right;Decreased step length - left;Decreased dorsiflexion - right;Decreased dorsiflexion - left;Decreased stride length;Decreased weight shift to right;Decreased weight shift to left;Trunk flexed Gait velocity: decreased   General Gait Details:  several steps to recliner at bedside   Stairs             Wheelchair Mobility    Modified Rankin (Stroke Patients Only)       Balance Overall balance assessment: Needs assistance Sitting-balance support: Feet supported Sitting balance-Leahy Scale: Good     Standing balance support: Bilateral upper extremity supported;During functional activity   Standing balance comment: does well for standing from nursing care but fatigued needing increased assist to get to recliner due to fatigue.                            Cognition Arousal/Alertness: Awake/alert Behavior During Therapy: WFL for tasks assessed/performed Overall Cognitive Status: Within Functional Limits for tasks assessed                                        Exercises Total Joint Exercises Quad Sets: AROM;Right;Strengthening;10 reps;Supine Short Arc Quad: AAROM;Strengthening;Right;10 reps;Supine Heel Slides: AAROM;Strengthening;Right;10 reps;Supine Hip ABduction/ADduction: AAROM;Right;10 reps;Supine (hip abduction slides) Straight Leg Raises: AAROM;Right;10 reps;Supine    General Comments        Pertinent Vitals/Pain Pain Assessment: 0-10 Pain Score: 2  Pain Location: R LE with movement Pain Descriptors / Indicators: Grimacing;Sore;Guarding Pain Intervention(s): Limited activity within patient's tolerance;Monitored during session;Repositioned    Home Living                      Prior Function  PT Goals (current goals can now be found in the care plan section) Progress towards PT goals: Progressing toward goals    Frequency    BID      PT Plan Current plan remains appropriate    Co-evaluation              AM-PAC PT "6 Clicks" Mobility   Outcome Measure  Help needed turning from your back to your side while in a flat bed without using bedrails?: A Lot Help needed moving from lying on your back to sitting on the side of a flat bed  without using bedrails?: A Lot Help needed moving to and from a bed to a chair (including a wheelchair)?: A Lot Help needed standing up from a chair using your arms (e.g., wheelchair or bedside chair)?: A Lot Help needed to walk in hospital room?: A Lot Help needed climbing 3-5 steps with a railing? : Total 6 Click Score: 11    End of Session Equipment Utilized During Treatment: Gait belt Activity Tolerance: Patient limited by pain;Patient limited by fatigue Patient left: with bed alarm set;with call bell/phone within reach;in bed;with family/visitor present;with SCD's reapplied Nurse Communication: Mobility status PT Visit Diagnosis: Unsteadiness on feet (R26.81);History of falling (Z91.81);Muscle weakness (generalized) (M62.81);Other abnormalities of gait and mobility (R26.89);Difficulty in walking, not elsewhere classified (R26.2);Pain Pain - Right/Left: Right Pain - part of body: Leg;Hip     Time: 6979-4801 PT Time Calculation (min) (ACUTE ONLY): 16 min  Charges:  $Therapeutic Exercise: 8-22 mins                    Chesley Noon, PTA 12/03/20, 12:26 PM 12/03/2020, 12:24 PM

## 2020-12-03 NOTE — Progress Notes (Signed)
Physical Therapy Treatment Patient Details Name: Jacob Moses MRN: [id number] DOB: 1927-07-07 Today's Date: 12/03/2020    History of Present Illness Jacob Moses is a 87 y.o. male with medical history significant of HTN, anxiety, HOH, gastric ulcer, vertigo, anemia, insomnia, who presents with fall and right hip pain.  S/P right hip hemiarthroplasty.    PT Comments    Late entry 01/02/21  Pt ready for session.  Participated in exercises as described below. To EOB with mod a x 1.  Steady in sitting. Voices fear of falling but is encouraged to try.  He stands with mod a x 1 to RW and after a few minutes to obtain balance and comfort, he is able to take several steps to recliner at bedside.  Remains in chair with needs met.   Follow Up Recommendations  SNF;Supervision/Assistance - 24 hour     Equipment Recommendations  Rolling walker with 5" wheels    Recommendations for Other Services       Precautions / Restrictions Precautions Precautions: Fall;Posterior Hip Precaution Booklet Issued: No Restrictions Weight Bearing Restrictions: No RLE Weight Bearing: Weight bearing as tolerated    Mobility  Bed Mobility Overal bed mobility: Needs Assistance Bed Mobility: Supine to Sit     Supine to sit: Mod assist          Transfers Overall transfer level: Needs assistance Equipment used: Rolling walker (2 wheeled) Transfers: Sit to/from Stand Sit to Stand: Mod assist            Ambulation/Gait Ambulation/Gait assistance: Mod assist Gait Distance (Feet): 3 Feet Assistive device: Rolling walker (2 wheeled) Gait Pattern/deviations: Decreased step length - right;Decreased step length - left;Decreased dorsiflexion - right;Decreased dorsiflexion - left;Decreased stride length;Decreased weight shift to right;Decreased weight shift to left;Trunk flexed Gait velocity: decreased   General Gait Details: several steps to recliner at bedside   Stairs              Wheelchair Mobility    Modified Rankin (Stroke Patients Only)       Balance Overall balance assessment: Needs assistance Sitting-balance support: Feet supported Sitting balance-Leahy Scale: Good     Standing balance support: Bilateral upper extremity supported;During functional activity   Standing balance comment: less post lean today but still requires very close assist for safety, pt voices fear and anxiety over falling.                            Cognition Arousal/Alertness: Awake/alert Behavior During Therapy: WFL for tasks assessed/performed Overall Cognitive Status: Within Functional Limits for tasks assessed                                        Exercises Total Joint Exercises Quad Sets: AROM;Right;Strengthening;10 reps;Supine Short Arc Quad: AAROM;Strengthening;Right;10 reps;Supine Heel Slides: AAROM;Strengthening;Right;10 reps;Supine Hip ABduction/ADduction: AAROM;Right;10 reps;Supine (hip abduction slides) Straight Leg Raises: AAROM;Right;10 reps;Supine    General Comments        Pertinent Vitals/Pain Pain Assessment: Faces Faces Pain Scale: Hurts little more Pain Location: R LE with movement Pain Descriptors / Indicators: Grimacing;Sore;Guarding Pain Intervention(s): Limited activity within patient's tolerance;Monitored during session;Repositioned    Home Living                      Prior Function  PT Goals (current goals can now be found in the care plan section) Progress towards PT goals: Progressing toward goals    Frequency    BID      PT Plan Current plan remains appropriate    Co-evaluation              AM-PAC PT "6 Clicks" Mobility   Outcome Measure  Help needed turning from your back to your side while in a flat bed without using bedrails?: A Lot Help needed moving from lying on your back to sitting on the side of a flat bed without using bedrails?: A Lot Help needed  moving to and from a bed to a chair (including a wheelchair)?: A Lot Help needed standing up from a chair using your arms (e.g., wheelchair or bedside chair)?: A Lot Help needed to walk in hospital room?: A Lot Help needed climbing 3-5 steps with a railing? : Total 6 Click Score: 11    End of Session Equipment Utilized During Treatment: Gait belt Activity Tolerance: Patient limited by pain;Patient limited by fatigue Patient left: with bed alarm set;with call bell/phone within reach;in bed;with family/visitor present;with SCD's reapplied Nurse Communication: Mobility status PT Visit Diagnosis: Unsteadiness on feet (R26.81);History of falling (Z91.81);Muscle weakness (generalized) (M62.81);Other abnormalities of gait and mobility (R26.89);Difficulty in walking, not elsewhere classified (R26.2);Pain Pain - Right/Left: Right Pain - part of body: Leg;Hip     Time:  - 11:16-11:29    Charges:    1 unit exercise                   Chesley Noon, PTA 12/03/20, 8:18 AM  Chesley Noon 12/03/2020, 8:17 AM

## 2020-12-03 NOTE — Progress Notes (Signed)
  Subjective: 4 Days Post-Op Procedure(s) (LRB): ARTHROPLASTY BIPOLAR HIP (HEMIARTHROPLASTY)-Extra Scrub (Right) Patient reports pain as well-controlled.   Patient is doing well with his hip.  He is complaining about lower abdominal pain.  He had a small bowel movement last night and is to receive MiraLAX this morning.  He slept better. Plan is to go Skilled nursing facility after hospital stay. Negative for chest pain and shortness of breath Fever: no Gastrointestinal: negative for nausea and vomiting.    Objective: Vital signs in last 24 hours: Temp:  [97.8 F (36.6 C)-98.8 F (37.1 C)] 98.2 F (36.8 C) (08/01 0418) Pulse Rate:  [49-78] 67 (08/01 0418) Resp:  [18-20] 18 (08/01 0418) BP: (117-143)/(62-76) 143/76 (08/01 0418) SpO2:  [92 %-99 %] 99 % (08/01 0418)  Intake/Output from previous day:  Intake/Output Summary (Last 24 hours) at 12/03/2020 0700 Last data filed at 12/03/2020 0420 Gross per 24 hour  Intake 971.83 ml  Output 700 ml  Net 271.83 ml    Intake/Output this shift: Total I/O In: -  Out: 400 [Urine:400]  Labs: Recent Labs    12/01/20 0414 12/02/20 0616 12/03/20 0423  HGB 10.2* 10.1* 10.3*   Recent Labs    12/02/20 0616 12/03/20 0423  WBC 12.9* 13.2*  RBC 3.12* 3.24*  HCT 30.6* 31.6*  PLT 248 264   Recent Labs    12/02/20 0616 12/03/20 0423  NA 132* 131*  K 4.4 4.4  CL 97* 96*  CO2 29 28  BUN 32* 35*  CREATININE 1.51* 1.58*  GLUCOSE 115* 117*  CALCIUM 8.2* 8.3*   No results for input(s): LABPT, INR in the last 72 hours.   EXAM General - Patient is Alert, Appropriate, and Oriented Extremity - Neurovascular intact Dorsiflexion/Plantar flexion intact Compartment soft Dressing/Incision -clean, dry, no drainage Motor Function - intact, moving foot and toes well on exam.    Assessment/Plan: 4 Days Post-Op Procedure(s) (LRB): ARTHROPLASTY BIPOLAR HIP (HEMIARTHROPLASTY)-Extra Scrub (Right) Principal Problem:   Closed right hip fracture  (HCC) Active Problems:   Fall at home, initial encounter   Bradycardia   HTN (hypertension)   Anxiety   CKD (chronic kidney disease), stage IIIb   Iron deficiency anemia  Estimated body mass index is 19.11 kg/m as calculated from the following:   Height as of this encounter: '5\' 11"'$  (1.803 m).   Weight as of this encounter: 62.1 kg. Advance diet Up with therapy Discharge to SNF   Maintain posterior hip precautions. Precautions reviewed with patient.     DVT Prophylaxis - Lovenox, Ted hose, and SCDs Weight-Bearing as tolerated to right leg  Reche Dixon, PA-C [facility] 12/03/2020, 7:00 AM

## 2020-12-03 NOTE — Discharge Summary (Signed)
Physician Discharge Summary  RUSHTON DIVAN [id number] DOB: [birth year] DOA: 11/29/2020  PCP: Albina Billet, MD  Admit date: 11/29/2020 Discharge date: 12/03/2020  Admitted From: home  Disposition:  SNF  Recommendations for Outpatient Follow-up:  Follow up with PCP in 1-2 weeks F/u w/ ortho surg, Dr. Leim Fabry, in 2 weeks  Home Health: no Equipment/Devices:  Discharge Condition: stable  CODE STATUS: full  Diet recommendation: Regular   Brief/Interim Summary: HPI was taken from Dr. Blaine Hamper: Jacob Moses is a 87 y.o. male with medical history significant of HTN, anxiety, HOH, gastric ulcer, vertigo, anemia, insomnia, who presents with fall and right hip pain.    Per pt's daughter at bedside, pt fell when he was bending forward to pick up something on the ground and lost balance in this AM. No LOC.  Patient is not very sure if he injured his head or neck.  He fell onto his right hip. He developed pain in right hip, which is constant, sharp, severe, throbbing, nonradiating.  Patient does not have numbness or tingling in extremities.  No facial droop or slurred speech. Patient does not have chest pain, cough, shortness breath.  No nausea, vomiting, diarrhea or abdominal pain.  Patient is mildly constipated.   ED Course: pt was found to have WBC 8.2, troponin level 9, pending COVID-19 test, renal function close to baseline, temperature normal, blood pressure 187/85, bradycardia with heart rate 40-50s, RR 16, oxygen saturation 100% on room air.  Chest x-ray negative for infiltration.  X-ray of right hip/pelvis that showed comminuted right femoral neck fracture.  Patient is admitted to [facility] bed as inpatient.  Dr. Posey Pronto of Ortho and Dr. Ubaldo Glassing of cardiology are consulted   Hospital course from Dr. Jimmye Norman 7/29-12/03/20: Pt presented after a fall at home and was found to have closed right hip fracture. Pt is s/p right hip hemiarthroplasty. Ortho surg recommended lovenox, ted hose & SCDs for DVT  prophylaxis. PT/OT evaluated the pt and recommended SNF. Of note, pt was found to hyponatremia of unknown etiology that did not improve w/ IVFs, would recommend fluid restriction 1247m and recheck Na level.  Discharge Diagnoses:  Principal Problem:   Closed right hip fracture ([facility] Active Problems:   Fall at home, initial encounter   Bradycardia   HTN (hypertension)   Anxiety   CKD (chronic kidney disease), stage IIIb   Iron deficiency anemia  Closed right hip fracture: secondary to fall at home. S/p right hip hemiarthroplasty. Oxycodone, morphine prn for pain. Ortho surg following and recs apprec. PT/OT recs SNF. Will order 2nd CDuboisbooster today as this is needed to go to SNF   Hyponatremia: stop IVFs. Fluid restriction to 1200 ml x 2 days and recheck Na level   Hyperkalemia: resolved   Leukocytosis: continues to trend down, likely reactive  Bradycardia: asymptomatic.  No chest pain or shortness of breath.    HTN: continue on amlodipine   Anxiety: severity unknown. Continue on home dose of xanax  CKDIIIb: baseline Cr 1.8-1.9. Cr is labile. Avoid nephrotoxic meds    Iron deficiency anemia: H&H are stable. Not taking any iron supplements at home   Discharge Instructions  Discharge Instructions     Diet general   Complete by: As directed    Discharge instructions   Complete by: As directed    F/u w/ PCP in 1-2 weeks. F/u w/ ortho surg., Dr. SSerita Grit in 2 weeks.   Increase activity slowly   Complete by: As directed  No wound care   Complete by: As directed       Allergies as of 12/03/2020   No Known Allergies      Medication List     TAKE these medications    ALPRAZolam 0.5 MG 24 hr tablet Commonly known as: XANAX XR Take 0.5 mg by mouth daily.   amLODipine 5 MG tablet Commonly known as: NORVASC Take 1 tablet (5 mg total) by mouth daily.   enoxaparin 40 MG/0.4ML injection Commonly known as: LOVENOX Inject 0.4 mLs (40 mg total) into the skin daily  for 14 days.   multivitamin with minerals tablet Take 1 tablet by mouth daily.   ondansetron 4 MG tablet Commonly known as: ZOFRAN Take 1 tablet (4 mg total) by mouth every 6 (six) hours as needed for nausea.   oxyCODONE 5 MG immediate release tablet Commonly known as: Oxy IR/ROXICODONE Take 0.5-1 tablets (2.5-5 mg total) by mouth every 4 (four) hours as needed for moderate pain (pain score 4-6).   traMADol 50 MG tablet Commonly known as: ULTRAM Take 1 tablet (50 mg total) by mouth every 6 (six) hours as needed for moderate pain.   traZODone 50 MG tablet Commonly known as: DESYREL Take 0.5 tablets (25 mg total) by mouth at bedtime as needed for sleep.        Contact information for follow-up providers     [facility] [place], [place]. Go on 12/19/2020.   Specialty: Orthopedic Surgery Why: For staple removal and right hip x-rays;  Appt @ 8:30 am Contact information: [address] [place] [address] [phone]              Contact information for after-discharge care     [facility] .   Service: Skilled Nursing Contact information: [place] [phone]                    No Known Allergies  Consultations: Ortho surg, Dr. Serita Grit   Procedures/Studies: CT Head Wo Contrast  Result Date: 11/29/2020 CLINICAL DATA:  Neck trauma (Age >= 65y); Head trauma, minor (Age >= 65y) EXAM: CT HEAD WITHOUT CONTRAST CT CERVICAL SPINE WITHOUT CONTRAST TECHNIQUE: Multidetector CT imaging of the head and cervical spine was performed following the standard protocol without intravenous contrast. Multiplanar CT image reconstructions of the cervical spine were also generated. COMPARISON:  12/30/2019. FINDINGS: CT HEAD FINDINGS Brain: In comparison to 12/30/2019, no substantial change in the size of a 12 mm mixed density  left subdural collection. There is a small amount of hyperdensity within the collection which could represent recent hemorrhage. No significant mass effect or midline shift. No clearly new hemorrhage. No evidence of acute large vascular territory infarct. Similar patchy white matter hypoattenuation, most likely related to chronic microvascular ischemic disease. No hydrocephalus. No mass lesion. Vascular: No hyperdense vessel identified. Calcific intracranial atherosclerosis. Skull: No acute fracture. Sinuses/Orbits: Polyp or retention cyst in the inferior left maxillary sinus. Mild ethmoid air cell mucosal thickening. Otherwise, sinuses are clear. Unremarkable orbits. Other: No mastoid effusions. CT CERVICAL SPINE FINDINGS Alignment: Similar alignment. Similar mild reversal the cervical lordosis. Similar slight widening of the C2-C3 disc space anteriorly. No substantial sagittal subluxation. Mild broad dextrocurvature. Skull base and vertebrae: Similar appearance of extensive ankylosis throughout the cervical spine from C2 inferiorly. No evidence of acute fracture. Vertebral body heights are similar to  prior. Diffuse osteopenia. Soft tissues and spinal canal: No prevertebral fluid or swelling. No visible canal hematoma. Disc levels: Similar partially calcified central disc protrusion at C2-C3. Multilevel facet and uncovertebral hypertrophy. Multi level posterior osteophytic spurring which narrows the canal without evidence of high-grade bony canal stenosis. Upper chest: Biapical pleuroparenchymal scarring. Otherwise, visualized lung apices are clear. IMPRESSION: CT Head: 1. In comparison to 12/30/2019, no substantial change in size of a 12 mm mixed density left subdural collection. There is a small amount of hyperdensity within the collection, which may represent recent hemorrhage. No significant mass effect or midline shift. 2. Otherwise, no new acute abnormality. CT Cervical Spine: 1. No evidence of acute fracture  or traumatic malalignment. 2. Similar appearance of extensive ankylosis throughout the cervical spine from C2 inferiorly. 3. Diffuse osteopenia. Electronically Signed   By: Margaretha Sheffield MD   On: 11/29/2020 11:40   CT Cervical Spine Wo Contrast  Result Date: 11/29/2020 CLINICAL DATA:  Neck trauma (Age >= 65y); Head trauma, minor (Age >= 65y) EXAM: CT HEAD WITHOUT CONTRAST CT CERVICAL SPINE WITHOUT CONTRAST TECHNIQUE: Multidetector CT imaging of the head and cervical spine was performed following the standard protocol without intravenous contrast. Multiplanar CT image reconstructions of the cervical spine were also generated. COMPARISON:  12/30/2019. FINDINGS: CT HEAD FINDINGS Brain: In comparison to 12/30/2019, no substantial change in the size of a 12 mm mixed density left subdural collection. There is a small amount of hyperdensity within the collection which could represent recent hemorrhage. No significant mass effect or midline shift. No clearly new hemorrhage. No evidence of acute large vascular territory infarct. Similar patchy white matter hypoattenuation, most likely related to chronic microvascular ischemic disease. No hydrocephalus. No mass lesion. Vascular: No hyperdense vessel identified. Calcific intracranial atherosclerosis. Skull: No acute fracture. Sinuses/Orbits: Polyp or retention cyst in the inferior left maxillary sinus. Mild ethmoid air cell mucosal thickening. Otherwise, sinuses are clear. Unremarkable orbits. Other: No mastoid effusions. CT CERVICAL SPINE FINDINGS Alignment: Similar alignment. Similar mild reversal the cervical lordosis. Similar slight widening of the C2-C3 disc space anteriorly. No substantial sagittal subluxation. Mild broad dextrocurvature. Skull base and vertebrae: Similar appearance of extensive ankylosis throughout the cervical spine from C2 inferiorly. No evidence of acute fracture. Vertebral body heights are similar to prior. Diffuse osteopenia. Soft tissues  and spinal canal: No prevertebral fluid or swelling. No visible canal hematoma. Disc levels: Similar partially calcified central disc protrusion at C2-C3. Multilevel facet and uncovertebral hypertrophy. Multi level posterior osteophytic spurring which narrows the canal without evidence of high-grade bony canal stenosis. Upper chest: Biapical pleuroparenchymal scarring. Otherwise, visualized lung apices are clear. IMPRESSION: CT Head: 1. In comparison to 12/30/2019, no substantial change in size of a 12 mm mixed density left subdural collection. There is a small amount of hyperdensity within the collection, which may represent recent hemorrhage. No significant mass effect or midline shift. 2. Otherwise, no new acute abnormality. CT Cervical Spine: 1. No evidence of acute fracture or traumatic malalignment. 2. Similar appearance of extensive ankylosis throughout the cervical spine from C2 inferiorly. 3. Diffuse osteopenia. Electronically Signed   By: Margaretha Sheffield MD   On: 11/29/2020 11:40   DG Pelvis Portable  Result Date: 11/29/2020 CLINICAL DATA:  Postop right hip hemiarthroplasty. EXAM: PORTABLE PELVIS 1-2 VIEWS COMPARISON:  Preoperative radiograph earlier today. FINDINGS: Right hip hemiarthroplasty in expected alignment. No periprosthetic lucency. Recent postsurgical change includes air and edema in the soft tissues. Lateral skin staples in place. IMPRESSION: Right hip hemiarthroplasty  without immediate postoperative complication. Electronically Signed   By: Keith Rake M.D.   On: 11/29/2020 17:09   DG Pelvis Portable  Result Date: 11/29/2020 CLINICAL DATA:  Right hip replacement. EXAM: PORTABLE PELVIS 1-2 VIEWS COMPARISON:  Same day. FINDINGS: Two intraoperative images were obtained of the right hip. The femoral prosthesis is noted. Expected postoperative changes are seen in the surrounding soft tissues. IMPRESSION: Femoral prosthesis is noted. Electronically Signed   By: Marijo Conception M.D.    On: 11/29/2020 15:47   DG Pelvis Portable  Result Date: 11/29/2020 CLINICAL DATA:  Closed right hip fracture EXAM: PORTABLE PELVIS 1-2 VIEWS COMPARISON:  None. FINDINGS: There is a right femoral neck fracture with varus angulation. No subluxation or dislocation. Hip joints are symmetric. IMPRESSION: Right femoral neck fracture with varus angulation. Electronically Signed   By: Rolm Baptise M.D.   On: 11/29/2020 10:17   DG Chest Port 1 View  Result Date: 11/29/2020 CLINICAL DATA:  87 year old male status post fall at home. Right femoral neck fracture. EXAM: PORTABLE CHEST 1 VIEW COMPARISON:  None. FINDINGS: Portable AP view at 0845 hours. Low lung volumes. Mild cardiomegaly. Other mediastinal contours are within normal limits. Visualized tracheal air column is within normal limits. Allowing for portable technique the lungs are clear. No pneumothorax or pleural effusion is evident. No acute osseous abnormality identified. IMPRESSION: Low lung volumes.  No acute cardiopulmonary abnormality. Electronically Signed   By: Genevie Ann M.D.   On: 11/29/2020 09:15   DG Hip Unilat  With Pelvis 2-3 Views Right  Result Date: 11/29/2020 CLINICAL DATA:  Fall. EXAM: DG HIP (WITH OR WITHOUT PELVIS) 2-3V RIGHT COMPARISON:  No prior. FINDINGS: Diffuse severe osteopenia and degenerative change. Comminuted right femoral neck fracture with angulation deformity noted. No evidence of dislocation. Pelvic calcifications consistent phleboliths. Dystrophic calcifications noted over the pelvis. IMPRESSION: 1. Comminuted right femoral neck fracture with angulation deformity. 2.  Severe diffuse osteopenia.  Diffuse degenerative change. Electronically Signed   By: Marcello Moores  Register   On: 11/29/2020 09:02   (Echo, Carotid, EGD, Colonoscopy, ERCP)    Subjective: pt c/o intermittent nausea    Discharge Exam: Vitals:   12/03/20 0418 12/03/20 0751  BP: (!) 143/76 131/69  Pulse: 67 66  Resp: 18 16  Temp: 98.2 F (36.8 C) 98.3 F  (36.8 C)  SpO2: 99% 96%   Vitals:   12/02/20 1659 12/02/20 2135 12/03/20 0418 12/03/20 0751  BP: (!) 143/72 117/62 (!) 143/76 131/69  Pulse: 78 65 67 66  Resp: '18 18 18 16  '$ Temp: 97.8 F (36.6 C) 98.8 F (37.1 C) 98.2 F (36.8 C) 98.3 F (36.8 C)  TempSrc: Oral     SpO2: 95% 96% 99% 96%  Weight:      Height:        General: Pt is alert, awake, not in acute distress Cardiovascular: S1/S2 +, no rubs, no gallops Respiratory: CTA bilaterally, no wheezing, no rhonchi Abdominal: Soft, NT, ND, bowel sounds + Extremities:  no cyanosis    The results of significant diagnostics from this hospitalization (including imaging, microbiology, ancillary and laboratory) are listed below for reference.     Microbiology: Recent Results (from the past 240 hour(s))  Resp Panel by RT-PCR (Flu A&B, Covid) Nasopharyngeal Swab     Status: None   Collection Time: 11/29/20  9:19 AM   Specimen: Nasopharyngeal Swab; Nasopharyngeal(NP) swabs in vial transport medium  Result Value Ref Range Status   SARS Coronavirus 2 by RT PCR  NEGATIVE NEGATIVE Final    Comment: (NOTE) SARS-CoV-2 target nucleic acids are NOT DETECTED.  The SARS-CoV-2 RNA is generally detectable in upper respiratory specimens during the acute phase of infection. The lowest concentration of SARS-CoV-2 viral copies this assay can detect is 138 copies/mL. A negative result does not preclude SARS-Cov-2 infection and should not be used as the sole basis for treatment or other patient management decisions. A negative result may occur with  improper specimen collection/handling, submission of specimen other than nasopharyngeal swab, presence of viral mutation(s) within the areas targeted by this assay, and inadequate number of viral copies(<138 copies/mL). A negative result must be combined with clinical observations, patient history, and epidemiological information. The expected result is Negative.  Fact Sheet for Patients:   EntrepreneurPulse.com.au  Fact Sheet for Healthcare Providers:  IncredibleEmployment.be  This test is no t yet approved or cleared by the [place] FDA and  has been authorized for detection and/or diagnosis of SARS-CoV-2 by FDA under an Emergency Use Authorization (EUA). This EUA will remain  in effect (meaning this test can be used) for the duration of the COVID-19 declaration under Section 564(b)(1) of the Act, 21 U.S.C.section 360bbb-3(b)(1), unless the authorization is terminated  or revoked sooner.       Influenza A by PCR NEGATIVE NEGATIVE Final   Influenza B by PCR NEGATIVE NEGATIVE Final    Comment: (NOTE) The Xpert Xpress SARS-CoV-2/FLU/RSV plus assay is intended as an aid in the diagnosis of influenza from Nasopharyngeal swab specimens and should not be used as a sole basis for treatment. Nasal washings and aspirates are unacceptable for Xpert Xpress SARS-CoV-2/FLU/RSV testing.  Fact Sheet for Patients: EntrepreneurPulse.com.au  Fact Sheet for Healthcare Providers: IncredibleEmployment.be  This test is not yet approved or cleared by the [place] FDA and has been authorized for detection and/or diagnosis of SARS-CoV-2 by FDA under an Emergency Use Authorization (EUA). This EUA will remain in effect (meaning this test can be used) for the duration of the COVID-19 declaration under Section 564(b)(1) of the Act, 21 U.S.C. section 360bbb-3(b)(1), unless the authorization is terminated or revoked.  Performed at [facility], [address] [place], [place] [address]      Labs: BNP (last 3 results) No results for input(s): BNP in the last 8760 hours. Basic Metabolic Panel: Recent Labs  Lab 11/29/20 0837 11/30/20 0613 11/30/20 1635 12/01/20 0414 12/01/20 1518 12/02/20 0616 12/03/20 0423  NA 136 134*  --  133*  --  132* 131*  K 4.7 5.6* 5.1 5.2* 4.5 4.4 4.4  CL 102  103  --  100  --  97* 96*  CO2 22 25  --  29  --  29 28  GLUCOSE 115* 158*  --  104*  --  115* 117*  BUN 31* 29*  --  28*  --  32* 35*  CREATININE 1.70* 1.52*  --  1.62*  --  1.51* 1.58*  CALCIUM 9.0 8.6*  --  8.5*  --  8.2* 8.3*   Liver Function Tests: No results for input(s): AST, ALT, ALKPHOS, BILITOT, PROT, ALBUMIN in the last 168 hours. No results for input(s): LIPASE, AMYLASE in the last 168 hours. No results for input(s): AMMONIA in the last 168 hours. CBC: Recent Labs  Lab 11/29/20 0837 11/30/20 0613 12/01/20 0414 12/02/20 0616 12/03/20 0423  WBC 8.2 19.6* 14.9* 12.9* 13.2*  NEUTROABS 5.8  --   --   --   --   HGB 11.7* 10.6* 10.2*  10.1* 10.3*  HCT 35.9* 31.7* 30.8* 30.6* 31.6*  MCV 100.0 98.8 99.0 98.1 97.5  PLT 269 268 238 248 264   Cardiac Enzymes: No results for input(s): CKTOTAL, CKMB, CKMBINDEX, TROPONINI in the last 168 hours. BNP: Invalid input(s): POCBNP CBG: No results for input(s): GLUCAP in the last 168 hours. D-Dimer No results for input(s): DDIMER in the last 72 hours. Hgb A1c No results for input(s): HGBA1C in the last 72 hours. Lipid Profile No results for input(s): CHOL, HDL, LDLCALC, TRIG, CHOLHDL, LDLDIRECT in the last 72 hours. Thyroid function studies No results for input(s): TSH, T4TOTAL, T3FREE, THYROIDAB in the last 72 hours.  Invalid input(s): FREET3 Anemia work up No results for input(s): VITAMINB12, FOLATE, FERRITIN, TIBC, IRON, RETICCTPCT in the last 72 hours. Urinalysis    Component Value Date/Time   COLORURINE YELLOW (A) 02/22/2019 1305   APPEARANCEUR HAZY (A) 02/22/2019 1305   LABSPEC 1.019 02/22/2019 1305   PHURINE 5.0 02/22/2019 1305   GLUCOSEU NEGATIVE 02/22/2019 1305   HGBUR LARGE (A) 02/22/2019 1305   BILIRUBINUR NEGATIVE 02/22/2019 1305   KETONESUR 5 (A) 02/22/2019 1305   PROTEINUR 100 (A) 02/22/2019 1305   NITRITE NEGATIVE 02/22/2019 1305   LEUKOCYTESUR NEGATIVE 02/22/2019 1305   Sepsis Labs Invalid input(s):  PROCALCITONIN,  WBC,  LACTICIDVEN Microbiology Recent Results (from the past 240 hour(s))  Resp Panel by RT-PCR (Flu A&B, Covid) Nasopharyngeal Swab     Status: None   Collection Time: 11/29/20  9:19 AM   Specimen: Nasopharyngeal Swab; Nasopharyngeal(NP) swabs in vial transport medium  Result Value Ref Range Status   SARS Coronavirus 2 by RT PCR NEGATIVE NEGATIVE Final    Comment: (NOTE) SARS-CoV-2 target nucleic acids are NOT DETECTED.  The SARS-CoV-2 RNA is generally detectable in upper respiratory specimens during the acute phase of infection. The lowest concentration of SARS-CoV-2 viral copies this assay can detect is 138 copies/mL. A negative result does not preclude SARS-Cov-2 infection and should not be used as the sole basis for treatment or other patient management decisions. A negative result may occur with  improper specimen collection/handling, submission of specimen other than nasopharyngeal swab, presence of viral mutation(s) within the areas targeted by this assay, and inadequate number of viral copies(<138 copies/mL). A negative result must be combined with clinical observations, patient history, and epidemiological information. The expected result is Negative.  Fact Sheet for Patients:  EntrepreneurPulse.com.au  Fact Sheet for Healthcare Providers:  IncredibleEmployment.be  This test is no t yet approved or cleared by the [place] FDA and  has been authorized for detection and/or diagnosis of SARS-CoV-2 by FDA under an Emergency Use Authorization (EUA). This EUA will remain  in effect (meaning this test can be used) for the duration of the COVID-19 declaration under Section 564(b)(1) of the Act, 21 U.S.C.section 360bbb-3(b)(1), unless the authorization is terminated  or revoked sooner.       Influenza A by PCR NEGATIVE NEGATIVE Final   Influenza B by PCR NEGATIVE NEGATIVE Final    Comment: (NOTE) The Xpert Xpress  SARS-CoV-2/FLU/RSV plus assay is intended as an aid in the diagnosis of influenza from Nasopharyngeal swab specimens and should not be used as a sole basis for treatment. Nasal washings and aspirates are unacceptable for Xpert Xpress SARS-CoV-2/FLU/RSV testing.  Fact Sheet for Patients: EntrepreneurPulse.com.au  Fact Sheet for Healthcare Providers: IncredibleEmployment.be  This test is not yet approved or cleared by the [place] FDA and has been authorized for detection and/or diagnosis of SARS-CoV-2 by FDA  under an Emergency Use Authorization (EUA). This EUA will remain in effect (meaning this test can be used) for the duration of the COVID-19 declaration under Section 564(b)(1) of the Act, 21 U.S.C. section 360bbb-3(b)(1), unless the authorization is terminated or revoked.  Performed at [facility], [address], [place], [place] [address]      Time coordinating discharge: Over 30 minutes  SIGNED:   Wyvonnia Dusky, MD  [facility] 12/03/2020, 1:53 PM Pager   If 7PM-7AM, please contact night-coverage

## 2020-12-04 ENCOUNTER — Encounter: Payer: Self-pay | Admitting: Orthopedic Surgery

## 2020-12-04 DIAGNOSIS — N320 Bladder-neck obstruction: Secondary | ICD-10-CM | POA: Insufficient documentation

## 2020-12-12 ENCOUNTER — Ambulatory Visit (INDEPENDENT_AMBULATORY_CARE_PROVIDER_SITE_OTHER): Payer: Medicare Other | Admitting: Urology

## 2020-12-12 ENCOUNTER — Encounter: Payer: Self-pay | Admitting: Urology

## 2020-12-12 ENCOUNTER — Other Ambulatory Visit: Payer: Self-pay

## 2020-12-12 ENCOUNTER — Ambulatory Visit (INDEPENDENT_AMBULATORY_CARE_PROVIDER_SITE_OTHER): Payer: Medicare Other | Admitting: Physician Assistant

## 2020-12-12 VITALS — BP 133/79 | HR 77 | Ht 71.0 in | Wt 130.0 lb

## 2020-12-12 DIAGNOSIS — N401 Enlarged prostate with lower urinary tract symptoms: Secondary | ICD-10-CM

## 2020-12-12 DIAGNOSIS — R338 Other retention of urine: Secondary | ICD-10-CM

## 2020-12-12 LAB — BLADDER SCAN AMB NON-IMAGING

## 2020-12-12 NOTE — Progress Notes (Signed)
12/12/2020 9:17 AM   Jacob Moses 1928-04-18 [id number]  Referring provider: Kirk Ruths, MD [facility] [place],  [place] [address]  Chief Complaint  Patient presents with   Urinary Retention    HPI: Jacob Moses is a 87 y.o. male referred for evaluation of urinary retention.  Hospitalized 11/29/2020 after a fall sustaining a right femoral neck fracture Underwent right hip hemiarthroplasty 11/29/2020 No mention of urinary retention during his hospitalization however discharged 12/03/2020 to rehab at [facility] the evening of 8/1 he was complaining of severe lower abdominal pain resolved with Foley catheter insertion.  Volume of urine obtained with Foley insertion unavailable Apparently he had not voided after Foley catheter was removed at [facility] and he had not had a bowel movement in several days Prior to his hospitalization he denied significant voiding patterns and had mild urinary frequency He did fall last night and is scheduled for x-rays back at [facility] today  PMH: Past Medical History:  Diagnosis Date   H/O vertigo    History of stomach ulcers    HOH (hard of hearing)    Hypertension    Insomnia    Wears dentures     Surgical History: Past Surgical History:  Procedure Laterality Date   APPENDECTOMY  1999   Dr.Byrnett   BELPHAROPTOSIS REPAIR  2010   CATARACT EXTRACTION W/PHACO Left 09/13/2019   Procedure: CATARACT EXTRACTION PHACO AND INTRAOCULAR LENS PLACEMENT (IOC) LEFT  5.06  00:48.8;  Surgeon: Birder Robson, MD;  Location: [place];  Service: Ophthalmology;  Laterality: Left;   CATARACT EXTRACTION W/PHACO Right 10/04/2019   Procedure: CATARACT EXTRACTION PHACO AND INTRAOCULAR LENS PLACEMENT ([place]) RIGHT;  Surgeon: Birder Robson, MD;  Location: [place];  Service: Ophthalmology;  Laterality: Right;  4.64 0:43.9   ECTROPION REPAIR Bilateral 04/24/2015   Procedure: REPAIR OF ECTROPION  TARSAL WEDGE LEFT EYE REPAIR EXTENSIVE BILATERAL;  Surgeon: Karle Starch, MD;  Location: [place];  Service: Ophthalmology;  Laterality: Bilateral;   HERNIA REPAIR  1999   Dr.Byrnett   HIP ARTHROPLASTY Right 11/29/2020   Procedure: ARTHROPLASTY BIPOLAR HIP (HEMIARTHROPLASTY)-Extra Scrub;  Surgeon: Leim Fabry, MD;  Location: [facility];  Service: Orthopedics;  Laterality: Right;   LACRIMAL DUCT EXPLORATION Bilateral 04/24/2015   Procedure: LACRIMAL DUCT EXPLORATION PUNCTOPLASTY;  Surgeon: Karle Starch, MD;  Location: [place];  Service: Ophthalmology;  Laterality: Bilateral;   SEPTOPLASTY     STOMACH SURGERY  1966   ulcer    Home Medications:  Allergies as of 12/12/2020   No Known Allergies      Medication List        Accurate as of December 12, 2020  9:17 AM. If you have any questions, ask your nurse or doctor.          ALPRAZolam 0.5 MG 24 hr tablet Commonly known as: XANAX XR Take 0.5 mg by mouth daily.   amLODipine 5 MG tablet Commonly known as: NORVASC Take 1 tablet (5 mg total) by mouth daily.   enoxaparin 40 MG/0.4ML injection Commonly known as: LOVENOX Inject 0.3 mLs (30 mg total) into the skin daily for 14 days.   multivitamin with minerals tablet Take 1 tablet by mouth daily.   ondansetron 4 MG tablet Commonly known as: ZOFRAN Take 1 tablet (4 mg total) by mouth every 6 (six) hours as needed for nausea.   oxyCODONE 5 MG immediate release tablet Commonly known as: Oxy IR/ROXICODONE  Take 0.5-1 tablets (2.5-5 mg total) by mouth every 4 (four) hours as needed for moderate pain (pain score 4-6).   traMADol 50 MG tablet Commonly known as: ULTRAM Take 1 tablet (50 mg total) by mouth every 6 (six) hours as needed for moderate pain.   traZODone 50 MG tablet Commonly known as: DESYREL Take 0.5 tablets (25 mg total) by mouth at bedtime as needed for sleep.        Allergies: No Known Allergies  Family History: Family History  Problem  Relation Age of Onset   Cancer Mother    Diabetes Father     Social History:  reports that he quit smoking about 42 years ago. His smoking use included cigarettes. He has never used smokeless tobacco. He reports that he does not drink alcohol and does not use drugs.   Physical Exam: BP 133/79   Pulse 77   Ht '5\' 11"'$  (1.803 m)   Wt 130 lb (59 kg)   BMI 18.13 kg/m   Constitutional:  Alert, no acute distress. HEENT: [place] AT, moist mucus membranes.  Trachea midline, no masses. Cardiovascular: No clubbing, cyanosis, or edema. Respiratory: Normal respiratory effort, no increased work of breathing. GU: Foley catheter draining brownish tinted translucent urine Skin: No rashes, bruises or suspicious lesions. Neurologic: Grossly intact, no focal deficits, moving all 4 extremities. Psychiatric: Normal mood and affect.   Assessment & Plan:    1.  Postop urinary retention Most likely secondary to BPH and exacerbated by constipation Foley catheter removed today and he is scheduled for follow-up bladder scan this afternoon Order for MiraLAX written at his SNF If he does have recurrent retention would place on low-dose silodosin 4 mg with follow-up voiding trial 7 days If he does have a successful voiding trial would recommend a 30-day follow-up with bladder scan   Abbie Sons, MD  [facility] [address] [place], [place] [address] [phone]

## 2020-12-12 NOTE — Progress Notes (Signed)
Afternoon follow-up  Patient returned to clinic this afternoon for repeat PVR. He reports drinking approximately 18oz of fluid. He does not think he has urinated, however he wears absorbent underwear and it is unclear if he is soiled these.  He denies lower abdominal discomfort or any acute concerns.  PVR 157m.  Results for orders placed or performed in visit on 12/12/20  Bladder Scan (Post Void Residual) in office  Result Value Ref Range   Scan Result 1153m    I suspect patient has had some urinary leakage today without awareness.  Voiding trial passed.  We will plan for repeat PVR in 4 weeks but counseled the patient to return to clinic sooner if he has any difficulty voiding.  He expressed understanding.  Follow up: Return in about 4 weeks (around 01/09/2021) for Repeat PVR.

## 2020-12-12 NOTE — Progress Notes (Signed)
Catheter Removal  Patient is present today for a catheter removal.  45m of water was drained from the balloon. A 16FR foley cath was removed from the bladder no complications were noted . Patient tolerated well.  Performed by: CElberta Leatherwood CMA  Follow up/ Additional notes: 2:30 today for PVR

## 2020-12-13 ENCOUNTER — Inpatient Hospital Stay
Admission: EM | Admit: 2020-12-13 | Discharge: 2020-12-18 | DRG: 698 | Disposition: A | Discharge status: Skilled Nursing Facility | Payer: Medicare Other | Source: Skilled Nursing Facility | Attending: Internal Medicine | Admitting: Internal Medicine

## 2020-12-13 ENCOUNTER — Emergency Department: Payer: Medicare Other

## 2020-12-13 ENCOUNTER — Other Ambulatory Visit: Payer: Self-pay

## 2020-12-13 DIAGNOSIS — Z961 Presence of intraocular lens: Secondary | ICD-10-CM | POA: Diagnosis present

## 2020-12-13 DIAGNOSIS — Z20822 Contact with and (suspected) exposure to covid-19: Secondary | ICD-10-CM | POA: Diagnosis present

## 2020-12-13 DIAGNOSIS — E860 Dehydration: Secondary | ICD-10-CM | POA: Diagnosis present

## 2020-12-13 DIAGNOSIS — R339 Retention of urine, unspecified: Secondary | ICD-10-CM

## 2020-12-13 DIAGNOSIS — R338 Other retention of urine: Secondary | ICD-10-CM | POA: Diagnosis present

## 2020-12-13 DIAGNOSIS — T83511A Infection and inflammatory reaction due to indwelling urethral catheter, initial encounter: Principal | ICD-10-CM | POA: Diagnosis present

## 2020-12-13 DIAGNOSIS — Z9841 Cataract extraction status, right eye: Secondary | ICD-10-CM

## 2020-12-13 DIAGNOSIS — A4159 Other Gram-negative sepsis: Secondary | ICD-10-CM | POA: Diagnosis present

## 2020-12-13 DIAGNOSIS — B9689 Other specified bacterial agents as the cause of diseases classified elsewhere: Secondary | ICD-10-CM | POA: Diagnosis present

## 2020-12-13 DIAGNOSIS — N136 Pyonephrosis: Secondary | ICD-10-CM | POA: Diagnosis present

## 2020-12-13 DIAGNOSIS — E875 Hyperkalemia: Secondary | ICD-10-CM | POA: Diagnosis present

## 2020-12-13 DIAGNOSIS — K5900 Constipation, unspecified: Secondary | ICD-10-CM | POA: Diagnosis present

## 2020-12-13 DIAGNOSIS — N1832 Chronic kidney disease, stage 3b: Secondary | ICD-10-CM | POA: Diagnosis present

## 2020-12-13 DIAGNOSIS — Z7901 Long term (current) use of anticoagulants: Secondary | ICD-10-CM

## 2020-12-13 DIAGNOSIS — Z87891 Personal history of nicotine dependence: Secondary | ICD-10-CM

## 2020-12-13 DIAGNOSIS — R509 Fever, unspecified: Secondary | ICD-10-CM

## 2020-12-13 DIAGNOSIS — Z96641 Presence of right artificial hip joint: Secondary | ICD-10-CM | POA: Diagnosis present

## 2020-12-13 DIAGNOSIS — Y846 Urinary catheterization as the cause of abnormal reaction of the patient, or of later complication, without mention of misadventure at the time of the procedure: Secondary | ICD-10-CM | POA: Diagnosis present

## 2020-12-13 DIAGNOSIS — N179 Acute kidney failure, unspecified: Secondary | ICD-10-CM | POA: Diagnosis present

## 2020-12-13 DIAGNOSIS — T8383XA Hemorrhage of genitourinary prosthetic devices, implants and grafts, initial encounter: Secondary | ICD-10-CM | POA: Diagnosis present

## 2020-12-13 DIAGNOSIS — A4150 Gram-negative sepsis, unspecified: Secondary | ICD-10-CM | POA: Diagnosis present

## 2020-12-13 DIAGNOSIS — I129 Hypertensive chronic kidney disease with stage 1 through stage 4 chronic kidney disease, or unspecified chronic kidney disease: Secondary | ICD-10-CM | POA: Diagnosis present

## 2020-12-13 DIAGNOSIS — A419 Sepsis, unspecified organism: Secondary | ICD-10-CM | POA: Diagnosis present

## 2020-12-13 DIAGNOSIS — R6520 Severe sepsis without septic shock: Secondary | ICD-10-CM | POA: Diagnosis present

## 2020-12-13 DIAGNOSIS — Z9842 Cataract extraction status, left eye: Secondary | ICD-10-CM

## 2020-12-13 DIAGNOSIS — E871 Hypo-osmolality and hyponatremia: Secondary | ICD-10-CM | POA: Diagnosis present

## 2020-12-13 DIAGNOSIS — Z79899 Other long term (current) drug therapy: Secondary | ICD-10-CM

## 2020-12-13 DIAGNOSIS — Z66 Do not resuscitate: Secondary | ICD-10-CM | POA: Diagnosis present

## 2020-12-13 DIAGNOSIS — Z8711 Personal history of peptic ulcer disease: Secondary | ICD-10-CM

## 2020-12-13 DIAGNOSIS — F0390 Unspecified dementia without behavioral disturbance: Secondary | ICD-10-CM | POA: Diagnosis present

## 2020-12-13 DIAGNOSIS — Z28310 Unvaccinated for covid-19: Secondary | ICD-10-CM

## 2020-12-13 DIAGNOSIS — N390 Urinary tract infection, site not specified: Secondary | ICD-10-CM

## 2020-12-13 DIAGNOSIS — N401 Enlarged prostate with lower urinary tract symptoms: Secondary | ICD-10-CM | POA: Diagnosis present

## 2020-12-13 DIAGNOSIS — E878 Other disorders of electrolyte and fluid balance, not elsewhere classified: Secondary | ICD-10-CM | POA: Diagnosis present

## 2020-12-13 DIAGNOSIS — H9190 Unspecified hearing loss, unspecified ear: Secondary | ICD-10-CM | POA: Diagnosis present

## 2020-12-13 MED ORDER — METRONIDAZOLE 500 MG/100ML IV SOLN
500.0000 mg | Freq: Once | INTRAVENOUS | Status: AC
  Administered 2020-12-14: 500 mg via INTRAVENOUS
  Filled 2020-12-13: qty 100

## 2020-12-13 MED ORDER — VANCOMYCIN HCL IN DEXTROSE 1-5 GM/200ML-% IV SOLN: 1000.0000 mg | Freq: Once | INTRAVENOUS | Status: DC

## 2020-12-13 MED ORDER — VANCOMYCIN HCL 1250 MG/250ML IV SOLN
1250.0000 mg | Freq: Once | INTRAVENOUS | Status: AC
  Administered 2020-12-14: 1250 mg via INTRAVENOUS
  Filled 2020-12-13: qty 250

## 2020-12-13 MED ORDER — SODIUM CHLORIDE 0.9 % IV SOLN
2.0000 g | Freq: Once | INTRAVENOUS | Status: AC
  Administered 2020-12-14: 2 g via INTRAVENOUS
  Filled 2020-12-13: qty 2

## 2020-12-13 MED ORDER — LACTATED RINGERS IV SOLN: INTRAVENOUS | Status: AC

## 2020-12-13 MED ORDER — LACTATED RINGERS IV BOLUS (SEPSIS)
1000.0000 mL | Freq: Once | INTRAVENOUS | Status: AC
  Administered 2020-12-14: 1000 mL via INTRAVENOUS

## 2020-12-13 MED ORDER — ACETAMINOPHEN 500 MG PO TABS
1000.0000 mg | ORAL_TABLET | Freq: Once | ORAL | Status: AC
  Administered 2020-12-13: 1000 mg via ORAL
  Filled 2020-12-13: qty 2

## 2020-12-13 NOTE — ED Provider Notes (Signed)
[facility] Emergency Department Provider Note  ____________________________________________   Event Date/Time   First MD Initiated Contact with Patient 12/13/20 2317     (approximate)  I have reviewed the triage vital signs and the nursing notes.   HISTORY  Chief Complaint Abdominal Pain    HPI Jacob Moses is a 87 y.o. male with history of dementia, hypertension, chronic kidney disease who presents to the emergency department with EMS from [place] facility with concerns for constipation.  Patient states his last bowel movement was approximately 10 days ago.  He denies nausea or vomiting.  Is complaining of lower abdominal pain.  Per EMS, nursing home staff also reported patient had urinary retention this morning and they placed a Foley catheter but have not had significant output since.  Patient feels warm to touch in the emergency department but EMS states he had a temperature of 98.2.  Rectal temperature here of 102.9.  On review of his records, it appears he recently was admitted for a fall and had a right hip replacement on 11/29/2020 by Dr. Posey Pronto.  Was discharged from the hospital on 12/03/2020.  It also appears that he had urinary retention while in the hospital thought secondary to BPH and constipation.  He was seen by urology Dr. Bernardo Heater yesterday and had his Foley catheter removed.    Attempted to get in touch with [facility] multiple times without any answer.    Per patient's paperwork from the nursing facility it appears he has dementia.  He appears to answer most questions appropriately but is very hard of hearing.  He denies any chest pain or shortness of breath.  Complaining of abdominal pain and rectal pain.  Denies nausea or vomiting.  States he thinks he has been passing gas.  He denies previous history of abdominal surgery but it appears he does have surgical incision to the upper abdomen and it looks like in his chart he  has history of of appendectomy, previous hernia repair.  Past Medical History:  Diagnosis Date   H/O vertigo    History of stomach ulcers    HOH (hard of hearing)    Hypertension    Insomnia    Wears dentures     Patient Active Problem List   Diagnosis Date Noted   Closed right hip fracture ([place]) 11/29/2020   Fall at home, initial encounter 11/29/2020   Bradycardia 11/29/2020   HTN (hypertension) 11/29/2020   GERD (gastroesophageal reflux disease) 11/29/2020   Anxiety 11/29/2020   CKD (chronic kidney disease), stage IIIb 11/29/2020   Iron deficiency anemia 11/29/2020   Protein-calorie malnutrition, severe 02/24/2019   SDH (subdural hematoma) ([place]) 02/22/2019    Past Surgical History:  Procedure Laterality Date   APPENDECTOMY  1999   Dr.Byrnett   BELPHAROPTOSIS REPAIR  2010   CATARACT EXTRACTION W/PHACO Left 09/13/2019   Procedure: CATARACT EXTRACTION PHACO AND INTRAOCULAR LENS PLACEMENT (IOC) LEFT  5.06  00:48.8;  Surgeon: Birder Robson, MD;  Location: [place];  Service: Ophthalmology;  Laterality: Left;   CATARACT EXTRACTION W/PHACO Right 10/04/2019   Procedure: CATARACT EXTRACTION PHACO AND INTRAOCULAR LENS PLACEMENT ([place]) RIGHT;  Surgeon: Birder Robson, MD;  [place];  Service: Ophthalmology;  Laterality: Right;  4.64 0:43.9   ECTROPION REPAIR Bilateral 04/24/2015   Procedure: REPAIR OF ECTROPION TARSAL WEDGE LEFT EYE REPAIR EXTENSIVE BILATERAL;  Surgeon: Karle Starch, MD;  Location: [place];  Service: Ophthalmology;  Laterality: Bilateral;  HERNIA REPAIR  1999   Dr.Byrnett   HIP ARTHROPLASTY Right 11/29/2020   Procedure: ARTHROPLASTY BIPOLAR HIP (HEMIARTHROPLASTY)-Extra Scrub;  Surgeon: Leim Fabry, MD;  Location: [facility];  Service: Orthopedics;  Laterality: Right;   LACRIMAL DUCT EXPLORATION Bilateral 04/24/2015   Procedure: LACRIMAL DUCT EXPLORATION PUNCTOPLASTY;  Surgeon: Karle Starch, MD;  Location: [place];  Service: Ophthalmology;  Laterality: Bilateral;   SEPTOPLASTY     STOMACH SURGERY  1966   ulcer    Prior to Admission medications   Medication Sig Start Date End Date Taking? Authorizing Provider  ALPRAZolam (XANAX XR) 0.5 MG 24 hr tablet Take 0.5 mg by mouth daily.    [provider]  amLODipine (NORVASC) 5 MG tablet Take 1 tablet (5 mg total) by mouth daily. 02/25/19   Demetrios Loll, MD  enoxaparin (LOVENOX) 40 MG/0.4ML injection Inject 0.3 mLs (30 mg total) into the skin daily for 14 days. 12/03/20 12/17/20  Wyvonnia Dusky, MD  Multiple Vitamins-Minerals (MULTIVITAMIN WITH MINERALS) tablet Take 1 tablet by mouth daily.    [provider]  ondansetron (ZOFRAN) 4 MG tablet Take 1 tablet (4 mg total) by mouth every 6 (six) hours as needed for nausea. 11/30/20   Reche Dixon, PA-C  oxyCODONE (OXY IR/ROXICODONE) 5 MG immediate release tablet Take 0.5-1 tablets (2.5-5 mg total) by mouth every 4 (four) hours as needed for moderate pain (pain score 4-6). 11/30/20   Reche Dixon, PA-C  traMADol (ULTRAM) 50 MG tablet Take 1 tablet (50 mg total) by mouth every 6 (six) hours as needed for moderate pain. 11/30/20   Reche Dixon, PA-C  traZODone (DESYREL) 50 MG tablet Take 0.5 tablets (25 mg total) by mouth at bedtime as needed for sleep. 02/25/19   Demetrios Loll, MD    Allergies Patient has no known allergies.  Family History  Problem Relation Age of Onset   Cancer Mother    Diabetes Father     Social History Social History   Tobacco Use   Smoking status: Former    Years: 30.00    Types: Cigarettes    Quit date: 05/05/1978    Years since quitting: 42.6   Smokeless tobacco: Never  Vaping Use   Vaping Use: Never used  Substance Use Topics   Alcohol use: No   Drug use: No    Review of Systems Level 5 caveat secondary to dementia  ____________________________________________   PHYSICAL EXAM:  VITAL SIGNS: ED Triage Vitals  Enc Vitals Group     BP 12/13/20 2325  (!) 172/84     Pulse Rate 12/13/20 2325 (!) 103     Resp 12/13/20 2325 19     Temp 12/13/20 2322 (!) 102.9 F (39.4 C)     Temp Source 12/13/20 2322 Rectal     SpO2 12/13/20 2325 100 %     Weight --      Height --      Head Circumference --      Peak Flow --      Pain Score --      Pain Loc --      Pain Edu? --      Excl. in GC? --    CONSTITUTIONAL: Alert and oriented x 3 and responds appropriately to questions.  Elderly, thin, appears uncomfortable HEAD: Normocephalic, appears atraumatic EYES: Conjunctivae clear, pupils appear equal, EOM appear intact ENT: normal nose; moist mucous membranes NECK: Supple, normal ROM CARD: Regular and minimally tachycardic, S1 and S2 appreciated; no  murmurs, no clicks, no rubs, no gallops RESP: Normal chest excursion without splinting or tachypnea; breath sounds clear and equal bilaterally; no wheezes, no rhonchi, no rales, no hypoxia or respiratory distress, speaking full sentences ABD/GI: Normal bowel sounds; slightly distended lower abdomen that is mildly tender to palpation without guarding, rebound GU: Foley catheter in place.  No bleeding or drainage from the urethral meatus.  Normal-appearing scrotum without swelling. RECTAL: Patient has a large amount of brown stool in the rectal vault with no gross blood or melena; stage II sacral decubitus ulcer without surrounding redness, warmth or drainage BACK: The back appears normal EXT: Normal ROM in all joints; no deformity noted, no edema; no cyanosis; surgical incision site is clean, dry and intact to the right lateral hip, there is no leg length discrepancy or deformity noted, 2+ DP pulses bilaterally SKIN: Normal color for age and race; warm; no rash on exposed skin NEURO: Moves all extremities equally PSYCH: The patient's mood and manner are appropriate.  ____________________________________________   LABS (all labs ordered are listed, but only abnormal results are displayed)  Labs  Reviewed  COMPREHENSIVE METABOLIC PANEL - Abnormal; Notable for the following components:      Result Value   Sodium 131 (*)    Potassium 5.3 (*)    Chloride 95 (*)    Glucose, Bld 147 (*)    BUN 34 (*)    Creatinine, Ser 1.61 (*)    Calcium 8.6 (*)    GFR, Estimated 40 (*)    All other components within normal limits  CBC WITH DIFFERENTIAL/PLATELET - Abnormal; Notable for the following components:   WBC 27.9 (*)    RBC 3.48 (*)    Hemoglobin 11.4 (*)    HCT 33.9 (*)    Platelets 747 (*)    Neutro Abs 25.2 (*)    Monocytes Absolute 1.6 (*)    Abs Immature Granulocytes 0.34 (*)    All other components within normal limits  PROTIME-INR - Abnormal; Notable for the following components:   Prothrombin Time 16.1 (*)    INR 1.3 (*)    All other components within normal limits  APTT - Abnormal; Notable for the following components:   aPTT 37 (*)    All other components within normal limits  URINALYSIS, COMPLETE (UACMP) WITH MICROSCOPIC - Abnormal; Notable for the following components:   Color, Urine AMBER (*)    APPearance CLOUDY (*)    Hgb urine dipstick LARGE (*)    Protein, ur 100 (*)    Nitrite POSITIVE (*)    Leukocytes,Ua LARGE (*)    RBC / HPF >50 (*)    WBC, UA >50 (*)    Bacteria, UA MANY (*)    All other components within normal limits  RESP PANEL BY RT-PCR (FLU A&B, COVID) ARPGX2  CULTURE, BLOOD (ROUTINE X 2)  CULTURE, BLOOD (ROUTINE X 2)  URINE CULTURE  LACTIC ACID, PLASMA  PROCALCITONIN   ____________________________________________  EKG   EKG Interpretation  Date/Time:  Friday December 14 2020 01:05:47 EDT Ventricular Rate:  87 PR Interval:  216 QRS Duration: 97 QT Interval:  374 QTC Calculation: 450 R Axis:   -77 Text Interpretation: Sinus rhythm Atrial premature complex Borderline prolonged PR interval Left anterior fascicular block Abnormal R-wave progression, early transition Nonspecific T abnrm, anterolateral leads ST elevation, consider inferior  injury Confirmed by Pryor Curia [phone]) on 12/14/2020 1:49:57 AM        ____________________________________________  RADIOLOGY Jessie Foot Lyna Laningham, personally  viewed and evaluated these images (plain radiographs) as part of my medical decision making, as well as reviewing the written report by the radiologist.  ED MD interpretation: Chest x-ray clear.  CT of the abdomen and pelvis shows mild rectal stool burden and mild bilateral hydroureteronephrosis.  Official radiology report(s): CT ABDOMEN PELVIS W CONTRAST  Result Date: 12/14/2020 CLINICAL DATA:  Abdominal pain, bloating EXAM: CT ABDOMEN AND PELVIS WITH CONTRAST TECHNIQUE: Multidetector CT imaging of the abdomen and pelvis was performed using the standard protocol following bolus administration of intravenous contrast. CONTRAST:  56m OMNIPAQUE IOHEXOL 350 MG/ML SOLN COMPARISON:  None. FINDINGS: Lower chest: Mild patchy bibasilar opacities, likely atelectasis. Hepatobiliary: Liver is within normal limits. Gallbladder is unremarkable. No intrahepatic or extrahepatic ductal dilatation. Pancreas: Within normal limits. Spleen: Within normal limits. Adrenals/Urinary Tract: Adrenal glands are within normal limits. 5.0 cm right lower pole renal cyst (series 2/image 35). Additional 2.0 cm right upper pole renal cyst (series 2/image 19). Left kidney is within normal limits. Mild bilateral hydroureteronephrosis. Bladder decompressed by an indwelling Foley catheter, poorly visualized due to streak artifact. Stomach/Bowel: Surgical clips at the GE junction. No evidence of bowel obstruction. Normal appendix (series 2/image 63). Mild rectal stool burden. Vascular/Lymphatic: No evidence of abdominal aortic aneurysm. Atherosclerotic calcifications of the abdominal aorta and branch vessels. No suspicious abdominopelvic lymphadenopathy. Reproductive: Prostate is unremarkable. Other: No abdominopelvic ascites. Musculoskeletal: Degenerative changes of the visualized  thoracolumbar spine. Right hip hemiarthroplasty. IMPRESSION: No evidence of bowel obstruction. Normal appendix. Mild rectal stool burden. Mild bilateral hydroureteronephrosis. This appearance is nonspecific but may be related to chronic bladder outlet obstruction. Bladder decompressed by an indwelling Foley catheter. Additional ancillary findings as above. Electronically Signed   By: SJulian HyM.D.   On: 12/14/2020 01:30   DG Chest Port 1 View  Result Date: 12/13/2020 CLINICAL DATA:  Bloating no bowel movement EXAM: PORTABLE CHEST 1 VIEW COMPARISON:  11/29/2020 FINDINGS: The heart size and mediastinal contours are within normal limits. Both lungs are clear. The visualized skeletal structures are unremarkable. IMPRESSION: No active disease. Electronically Signed   By: KDonavan FoilM.D.   On: 12/13/2020 23:35    ____________________________________________   PROCEDURES  Procedure(s) performed (including Critical Care):  Procedures  CRITICAL CARE Performed by: KCyril MourningWard   Total critical care time: 60 minutes  Critical care time was exclusive of separately billable procedures and treating other patients.  Critical care was necessary to treat or prevent imminent or life-threatening deterioration.  Critical care was time spent personally by me on the following activities: development of treatment plan with patient and/or surrogate as well as nursing, discussions with consultants, evaluation of patient's response to treatment, examination of patient, obtaining history from patient or surrogate, ordering and performing treatments and interventions, ordering and review of laboratory studies, ordering and review of radiographic studies, pulse oximetry and re-evaluation of patient's condition.  ____________________________________________   INITIAL IMPRESSION / ASSESSMENT AND PLAN / ED COURSE  As part of my medical decision making, I reviewed the following data within the [place] obtained from family, Nursing notes reviewed and incorporated, Labs reviewed , EKG interpreted , Old EKG reviewed, Radiograph reviewed , Discussed with admitting physician , and Notes from prior ED visits         Patient here with complaints of abdominal pain.  Abdomen appears distended.  Differential includes urinary retention, constipation, bowel obstruction, colitis, diverticulitis, UTI.  Also found to be febrile here.  No complaints of chest  pain, shortness of breath or cough.  Will obtain labs, urine, cultures.  Will obtain CT of the abdomen pelvis to include the right hip as well given recent right hip replacement.  No obvious signs of infection of this hip on exam.  No cellulitis.  ED PROGRESS  Daughter now at bedside.  She was not aware of any recent fevers.  Denies any cough, vomiting.  States he has had 2 COVID-19 vaccinations and 2 boosters.  States he has been dealing with constipation since being at the nursing facility.  States Foley catheter was placed this morning at the nursing home.  On bladder scan here he has greater than a liter in his bladder.  We have replaced the Foley catheter and he has drained approximately 2 L of blood-tinged urine.   Patient's urine appears grossly infected.  He has leukocytosis of 27,000.  Will admit for sepsis related to UTI.  Blood pressure has been stable.  Lactic, procalcitonin normal.  Will discuss with hospitalist.  He has received broad-spectrum antibiotics.  CT of the abdomen pelvis shows mild rectal stool burden.  Will give soapsuds enema.  No sign of bowel obstruction.  Hip replacement appears intact without signs of complication on CT imaging.  2:17 AM Discussed patient's case with hospitalist, Dr. Sidney Ace.  I have recommended admission and patient (and family if present) agree with this plan. Admitting physician will place admission orders.   I reviewed all nursing notes, vitals, pertinent previous records and  reviewed/interpreted all EKGs, lab and urine results, imaging (as available).  ____________________________________________   FINAL CLINICAL IMPRESSION(S) / ED DIAGNOSES  Final diagnoses:  Fever, unspecified fever cause  Urinary retention  Constipation, unspecified constipation type  Acute UTI     ED Discharge Orders     None       *Please note:  ORANGE GELIN was evaluated in Emergency Department on 12/14/2020 for the symptoms described in the history of present illness. He was evaluated in the context of the global COVID-19 pandemic, which necessitated consideration that the patient might be at risk for infection with the SARS-CoV-2 virus that causes COVID-19. Institutional protocols and algorithms that pertain to the evaluation of patients at risk for COVID-19 are in a state of rapid change based on information released by regulatory bodies including the CDC and federal and state organizations. These policies and algorithms were followed during the patient's care in the ED.  Some ED evaluations and interventions may be delayed as a result of limited staffing during and the pandemic.*   Note:  This document was prepared using Dragon voice recognition software and may include unintentional dictation errors.    Culley Hedeen, Delice Bison, DO 12/14/20 [phone]

## 2020-12-13 NOTE — ED Triage Notes (Signed)
Pt BIB by EMS from [facility] due to ABD bloating and not having a bowel movement in 10 days. Pt c/o having chills.

## 2020-12-13 NOTE — Progress Notes (Signed)
PHARMACY -  BRIEF ANTIBIOTIC NOTE   Pharmacy has received consult(s) for Cefepime and Vancomycin from an ED provider.  The patient's profile has been reviewed for ht/wt/allergies/indication/available labs.    One time order(s) placed for Cefepime 2 gm and Vancomycin 1250 mg per pt wt 59 kg.  Further antibiotics/pharmacy consults should be ordered by admitting physician if indicated.                       Thank you, Renda Rolls, PharmD, [facility] 12/13/2020 11:28 PM

## 2020-12-13 NOTE — Progress Notes (Signed)
CODE SEPSIS - PHARMACY COMMUNICATION  **Broad Spectrum Antibiotics should be administered within 1 hour of Sepsis diagnosis**  Time Code Sepsis Called/Page Received: 2319  Antibiotics Ordered: Cefepime and Vancomycin  Time of 1st antibiotic administration: [place], PharmD, [facility] 12/13/2020 11:27 PM

## 2020-12-13 NOTE — ED Notes (Signed)
xr at bedside

## 2020-12-14 ENCOUNTER — Emergency Department: Payer: Medicare Other

## 2020-12-14 ENCOUNTER — Encounter: Payer: Self-pay | Admitting: Radiology

## 2020-12-14 DIAGNOSIS — R339 Retention of urine, unspecified: Secondary | ICD-10-CM | POA: Diagnosis present

## 2020-12-14 DIAGNOSIS — H9190 Unspecified hearing loss, unspecified ear: Secondary | ICD-10-CM | POA: Diagnosis present

## 2020-12-14 DIAGNOSIS — R509 Fever, unspecified: Secondary | ICD-10-CM

## 2020-12-14 DIAGNOSIS — R338 Other retention of urine: Secondary | ICD-10-CM

## 2020-12-14 DIAGNOSIS — Z96641 Presence of right artificial hip joint: Secondary | ICD-10-CM | POA: Diagnosis present

## 2020-12-14 DIAGNOSIS — N1330 Unspecified hydronephrosis: Secondary | ICD-10-CM | POA: Diagnosis not present

## 2020-12-14 DIAGNOSIS — A4159 Other Gram-negative sepsis: Secondary | ICD-10-CM | POA: Diagnosis present

## 2020-12-14 DIAGNOSIS — F0390 Unspecified dementia without behavioral disturbance: Secondary | ICD-10-CM | POA: Diagnosis present

## 2020-12-14 DIAGNOSIS — N401 Enlarged prostate with lower urinary tract symptoms: Secondary | ICD-10-CM | POA: Diagnosis present

## 2020-12-14 DIAGNOSIS — R6520 Severe sepsis without septic shock: Secondary | ICD-10-CM | POA: Diagnosis present

## 2020-12-14 DIAGNOSIS — K5900 Constipation, unspecified: Secondary | ICD-10-CM | POA: Diagnosis present

## 2020-12-14 DIAGNOSIS — N139 Obstructive and reflux uropathy, unspecified: Secondary | ICD-10-CM

## 2020-12-14 DIAGNOSIS — Z87891 Personal history of nicotine dependence: Secondary | ICD-10-CM | POA: Diagnosis not present

## 2020-12-14 DIAGNOSIS — T83511A Infection and inflammatory reaction due to indwelling urethral catheter, initial encounter: Secondary | ICD-10-CM | POA: Diagnosis present

## 2020-12-14 DIAGNOSIS — E875 Hyperkalemia: Secondary | ICD-10-CM

## 2020-12-14 DIAGNOSIS — I10 Essential (primary) hypertension: Secondary | ICD-10-CM

## 2020-12-14 DIAGNOSIS — Y846 Urinary catheterization as the cause of abnormal reaction of the patient, or of later complication, without mention of misadventure at the time of the procedure: Secondary | ICD-10-CM | POA: Diagnosis present

## 2020-12-14 DIAGNOSIS — A4150 Gram-negative sepsis, unspecified: Secondary | ICD-10-CM

## 2020-12-14 DIAGNOSIS — Z20822 Contact with and (suspected) exposure to covid-19: Secondary | ICD-10-CM | POA: Diagnosis present

## 2020-12-14 DIAGNOSIS — N136 Pyonephrosis: Secondary | ICD-10-CM | POA: Diagnosis present

## 2020-12-14 DIAGNOSIS — E878 Other disorders of electrolyte and fluid balance, not elsewhere classified: Secondary | ICD-10-CM | POA: Diagnosis present

## 2020-12-14 DIAGNOSIS — N390 Urinary tract infection, site not specified: Secondary | ICD-10-CM | POA: Diagnosis not present

## 2020-12-14 DIAGNOSIS — E871 Hypo-osmolality and hyponatremia: Secondary | ICD-10-CM | POA: Diagnosis present

## 2020-12-14 DIAGNOSIS — Z28310 Unvaccinated for covid-19: Secondary | ICD-10-CM | POA: Diagnosis not present

## 2020-12-14 DIAGNOSIS — Z8711 Personal history of peptic ulcer disease: Secondary | ICD-10-CM | POA: Diagnosis not present

## 2020-12-14 DIAGNOSIS — A419 Sepsis, unspecified organism: Secondary | ICD-10-CM | POA: Diagnosis not present

## 2020-12-14 DIAGNOSIS — I129 Hypertensive chronic kidney disease with stage 1 through stage 4 chronic kidney disease, or unspecified chronic kidney disease: Secondary | ICD-10-CM | POA: Diagnosis present

## 2020-12-14 DIAGNOSIS — N189 Chronic kidney disease, unspecified: Secondary | ICD-10-CM

## 2020-12-14 DIAGNOSIS — N179 Acute kidney failure, unspecified: Secondary | ICD-10-CM

## 2020-12-14 DIAGNOSIS — B9689 Other specified bacterial agents as the cause of diseases classified elsewhere: Secondary | ICD-10-CM | POA: Diagnosis present

## 2020-12-14 DIAGNOSIS — E860 Dehydration: Secondary | ICD-10-CM | POA: Diagnosis present

## 2020-12-14 DIAGNOSIS — T8383XA Hemorrhage of genitourinary prosthetic devices, implants and grafts, initial encounter: Secondary | ICD-10-CM | POA: Diagnosis present

## 2020-12-14 DIAGNOSIS — N1832 Chronic kidney disease, stage 3b: Secondary | ICD-10-CM | POA: Diagnosis present

## 2020-12-14 DIAGNOSIS — R319 Hematuria, unspecified: Secondary | ICD-10-CM

## 2020-12-14 DIAGNOSIS — Z66 Do not resuscitate: Secondary | ICD-10-CM | POA: Diagnosis present

## 2020-12-14 LAB — COMPREHENSIVE METABOLIC PANEL
ALT: 11 U/L (ref 0–44)
ALT: 17 U/L (ref 0–44)
AST: 17 U/L (ref 15–41)
AST: 24 U/L (ref 15–41)
Albumin: 2.4 g/dL — ABNORMAL LOW (ref 3.5–5.0)
Albumin: 3.6 g/dL (ref 3.5–5.0)
Alkaline Phosphatase: 100 U/L (ref 38–126)
Alkaline Phosphatase: 67 U/L (ref 38–126)
Anion gap: 7 (ref 5–15)
Anion gap: 8 (ref 5–15)
BUN: 30 mg/dL — ABNORMAL HIGH (ref 8–23)
BUN: 34 mg/dL — ABNORMAL HIGH (ref 8–23)
CO2: 25 mmol/L (ref 22–32)
CO2: 28 mmol/L (ref 22–32)
Calcium: 7.8 mg/dL — ABNORMAL LOW (ref 8.9–10.3)
Calcium: 8.6 mg/dL — ABNORMAL LOW (ref 8.9–10.3)
Chloride: 100 mmol/L (ref 98–111)
Chloride: 95 mmol/L — ABNORMAL LOW (ref 98–111)
Creatinine, Ser: 1.42 mg/dL — ABNORMAL HIGH (ref 0.61–1.24)
Creatinine, Ser: 1.61 mg/dL — ABNORMAL HIGH (ref 0.61–1.24)
GFR, Estimated: 40 mL/min — ABNORMAL LOW (ref >60)
GFR, Estimated: 47 mL/min — ABNORMAL LOW (ref >60)
Glucose, Bld: 107 mg/dL — ABNORMAL HIGH (ref 70–99)
Glucose, Bld: 147 mg/dL — ABNORMAL HIGH (ref 70–99)
Potassium: 5.3 mmol/L — ABNORMAL HIGH (ref 3.5–5.1)
Potassium: 5.3 mmol/L — ABNORMAL HIGH (ref 3.5–5.1)
Sodium: 131 mmol/L — ABNORMAL LOW (ref 135–145)
Sodium: 132 mmol/L — ABNORMAL LOW (ref 135–145)
Total Bilirubin: 0.9 mg/dL (ref 0.3–1.2)
Total Bilirubin: 0.9 mg/dL (ref 0.3–1.2)
Total Protein: 5.3 g/dL — ABNORMAL LOW (ref 6.5–8.1)
Total Protein: 7.7 g/dL (ref 6.5–8.1)

## 2020-12-14 LAB — URINALYSIS, COMPLETE (UACMP) WITH MICROSCOPIC
Bilirubin Urine: NEGATIVE
Glucose, UA: NEGATIVE mg/dL
Ketones, ur: NEGATIVE mg/dL
Nitrite: POSITIVE — AB
Protein, ur: 100 mg/dL — AB
RBC / HPF: >50 RBC/hpf — ABNORMAL HIGH (ref 0–5)
Specific Gravity, Urine: 1.012 (ref 1.005–1.030)
Squamous Epithelial / HPF: NONE SEEN (ref 0–5)
WBC, UA: >50 WBC/hpf — ABNORMAL HIGH (ref 0–5)
pH: 8 (ref 5.0–8.0)

## 2020-12-14 LAB — RESP PANEL BY RT-PCR (FLU A&B, COVID) ARPGX2
Influenza A by PCR: NEGATIVE
Influenza B by PCR: NEGATIVE
SARS Coronavirus 2 by RT PCR: NEGATIVE

## 2020-12-14 LAB — CBC WITH DIFFERENTIAL/PLATELET
Abs Immature Granulocytes: 0.34 10*3/uL — ABNORMAL HIGH (ref 0.00–0.07)
Abs Immature Granulocytes: 0.39 10*3/uL — ABNORMAL HIGH (ref 0.00–0.07)
Basophils Absolute: 0.1 10*3/uL (ref 0.0–0.1)
Basophils Absolute: 0.1 10*3/uL (ref 0.0–0.1)
Basophils Relative: 0 %
Basophils Relative: 0 %
Eosinophils Absolute: 0 10*3/uL (ref 0.0–0.5)
Eosinophils Absolute: 0.1 10*3/uL (ref 0.0–0.5)
Eosinophils Relative: 0 %
Eosinophils Relative: 0 %
HCT: 27.4 % — ABNORMAL LOW (ref 39.0–52.0)
HCT: 33.9 % — ABNORMAL LOW (ref 39.0–52.0)
Hemoglobin: 11.4 g/dL — ABNORMAL LOW (ref 13.0–17.0)
Hemoglobin: 9 g/dL — ABNORMAL LOW (ref 13.0–17.0)
Immature Granulocytes: 1 %
Immature Granulocytes: 1 %
Lymphocytes Relative: 3 %
Lymphocytes Relative: 5 %
Lymphs Abs: 0.7 10*3/uL (ref 0.7–4.0)
Lymphs Abs: 1.4 10*3/uL (ref 0.7–4.0)
MCH: 32.4 pg (ref 26.0–34.0)
MCH: 32.8 pg (ref 26.0–34.0)
MCHC: 32.8 g/dL (ref 30.0–36.0)
MCHC: 33.6 g/dL (ref 30.0–36.0)
MCV: 97.4 fL (ref 80.0–100.0)
MCV: 98.6 fL (ref 80.0–100.0)
Monocytes Absolute: 1.6 10*3/uL — ABNORMAL HIGH (ref 0.1–1.0)
Monocytes Absolute: 1.8 10*3/uL — ABNORMAL HIGH (ref 0.1–1.0)
Monocytes Relative: 6 %
Monocytes Relative: 6 %
Neutro Abs: 24.6 10*3/uL — ABNORMAL HIGH (ref 1.7–7.7)
Neutro Abs: 25.2 10*3/uL — ABNORMAL HIGH (ref 1.7–7.7)
Neutrophils Relative %: 88 %
Neutrophils Relative %: 90 %
Platelets: 531 10*3/uL — ABNORMAL HIGH (ref 150–400)
Platelets: 747 10*3/uL — ABNORMAL HIGH (ref 150–400)
RBC: 2.78 MIL/uL — ABNORMAL LOW (ref 4.22–5.81)
RBC: 3.48 MIL/uL — ABNORMAL LOW (ref 4.22–5.81)
RDW: 12.2 % (ref 11.5–15.5)
RDW: 12.4 % (ref 11.5–15.5)
Smear Review: NORMAL
WBC: 27.9 10*3/uL — ABNORMAL HIGH (ref 4.0–10.5)
WBC: 28.3 10*3/uL — ABNORMAL HIGH (ref 4.0–10.5)
nRBC: 0 % (ref 0.0–0.2)
nRBC: 0 % (ref 0.0–0.2)

## 2020-12-14 LAB — LACTIC ACID, PLASMA
Lactic Acid, Venous: 1 mmol/L (ref 0.5–1.9)
Lactic Acid, Venous: 1.5 mmol/L (ref 0.5–1.9)

## 2020-12-14 LAB — PROTIME-INR
INR: 1.2 (ref 0.8–1.2)
INR: 1.3 — ABNORMAL HIGH (ref 0.8–1.2)
Prothrombin Time: 15.5 seconds — ABNORMAL HIGH (ref 11.4–15.2)
Prothrombin Time: 16.1 seconds — ABNORMAL HIGH (ref 11.4–15.2)

## 2020-12-14 LAB — PROCALCITONIN: Procalcitonin: <0.1 ng/mL

## 2020-12-14 LAB — APTT
aPTT: 37 seconds — ABNORMAL HIGH (ref 24–36)
aPTT: 37 seconds — ABNORMAL HIGH (ref 24–36)

## 2020-12-14 MED ORDER — OXYCODONE HCL 5 MG PO TABS: 2.5000 mg | ORAL_TABLET | ORAL | Status: DC | PRN

## 2020-12-14 MED ORDER — TAMSULOSIN HCL 0.4 MG PO CAPS
0.4000 mg | ORAL_CAPSULE | Freq: Every day | ORAL | Status: DC
  Administered 2020-12-14 – 2020-12-18 (×5): 0.4 mg via ORAL
  Filled 2020-12-14 (×5): qty 1

## 2020-12-14 MED ORDER — CEFEPIME HCL 2 G IJ SOLR
2.0000 g | INTRAMUSCULAR | Status: DC
Start: 2020-12-14 — End: 2020-12-16
  Administered 2020-12-14 – 2020-12-15 (×2): 2 g via INTRAVENOUS
  Filled 2020-12-14 (×3): qty 2

## 2020-12-14 MED ORDER — SODIUM CHLORIDE 0.9 % IV SOLN: INTRAVENOUS | Status: DC

## 2020-12-14 MED ORDER — AMLODIPINE BESYLATE 5 MG PO TABS
5.0000 mg | ORAL_TABLET | Freq: Every day | ORAL | Status: DC
  Administered 2020-12-14 – 2020-12-17 (×4): 5 mg via ORAL
  Filled 2020-12-14 (×4): qty 1

## 2020-12-14 MED ORDER — CHLORHEXIDINE GLUCONATE CLOTH 2 % EX PADS
6.0000 | MEDICATED_PAD | Freq: Every day | CUTANEOUS | Status: DC
  Administered 2020-12-14 – 2020-12-18 (×5): 6 via TOPICAL

## 2020-12-14 MED ORDER — IOHEXOL 350 MG/ML SOLN
50.0000 mL | Freq: Once | INTRAVENOUS | Status: AC | PRN
  Administered 2020-12-14: 50 mL via INTRAVENOUS

## 2020-12-14 MED ORDER — ADULT MULTIVITAMIN W/MINERALS CH
1.0000 | ORAL_TABLET | Freq: Every day | ORAL | Status: DC
  Administered 2020-12-14 – 2020-12-18 (×5): 1 via ORAL
  Filled 2020-12-14 (×5): qty 1

## 2020-12-14 MED ORDER — ALPRAZOLAM 0.25 MG PO TABS
0.2500 mg | ORAL_TABLET | Freq: Every evening | ORAL | Status: DC | PRN
  Administered 2020-12-14: 0.25 mg via ORAL
  Filled 2020-12-14: qty 1

## 2020-12-14 MED ORDER — BISACODYL 10 MG RE SUPP
10.0000 mg | Freq: Every day | RECTAL | Status: DC | PRN
  Administered 2020-12-14: 10 mg via RECTAL
  Filled 2020-12-14 (×2): qty 1

## 2020-12-14 MED ORDER — ENOXAPARIN SODIUM 40 MG/0.4ML IJ SOSY
30.0000 mg | PREFILLED_SYRINGE | INTRAMUSCULAR | Status: DC
  Administered 2020-12-14 – 2020-12-16 (×3): 30 mg via SUBCUTANEOUS
  Filled 2020-12-14 (×3): qty 0.4

## 2020-12-14 MED ORDER — POLYETHYLENE GLYCOL 3350 17 G PO PACK
17.0000 g | PACK | Freq: Every day | ORAL | Status: DC
  Administered 2020-12-14 – 2020-12-17 (×3): 17 g via ORAL
  Filled 2020-12-14 (×3): qty 1

## 2020-12-14 MED ORDER — ALPRAZOLAM ER 0.5 MG PO TB24
0.5000 mg | ORAL_TABLET | Freq: Every day | ORAL | Status: DC
  Administered 2020-12-14 – 2020-12-17 (×4): 0.5 mg via ORAL
  Filled 2020-12-14 (×5): qty 1

## 2020-12-14 MED ORDER — ONDANSETRON HCL 4 MG PO TABS: 4.0000 mg | ORAL_TABLET | Freq: Four times a day (QID) | ORAL | Status: DC | PRN

## 2020-12-14 MED ORDER — TRAMADOL HCL 50 MG PO TABS
50.0000 mg | ORAL_TABLET | Freq: Four times a day (QID) | ORAL | Status: DC | PRN
  Administered 2020-12-14: 50 mg via ORAL
  Filled 2020-12-14: qty 1

## 2020-12-14 MED ORDER — FINASTERIDE 5 MG PO TABS
5.0000 mg | ORAL_TABLET | Freq: Every day | ORAL | Status: DC
  Administered 2020-12-14 – 2020-12-18 (×5): 5 mg via ORAL
  Filled 2020-12-14 (×5): qty 1

## 2020-12-14 MED ORDER — TRAZODONE HCL 50 MG PO TABS
25.0000 mg | ORAL_TABLET | Freq: Every evening | ORAL | Status: DC | PRN
  Administered 2020-12-14 – 2020-12-17 (×3): 25 mg via ORAL
  Filled 2020-12-14 (×3): qty 1

## 2020-12-14 NOTE — ED Notes (Signed)
Admitting at bedside 

## 2020-12-14 NOTE — Progress Notes (Signed)
Pharmacy Antibiotic Note  Jacob Moses is a 87 y.o. male admitted on 12/13/2020 with UTI.  Pharmacy has been consulted for Cefepime dosing for 7 days of treatment.  Plan: Cefepime 2 gm q24h for 7 days per indication and renal fxn.  Pharmacy will continue to follow and adjust dose if warranted.  Height: '5\' 11"'$  (180.3 cm) Weight: 59 kg (130 lb) IBW/kg (Calculated) : 75.3  Temp (24hrs), Avg:100.2 F (37.9 C), Min:98.2 F (36.8 C), Max:102.9 F (39.4 C)  Recent Labs  Lab 12/13/20 2341  WBC 27.9*  CREATININE 1.61*  LATICACIDVEN 1.5    Estimated Creatinine Clearance: 24.9 mL/min (A) (by C-G formula based on SCr of 1.61 mg/dL (H)).    No Known Allergies  Antimicrobials this admission: 8/11 Vancomycin >> x 1 8/11 Flagyl >> x 1 8/11 Cefepime >> x 7 days  Microbiology results: 8/11 BCx: Pending 8/11 UCx: Pending   Thank you for allowing pharmacy to be a part of this patient's care.  Renda Rolls, PharmD, [facility] 12/14/2020 2:38 AM

## 2020-12-14 NOTE — Evaluation (Signed)
Occupational Therapy Evaluation Patient Details Name: Jacob Moses MRN: [id number] DOB: [birth year] Today's Date: 12/14/2020    History of Present Illness 87 y.o. male with history of HTN, hearing loss, vertigo-who was seen by his urologist a day prior to this hospitalization for Foley catheter removal-presented to the hospital with abdominal pain-he was found to have sepsis due to complicated UTI and acute urinary retention. He was also admitted to hospital end of july for R hip hemiarthroplasty.   Clinical Impression   Patient presenting with decreased I in self care, balance, functional mobility/transfer, endurance, and safety awareness. Patient's daughter present to confirm baseline. Prior to fall , pt lives at home independently and very active. Pt admitted to hospital 2 weeks ago after fall and s/p R hemiarthroplasty and discharged to SNF for short term rehab.Patient currently functioning mod A - min of 2(standing and functional mobility). Pt does fatigue quickly this session. He is very pleasant and cooperative throughout. OT reviewed precautions again with pt, family, and RN. Use of materials to make abductor pillow while in bed. OT recommends pt return to SNF to continue rehab program before returning home.  Patient will benefit from acute OT to increase overall independence in the areas of ADLs, functional mobility, and safety awareness in order to safely discharge to next venue of care.     Follow Up Recommendations  SNF    Equipment Recommendations  Other (comment) (defer to next venue of care)       Precautions / Restrictions Precautions Precautions: Fall;Posterior Hip Restrictions Weight Bearing Restrictions: Yes RLE Weight Bearing: Weight bearing as tolerated Other Position/Activity Restrictions: Asked daughter to bring abduction pillow. OT made one to place today in ED to maintain precautions.      Mobility Bed Mobility Overal bed mobility: Needs Assistance Bed  Mobility: Sit to Supine;Supine to Sit     Supine to sit: Mod assist Sit to supine: Max assist   General bed mobility comments: assist to maintain precautions and min cuing for technique. Pt needing assist with trunk support and R LE.    Transfers Overall transfer level: Needs assistance Equipment used: 2 person hand held assist Transfers: Sit to/from Stand Sit to Stand: +2 physical assistance              Balance Overall balance assessment: Needs assistance Sitting-balance support: Feet supported Sitting balance-Leahy Scale: Good     Standing balance support: Bilateral upper extremity supported;During functional activity Standing balance-Leahy Scale: Poor                             ADL either performed or assessed with clinical judgement   ADL Overall ADL's : Needs assistance/impaired     Grooming: Wash/dry hands;Wash/dry face;Sitting;Set up               Lower Body Dressing: Moderate assistance                 General ADL Comments: min HHA of 2 for standing and side steps along bed     Vision Baseline Vision/History: Wears glasses Wears Glasses: Reading only Patient Visual Report: No change from baseline              Pertinent Vitals/Pain Pain Assessment: Faces Faces Pain Scale: Hurts a little bit Pain Location: R LE with movement Pain Descriptors / Indicators: Grimacing;Sore;Guarding Pain Intervention(s): Limited activity within patient's tolerance;Monitored during session;Repositioned     Hand Dominance Right  Extremity/Trunk Assessment Upper Extremity Assessment Upper Extremity Assessment: Generalized weakness;Overall [facility] for tasks assessed   Lower Extremity Assessment Lower Extremity Assessment: Defer to PT evaluation;RLE deficits/detail   Cervical / Trunk Assessment Cervical / Trunk Assessment: Normal   Communication Communication Communication: HOH   Cognition Arousal/Alertness: Awake/alert Behavior During  Therapy: WFL for tasks assessed/performed Overall Cognitive Status: Within Functional Limits for tasks assessed                                 General Comments: Pt follows 1 step commands and is pleasant and cooperative. A & O x4.              Home Living Family/patient expects to be discharged to:: Skilled nursing facility                                        Prior Functioning/Environment Level of Independence: Independent        Comments: Pt lives at home independently prior to recent fall and hip fx. He has been at [facility] for rehab after R hip arthroplasty and admitted with new sepsis.        OT Problem List: Decreased strength;Decreased activity tolerance;Impaired balance (sitting and/or standing);Decreased knowledge of use of DME or AE;Pain;Decreased safety awareness;Decreased knowledge of precautions      OT Treatment/Interventions: Self-care/ADL training;Therapeutic activities;Therapeutic exercise;Patient/family education;DME and/or AE instruction;Manual therapy;Modalities;Energy conservation;Balance training    OT Goals(Current goals can be found in the care plan section) Acute Rehab OT Goals Patient Stated Goal: return to rehab OT Goal Formulation: With patient/family Time For Goal Achievement: 12/28/20 Potential to Achieve Goals: Good ADL Goals Pt Will Perform Grooming: with supervision;standing Pt Will Perform Lower Body Dressing: with min guard assist;sit to/from stand Pt Will Transfer to Toilet: with min guard assist;bedside commode Pt Will Perform Toileting - Clothing Manipulation and hygiene: with min guard assist;sit to/from stand  OT Frequency: Min 2X/week   Barriers to D/C:    none known at this time          AM-PAC OT "6 Clicks" Daily Activity     Outcome Measure Help from another person eating meals?: None Help from another person taking care of personal grooming?: A Little Help from another person toileting, which  includes using toliet, bedpan, or urinal?: A Lot Help from another person bathing (including washing, rinsing, drying)?: A Lot Help from another person to put on and taking off regular upper body clothing?: A Little Help from another person to put on and taking off regular lower body clothing?: A Lot 6 Click Score: 16   End of Session Nurse Communication: Mobility status;Weight bearing status;Precautions  Activity Tolerance: Patient tolerated treatment well Patient left: in bed;with call bell/phone within reach;with family/visitor present  OT Visit Diagnosis: Unsteadiness on feet (R26.81);Muscle weakness (generalized) (M62.81)                Time: [id number] OT Time Calculation (min): 24 min Charges:  OT General Charges $OT Visit: 1 Visit OT Evaluation $OT Eval Moderate Complexity: 1 Mod OT Treatments $Self Care/Home Management : 8-22 mins Darleen Crocker, MS, OTR/L , CBIS ascom [phone]  12/14/20, 1:15 PM

## 2020-12-14 NOTE — ED Notes (Signed)
Spoke with Stanton Kidney the nurse at [facility] taking care of the patient to inform her patient will be admitted to the hospital.

## 2020-12-14 NOTE — Progress Notes (Signed)
PT Cancellation Note  Patient Details Name: Jacob Moses MRN: [id number] DOB: 1928/02/13   Cancelled Treatment:    Reason Eval/Treat Not Completed: Other (comment). Consult received and chart reviewed. Pt currently out of room at this time. Will re-attempt, time permitting.   Haydn Hutsell 12/14/2020, 3:34 PM Greggory Stallion, PT, DPT [phone]

## 2020-12-14 NOTE — ED Notes (Signed)
ED Provider at bedside. 

## 2020-12-14 NOTE — H&P (Addendum)
[place]   PATIENT NAME: Jacob Moses    MR#:  [id number]  DATE OF BIRTH:  29-Apr-1928  DATE OF ADMISSION:  12/13/2020  PRIMARY CARE PHYSICIAN: Albina Billet, MD   Patient is coming from: Home  REQUESTING/REFERRING PHYSICIAN: Ward, Cyril Mourning, DO  CHIEF COMPLAINT:   Chief Complaint  Patient presents with   Abdominal Pain    HISTORY OF PRESENT ILLNESS:  Jacob Moses is a 87 y.o. Caucasian male with medical history significant for essential hypertension, hearing loss, peptic ulcer disease, and vertigo, who presented to the emergency room with acute onset of upper abdominal pain with associated fever at [facility].  The patient was seen by a urologist yesterday.  His daughter and he took his Foley catheter out.  He was noted to be adequately emptying his bladder at that time.  When he went back to his SNF however the staff had to place an in and out catheter.  Here in the ER he was noted to have urinary retention and after having a Foley catheter placed he had about 2 L of urine output that was dark bloody with sediments.  He has been having nausea without vomiting or diarrhea.  No chest pain or dyspnea or palpitations or cough.  ED Course: Upon presenting to the emergency room vital signs were within normal.  Labs revealed mild hyponatremia and hyperkalemia with hypochloremia, BUN of 34 and creatinine 1.61 minimally above previous levels.  He had significant leukocytosis of 27.9 with neutrophilia.  UA was strongly positive for UTI.  Blood and urine cultures were sent.  EKG as reviewed by me : Showed normal sinus rhythm with rate of 87 with PACs, prolonged PR interval and T wave inversion anteroseptally with early repolarization inferiorly.. Imaging: Chest x-ray showed no acute cardiopulmonary disease.  Abdominal pelvic CT scan showed mild rectal stool burden, normal appendix, mild bilateral hydroureteronephrosis that is nonspecific but could be related to chronic bladder  outlet obstruction.  The patient was given 1 g of p.o. Tylenol, IV cefepime, vancomycin and Flagyl and 2 L bolus of IV lactated Ringer followed 150 mill per hour.  He will be admitted to a progressive unit bed for further evaluation and management. PAST MEDICAL HISTORY:   Past Medical History:  Diagnosis Date   H/O vertigo    History of stomach ulcers    HOH (hard of hearing)    Hypertension    Insomnia    Wears dentures     PAST SURGICAL HISTORY:   Past Surgical History:  Procedure Laterality Date   APPENDECTOMY  1999   Dr.Byrnett   BELPHAROPTOSIS REPAIR  2010   CATARACT EXTRACTION W/PHACO Left 09/13/2019   Procedure: CATARACT EXTRACTION PHACO AND INTRAOCULAR LENS PLACEMENT (IOC) LEFT  5.06  00:48.8;  Surgeon: Birder Robson, MD;  Location: [place];  Service: Ophthalmology;  Laterality: Left;   CATARACT EXTRACTION W/PHACO Right 10/04/2019   Procedure: CATARACT EXTRACTION PHACO AND INTRAOCULAR LENS PLACEMENT ([place]) RIGHT;  Surgeon: Birder Robson, MD;  Location: [place];  Service: Ophthalmology;  Laterality: Right;  4.64 0:43.9   ECTROPION REPAIR Bilateral 04/24/2015   Procedure: REPAIR OF ECTROPION TARSAL WEDGE LEFT EYE REPAIR EXTENSIVE BILATERAL;  Surgeon: Karle Starch, MD;  Location: [place];  Service: Ophthalmology;  Laterality: Bilateral;   HERNIA REPAIR  1999   Dr.Byrnett   HIP ARTHROPLASTY Right 11/29/2020   Procedure: ARTHROPLASTY BIPOLAR HIP (HEMIARTHROPLASTY)-Extra Scrub;  Surgeon: Leim Fabry, MD;  Location:  [facility];  Service: Orthopedics;  Laterality: Right;   LACRIMAL DUCT EXPLORATION Bilateral 04/24/2015   Procedure: LACRIMAL DUCT EXPLORATION PUNCTOPLASTY;  Surgeon: Karle Starch, MD;  Location: [place];  Service: Ophthalmology;  Laterality: Bilateral;   SEPTOPLASTY     STOMACH SURGERY  1966   ulcer    SOCIAL HISTORY:   Social History   Tobacco Use   Smoking status: Former    Years: 30.00    Types:  Cigarettes    Quit date: 05/05/1978    Years since quitting: 42.6   Smokeless tobacco: Never  Substance Use Topics   Alcohol use: No    FAMILY HISTORY:   Family History  Problem Relation Age of Onset   Cancer Mother    Diabetes Father     DRUG ALLERGIES:  No Known Allergies  REVIEW OF SYSTEMS:   ROS per his daughter as the patient is hard of hearing. As per history of present illness. All pertinent systems were reviewed above. Constitutional, HEENT, cardiovascular, respiratory, GI, GU, musculoskeletal, neuro, psychiatric, endocrine, integumentary and hematologic systems were reviewed and are otherwise negative/unremarkable except for positive findings mentioned above in the HPI.   MEDICATIONS AT HOME:   Prior to Admission medications   Medication Sig Start Date End Date Taking? Authorizing Provider  ALPRAZolam (XANAX XR) 0.5 MG 24 hr tablet Take 0.5 mg by mouth daily.    [provider]  amLODipine (NORVASC) 5 MG tablet Take 1 tablet (5 mg total) by mouth daily. 02/25/19   Demetrios Loll, MD  enoxaparin (LOVENOX) 40 MG/0.4ML injection Inject 0.3 mLs (30 mg total) into the skin daily for 14 days. 12/03/20 12/17/20  Wyvonnia Dusky, MD  Multiple Vitamins-Minerals (MULTIVITAMIN WITH MINERALS) tablet Take 1 tablet by mouth daily.    [provider]  ondansetron (ZOFRAN) 4 MG tablet Take 1 tablet (4 mg total) by mouth every 6 (six) hours as needed for nausea. 11/30/20   Reche Dixon, PA-C  oxyCODONE (OXY IR/ROXICODONE) 5 MG immediate release tablet Take 0.5-1 tablets (2.5-5 mg total) by mouth every 4 (four) hours as needed for moderate pain (pain score 4-6). 11/30/20   Reche Dixon, PA-C  traMADol (ULTRAM) 50 MG tablet Take 1 tablet (50 mg total) by mouth every 6 (six) hours as needed for moderate pain. 11/30/20   Reche Dixon, PA-C  traZODone (DESYREL) 50 MG tablet Take 0.5 tablets (25 mg total) by mouth at bedtime as needed for sleep. 02/25/19   Demetrios Loll, MD      VITAL  SIGNS:  Blood pressure (!) 110/55, pulse 86, temperature 99.5 F (37.5 C), temperature source Rectal, resp. rate 20, height '5\' 11"'$  (1.803 m), weight 59 kg, SpO2 100 %.  PHYSICAL EXAMINATION:  Physical Exam  GENERAL:  87 y.o.-year-old patient lying in the bed with no acute distress.  EYES: Pupils equal, round, reactive to light and accommodation. No scleral icterus. Extraocular muscles intact.  HEENT: Head atraumatic, normocephalic. Oropharynx and nasopharynx clear.  NECK:  Supple, no jugular venous distention. No thyroid enlargement, no tenderness.  LUNGS: Normal breath sounds bilaterally, no wheezing, rales,rhonchi or crepitation. No use of accessory muscles of respiration.  CARDIOVASCULAR: Regular rate and rhythm, S1, S2 normal. No murmurs, rubs, or gallops.  ABDOMEN: Soft, nondistended, nontender. Bowel sounds present. No organomegaly or mass.  EXTREMITIES: No pedal edema, cyanosis, or clubbing.  NEUROLOGIC: Cranial nerves II through XII are intact. Muscle strength 5/5 in all extremities. Sensation intact. Gait not checked.  PSYCHIATRIC: The patient is  alert and oriented x 3.  Normal affect and good eye contact. SKIN: No obvious rash, lesion, or ulcer.   LABORATORY PANEL:   CBC Recent Labs  Lab 12/13/20 2341  WBC 27.9*  HGB 11.4*  HCT 33.9*  PLT 747*   ------------------------------------------------------------------------------------------------------------------  Chemistries  Recent Labs  Lab 12/13/20 2341  NA 131*  K 5.3*  CL 95*  CO2 28  GLUCOSE 147*  BUN 34*  CREATININE 1.61*  CALCIUM 8.6*  AST 24  ALT 17  ALKPHOS 100  BILITOT 0.9   ------------------------------------------------------------------------------------------------------------------  Cardiac Enzymes No results for input(s): TROPONINI in the last 168 hours. ------------------------------------------------------------------------------------------------------------------  RADIOLOGY:  CT  ABDOMEN PELVIS W CONTRAST  Result Date: 12/14/2020 CLINICAL DATA:  Abdominal pain, bloating EXAM: CT ABDOMEN AND PELVIS WITH CONTRAST TECHNIQUE: Multidetector CT imaging of the abdomen and pelvis was performed using the standard protocol following bolus administration of intravenous contrast. CONTRAST:  59m OMNIPAQUE IOHEXOL 350 MG/ML SOLN COMPARISON:  None. FINDINGS: Lower chest: Mild patchy bibasilar opacities, likely atelectasis. Hepatobiliary: Liver is within normal limits. Gallbladder is unremarkable. No intrahepatic or extrahepatic ductal dilatation. Pancreas: Within normal limits. Spleen: Within normal limits. Adrenals/Urinary Tract: Adrenal glands are within normal limits. 5.0 cm right lower pole renal cyst (series 2/image 35). Additional 2.0 cm right upper pole renal cyst (series 2/image 19). Left kidney is within normal limits. Mild bilateral hydroureteronephrosis. Bladder decompressed by an indwelling Foley catheter, poorly visualized due to streak artifact. Stomach/Bowel: Surgical clips at the GE junction. No evidence of bowel obstruction. Normal appendix (series 2/image 63). Mild rectal stool burden. Vascular/Lymphatic: No evidence of abdominal aortic aneurysm. Atherosclerotic calcifications of the abdominal aorta and branch vessels. No suspicious abdominopelvic lymphadenopathy. Reproductive: Prostate is unremarkable. Other: No abdominopelvic ascites. Musculoskeletal: Degenerative changes of the visualized thoracolumbar spine. Right hip hemiarthroplasty. IMPRESSION: No evidence of bowel obstruction. Normal appendix. Mild rectal stool burden. Mild bilateral hydroureteronephrosis. This appearance is nonspecific but may be related to chronic bladder outlet obstruction. Bladder decompressed by an indwelling Foley catheter. Additional ancillary findings as above. Electronically Signed   By: SJulian HyM.D.   On: 12/14/2020 01:30   DG Chest Port 1 View  Result Date: 12/13/2020 CLINICAL DATA:   Bloating no bowel movement EXAM: PORTABLE CHEST 1 VIEW COMPARISON:  11/29/2020 FINDINGS: The heart size and mediastinal contours are within normal limits. Both lungs are clear. The visualized skeletal structures are unremarkable. IMPRESSION: No active disease. Electronically Signed   By: KDonavan FoilM.D.   On: 12/13/2020 23:35      IMPRESSION AND PLAN:  Active Problems:   Sepsis due to gram-negative UTI ([place]  1.  Sepsis due to UTI with associated urinary retention and mild obstructive uropathy, relieved by Foley catheter placement and bladder drainage.  The patient has associated gross hematuria. - The patient will be admitted to a medical monitored bed. - We will continue antibiotic therapy with IV cefepime. - We will follow urine and blood cultures. - Urology consult will be obtained. - I notified Dr. SJeb Leveringabout the patient.  2.  Mild acute kidney injury superimposed on stage IIIb chronic kidney disease. - The patient will be hydrated with IV normal saline and will follow BMP.  3.  Mild hyponatremia with associated hyperkalemia, likely secondary to volume depletion and dehydration with mild acute kidney injury. - The patient will be hydrated with normal saline as mentioned above and will follow be Emokpae's.  4.  Essential hypertension. - We will continue Norvasc.  5.  BPH. -  We will continue Flomax.  DVT prophylaxis: SCDs.  Medical prophylaxis currently held off due to hematuria. Code Status: full code. Family Communication:  The plan of care was discussed in details with the patient (and family). I answered all questions. The patient agreed to proceed with the above mentioned plan. Further management will depend upon hospital course. Disposition Plan: Back to previous home environment Consults called: Urology.   All the records are reviewed and case discussed with ED provider.  Status is: Inpatient  Remains inpatient appropriate because:Ongoing active pain requiring  inpatient pain management, Ongoing diagnostic testing needed not appropriate for outpatient work up, Unsafe d/c plan, IV treatments appropriate due to intensity of illness or inability to take PO, and Inpatient level of care appropriate due to severity of illness  Dispo: The patient is from: SNF              Anticipated d/c is to: SNF              Patient currently is not medically stable to d/c.   Difficult to place patient No   TOTAL TIME TAKING CARE OF THIS PATIENT: 55 minutes.    Christel Mormon M.D on 12/14/2020 at 2:31 AM  [facility]   From 7 PM-7 AM, contact night-coverage [email or web address]  CC: Primary care physician; Albina Billet, MD

## 2020-12-14 NOTE — ED Notes (Signed)
Patient transported to CT 

## 2020-12-14 NOTE — Progress Notes (Addendum)
PROGRESS NOTE        PATIENT DETAILS Name: Jacob Moses Age: 87 y.o. Sex: male Date of Birth: 1928-04-02 Admit Date: 12/13/2020 Admitting Physician Christel Mormon, MD [id number], Leona Carry, MD  Brief Narrative: Patient is a 87 y.o. male with history of HTN, hearing loss, vertigo-who was seen by his urologist a day prior to this hospitalization for Foley catheter removal-presented to the hospital with abdominal pain-he was found to have sepsis due to complicated UTI and acute urinary retention.  See below for further details.  Significant events: 7/28-8/1>> hospitalization for right hip fracture-discharged to SNF.  Foley catheter apparently inserted at SNF. 8/10>> Foley catheter removed by urology as outpatient 8/11>> to [facility] ED with fever/abdominal pain-found to have acute urinary retention-sepsis due to UTI  Significant studies: 8/11>> CXR: No pneumonia 8/12>> CT abdomen/pelvis: No SBO-mild stool burden.  Mild bilateral hydroureteronephrosis.  Antimicrobial therapy: Cefepime: 8/11>> Vancomycin: 8/11 x 1 Flagyl: 8/11 x 1  Microbiology data: 8/11>> urine culture: Pending 8/11>> blood culture: Negative so far  Procedures : 8/11>> Foley catheter reinserted in the emergency room.  Consults: None  DVT Prophylaxis : enoxaparin (LOVENOX) injection 30 mg Start: 12/14/20 0230   Subjective: Very hard of hearing-but denies any chest pain or shortness of breath.  Hematuria evident in Foley bag.   Assessment/Plan: Sepsis due to complicated UTI (related to recent Foley catheter insertion): Sepsis physiology improving-continue cefepime-await culture data.  Acute urinary retention: Probably has underlying BPH-voiding trial at urologist office on 8/10 was successful.  We will start Flomax-admitting MD consulted urology-we will await evaluation (I did speak with Dr. Jeffie Pollock over the phone today).  Hematuria: Either from UTI or from BPH.  Hopefully will improve with  antimicrobial therapy.  We will add finasteride in case this is from BPH.  No structural abnormality seen on imaging.  Await urology input.  AKI on CKD stage IIIb: AKI due to sepsis physiology and urinary retention.  Improving with supportive care-avoid nephrotoxic agents.  Follow creatinine trend.  HTN: BP controlled-continue amlodipine  BPH: See above  Recent right hip fracture: Continue PT/OT-on prophylactic Lovenox.  Diet: Diet Order             Diet NPO time specified  Diet effective now                    Code Status:  DNR-reconfirmed with daughter [name] on 8/12.  Family Communication: Spoke with daughter-Donna-[phone] over phone on 8/12  Disposition Plan: Status is: Inpatient  Remains inpatient appropriate because:Inpatient level of care appropriate due to severity of illness  Dispo: The patient is from: SNF              Anticipated d/c is to: SNF              Patient currently is not medically stable to d/c.   Difficult to place patient No   Barriers to Discharge: Resolving sepsis physiology-on IV antibiotics-not yet stable for discharge.  Antimicrobial agents: Anti-infectives (From admission, onward)    Start     Dose/Rate Route Frequency Ordered Stop   12/14/20 2200  ceFEPIme (MAXIPIME) 2 g in sodium chloride 0.9 % 100 mL IVPB        2 g 200 mL/hr over 30 Minutes Intravenous Every 24 hours 12/14/20 0229 12/20/20 2159   12/13/20 2330  ceFEPIme (  MAXIPIME) 2 g in sodium chloride 0.9 % 100 mL IVPB        2 g 200 mL/hr over 30 Minutes Intravenous  Once 12/13/20 2319 12/14/20 0039   12/13/20 2330  metroNIDAZOLE (FLAGYL) IVPB 500 mg        500 mg 100 mL/hr over 60 Minutes Intravenous  Once 12/13/20 2319 12/14/20 0348   12/13/20 2330  vancomycin (VANCOCIN) IVPB 1000 mg/200 mL premix  Status:  Discontinued        1,000 mg 200 mL/hr over 60 Minutes Intravenous  Once 12/13/20 2319 12/13/20 2326   12/13/20 2330  vancomycin (VANCOREADY) IVPB 1250 mg/250 mL         1,250 mg 166.7 mL/hr over 90 Minutes Intravenous  Once 12/13/20 2326 12/14/20 0239        Time spent: 35 minutes-Greater than 50% of this time was spent in counseling, explanation of diagnosis, planning of further management, and coordination of care.  MEDICATIONS: Scheduled Meds:  ALPRAZolam  0.5 mg Oral Daily   amLODipine  5 mg Oral Daily   enoxaparin  30 mg Subcutaneous Q24H   multivitamin with minerals  1 tablet Oral Daily   Continuous Infusions:  sodium chloride 100 mL/hr at 12/14/20 0649   ceFEPime (MAXIPIME) IV     lactated ringers Stopped (12/14/20 0647)   PRN Meds:.ondansetron, oxyCODONE, traMADol, traZODone   PHYSICAL EXAM: Vital signs: Vitals:   12/14/20 0530 12/14/20 0600 12/14/20 0630 12/14/20 0958  BP: (!) 110/53 (!) 102/53 (!) 116/55 120/61  Pulse: 61 61 60 66  Resp: '16 16 18 17  '$ Temp:      TempSrc:      SpO2: 93% 94% 97% 98%  Weight:      Height:       Filed Weights   12/14/20 0141  Weight: 59 kg   Body mass index is 18.13 kg/m.   Gen Exam:Alert awake-not in any distress-very hard of hearing. HEENT:atraumatic, normocephalic Chest: B/L clear to auscultation anteriorly CVS:S1S2 regular Abdomen:soft non tender, non distended Extremities:no edema Neurology: Non focal-with generalized weakness. Skin: no rash  I have personally reviewed following labs and imaging studies  LABORATORY DATA: CBC: Recent Labs  Lab 12/13/20 2341 12/14/20 0705  WBC 27.9* 28.3*  NEUTROABS 25.2* 24.6*  HGB 11.4* 9.0*  HCT 33.9* 27.4*  MCV 97.4 98.6  PLT 747* 531*    Basic Metabolic Panel: Recent Labs  Lab 12/13/20 2341 12/14/20 0705  NA 131* 132*  K 5.3* 5.3*  CL 95* 100  CO2 28 25  GLUCOSE 147* 107*  BUN 34* 30*  CREATININE 1.61* 1.42*  CALCIUM 8.6* 7.8*    GFR: Estimated Creatinine Clearance: 28.3 mL/min (A) (by C-G formula based on SCr of 1.42 mg/dL (H)).  Liver Function Tests: Recent Labs  Lab 12/13/20 2341 12/14/20 0705  AST 24  17  ALT 17 11  ALKPHOS 100 67  BILITOT 0.9 0.9  PROT 7.7 5.3*  ALBUMIN 3.6 2.4*   No results for input(s): LIPASE, AMYLASE in the last 168 hours. No results for input(s): AMMONIA in the last 168 hours.  Coagulation Profile: Recent Labs  Lab 12/13/20 2341 12/14/20 0705  INR 1.3* 1.2    Cardiac Enzymes: No results for input(s): CKTOTAL, CKMB, CKMBINDEX, TROPONINI in the last 168 hours.  BNP (last 3 results) No results for input(s): PROBNP in the last 8760 hours.  Lipid Profile: No results for input(s): CHOL, HDL, LDLCALC, TRIG, CHOLHDL, LDLDIRECT in the last 72 hours.  Thyroid Function Tests:  No results for input(s): TSH, T4TOTAL, FREET4, T3FREE, THYROIDAB in the last 72 hours.  Anemia Panel: No results for input(s): VITAMINB12, FOLATE, FERRITIN, TIBC, IRON, RETICCTPCT in the last 72 hours.  Urine analysis:    Component Value Date/Time   COLORURINE AMBER (A) 12/13/2020 2341   APPEARANCEUR CLOUDY (A) 12/13/2020 2341   LABSPEC 1.012 12/13/2020 2341   PHURINE 8.0 12/13/2020 2341   GLUCOSEU NEGATIVE 12/13/2020 2341   HGBUR LARGE (A) 12/13/2020 2341   BILIRUBINUR NEGATIVE 12/13/2020 2341   KETONESUR NEGATIVE 12/13/2020 2341   PROTEINUR 100 (A) 12/13/2020 2341   NITRITE POSITIVE (A) 12/13/2020 2341   LEUKOCYTESUR LARGE (A) 12/13/2020 2341    Sepsis Labs: Lactic Acid, Venous    Component Value Date/Time   LATICACIDVEN 1.0 12/14/2020 0705    MICROBIOLOGY: Recent Results (from the past 240 hour(s))  Resp Panel by RT-PCR (Flu A&B, Covid) Nasopharyngeal Swab     Status: None   Collection Time: 12/13/20 11:41 PM   Specimen: Nasopharyngeal Swab; Nasopharyngeal(NP) swabs in vial transport medium  Result Value Ref Range Status   SARS Coronavirus 2 by RT PCR NEGATIVE NEGATIVE Final    Comment: (NOTE) SARS-CoV-2 target nucleic acids are NOT DETECTED.  The SARS-CoV-2 RNA is generally detectable in upper respiratory specimens during the acute phase of infection. The  lowest concentration of SARS-CoV-2 viral copies this assay can detect is 138 copies/mL. A negative result does not preclude SARS-Cov-2 infection and should not be used as the sole basis for treatment or other patient management decisions. A negative result may occur with  improper specimen collection/handling, submission of specimen other than nasopharyngeal swab, presence of viral mutation(s) within the areas targeted by this assay, and inadequate number of viral copies(<138 copies/mL). A negative result must be combined with clinical observations, patient history, and epidemiological information. The expected result is Negative.  Fact Sheet for Patients:  EntrepreneurPulse.com.au  Fact Sheet for Healthcare Providers:  IncredibleEmployment.be  This test is no t yet approved or cleared by the [place] FDA and  has been authorized for detection and/or diagnosis of SARS-CoV-2 by FDA under an Emergency Use Authorization (EUA). This EUA will remain  in effect (meaning this test can be used) for the duration of the COVID-19 declaration under Section 564(b)(1) of the Act, 21 U.S.C.section 360bbb-3(b)(1), unless the authorization is terminated  or revoked sooner.       Influenza A by PCR NEGATIVE NEGATIVE Final   Influenza B by PCR NEGATIVE NEGATIVE Final    Comment: (NOTE) The Xpert Xpress SARS-CoV-2/FLU/RSV plus assay is intended as an aid in the diagnosis of influenza from Nasopharyngeal swab specimens and should not be used as a sole basis for treatment. Nasal washings and aspirates are unacceptable for Xpert Xpress SARS-CoV-2/FLU/RSV testing.  Fact Sheet for Patients: EntrepreneurPulse.com.au  Fact Sheet for Healthcare Providers: IncredibleEmployment.be  This test is not yet approved or cleared by the [place] FDA and has been authorized for detection and/or diagnosis of SARS-CoV-2 by FDA under  an Emergency Use Authorization (EUA). This EUA will remain in effect (meaning this test can be used) for the duration of the COVID-19 declaration under Section 564(b)(1) of the Act, 21 U.S.C. section 360bbb-3(b)(1), unless the authorization is terminated or revoked.  Performed at [facility], [address] [place], [place] [address]   Blood Culture (routine x 2)     Status: None (Preliminary result)   Collection Time: 12/13/20 11:41 PM   Specimen: BLOOD RIGHT FOREARM  Result Value Ref  Range Status   Specimen Description BLOOD RIGHT FOREARM  Final   Special Requests   Final    BOTTLES DRAWN AEROBIC AND ANAEROBIC Blood Culture adequate volume   Culture   Final    NO GROWTH < 12 HOURS Performed at [facility], [address] [place], [place] [address]    Report Status PENDING  Incomplete  Blood Culture (routine x 2)     Status: None (Preliminary result)   Collection Time: 12/13/20 11:41 PM   Specimen: BLOOD RIGHT HAND  Result Value Ref Range Status   Specimen Description BLOOD RIGHT HAND  Final   Special Requests   Final    BOTTLES DRAWN AEROBIC AND ANAEROBIC Blood Culture adequate volume   Culture   Final    NO GROWTH < 12 HOURS Performed at [facility], [address], [place], [place] [address]    Report Status PENDING  Incomplete    RADIOLOGY STUDIES/RESULTS: CT ABDOMEN PELVIS W CONTRAST  Result Date: 12/14/2020 CLINICAL DATA:  Abdominal pain, bloating EXAM: CT ABDOMEN AND PELVIS WITH CONTRAST TECHNIQUE: Multidetector CT imaging of the abdomen and pelvis was performed using the standard protocol following bolus administration of intravenous contrast. CONTRAST:  31m OMNIPAQUE IOHEXOL 350 MG/ML SOLN COMPARISON:  None. FINDINGS: Lower chest: Mild patchy bibasilar opacities, likely atelectasis. Hepatobiliary: Liver is within normal limits. Gallbladder is unremarkable. No intrahepatic or extrahepatic ductal dilatation. Pancreas: Within normal  limits. Spleen: Within normal limits. Adrenals/Urinary Tract: Adrenal glands are within normal limits. 5.0 cm right lower pole renal cyst (series 2/image 35). Additional 2.0 cm right upper pole renal cyst (series 2/image 19). Left kidney is within normal limits. Mild bilateral hydroureteronephrosis. Bladder decompressed by an indwelling Foley catheter, poorly visualized due to streak artifact. Stomach/Bowel: Surgical clips at the GE junction. No evidence of bowel obstruction. Normal appendix (series 2/image 63). Mild rectal stool burden. Vascular/Lymphatic: No evidence of abdominal aortic aneurysm. Atherosclerotic calcifications of the abdominal aorta and branch vessels. No suspicious abdominopelvic lymphadenopathy. Reproductive: Prostate is unremarkable. Other: No abdominopelvic ascites. Musculoskeletal: Degenerative changes of the visualized thoracolumbar spine. Right hip hemiarthroplasty. IMPRESSION: No evidence of bowel obstruction. Normal appendix. Mild rectal stool burden. Mild bilateral hydroureteronephrosis. This appearance is nonspecific but may be related to chronic bladder outlet obstruction. Bladder decompressed by an indwelling Foley catheter. Additional ancillary findings as above. Electronically Signed   By: SJulian HyM.D.   On: 12/14/2020 01:30   DG Chest Port 1 View  Result Date: 12/13/2020 CLINICAL DATA:  Bloating no bowel movement EXAM: PORTABLE CHEST 1 VIEW COMPARISON:  11/29/2020 FINDINGS: The heart size and mediastinal contours are within normal limits. Both lungs are clear. The visualized skeletal structures are unremarkable. IMPRESSION: No active disease. Electronically Signed   By: KDonavan FoilM.D.   On: 12/13/2020 23:35     LOS: 0 days   SOren Binet MD  [facility]    To contact the attending provider between 7A-7P or the covering provider during after hours 7P-7A, please log into the web site [email or web address] and access using universal [place] password  for that web site. If you do not have the password, please call the hospital operator.  12/14/2020, 10:20 AM

## 2020-12-14 NOTE — ED Notes (Signed)
Pt back from CT at this time 

## 2020-12-14 NOTE — Progress Notes (Signed)
SLP Cancellation Note  Patient Details Name: Jacob Moses MRN: [id number] DOB: December 01, 1927   Cancelled treatment:       Reason Eval/Treat Not Completed: Patient at procedure or test/unavailable  Pt out of room, will re-attempt at next available opportunity.   Humberto Addo B. Rutherford Nail M.S., CCC-SLP, [place] [facility] [phone]  Stormy Fabian 12/14/2020, 3:48 PM

## 2020-12-14 NOTE — Consult Note (Signed)
Urology Consult  I have been asked to see the patient by Dr. Sloan Leiter, for evaluation and management of recurrent UTI with sepsis.  Chief Complaint: Urinary retention, constipation, chills  History of Present Illness: Jacob Moses is a 87 y.o. year old male with a recent history of urinary retention during hospitalization after a fall in which he sustained a right femoral neck fracture, s/p right hip hemiarthroplasty on 11/29/2020 who passed an outpatient voiding trial with me 2 days ago who presented to the ED overnight with reports of abdominal distention, constipation, and chills.  He was febrile on presentation to 39.4 C, tachycardic, and hypertensive.  Sepsis protocol was initiated.  Admission labs notable for lactate 1.5; creatinine 1.61 (at baseline); WBC count 27.9; UA with nitrites, >50 RBCs/hpf, >50 WBCs/hpf, many bacteria, and WBC clumps.  Urine culture pending, blood cultures pending with no growth at <12 hours.  Underwent CTAP with contrast this morning which revealed mild bilateral hydroureteronephrosis and a decompressed urinary bladder around an indwelling Foley catheter.  He is afebrile this morning, HR normal, and normotensive.  He is accompanied at the bedside by his daughter, who contributes to HPI.  They report that he was I&O catheterized at his facility when his abdominal distention was first noticed with greater than 1 L of urinary output with both gross hematuria and purulent output.  Foley catheter in place today.  It appears to have initially drained grossly bloody urine, however it is now draining yellow urine with copious sediment versus purulent debris.  Anti-infectives (From admission, onward)    Start     Dose/Rate Route Frequency Ordered Stop   12/14/20 2200  ceFEPIme (MAXIPIME) 2 g in sodium chloride 0.9 % 100 mL IVPB        2 g 200 mL/hr over 30 Minutes Intravenous Every 24 hours 12/14/20 0229 12/20/20 2159   12/13/20 2330  ceFEPIme (MAXIPIME) 2 g in  sodium chloride 0.9 % 100 mL IVPB        2 g 200 mL/hr over 30 Minutes Intravenous  Once 12/13/20 2319 12/14/20 0039   12/13/20 2330  metroNIDAZOLE (FLAGYL) IVPB 500 mg        500 mg 100 mL/hr over 60 Minutes Intravenous  Once 12/13/20 2319 12/14/20 0348   12/13/20 2330  vancomycin (VANCOCIN) IVPB 1000 mg/200 mL premix  Status:  Discontinued        1,000 mg 200 mL/hr over 60 Minutes Intravenous  Once 12/13/20 2319 12/13/20 2326   12/13/20 2330  vancomycin (VANCOREADY) IVPB 1250 mg/250 mL        1,250 mg 166.7 mL/hr over 90 Minutes Intravenous  Once 12/13/20 2326 12/14/20 0239        Past Medical History:  Diagnosis Date   H/O vertigo    History of stomach ulcers    HOH (hard of hearing)    Hypertension    Insomnia    Wears dentures     Past Surgical History:  Procedure Laterality Date   APPENDECTOMY  1999   Dr.Byrnett   Impact  2010   CATARACT EXTRACTION W/PHACO Left 09/13/2019   Procedure: CATARACT EXTRACTION PHACO AND INTRAOCULAR LENS PLACEMENT (IOC) LEFT  5.06  00:48.8;  Surgeon: Birder Robson, MD;  Location: [facility];  Service: Ophthalmology;  Laterality: Left;   CATARACT EXTRACTION W/PHACO Right 10/04/2019   Procedure: CATARACT EXTRACTION PHACO AND INTRAOCULAR LENS PLACEMENT ([place]) RIGHT;  Surgeon: Birder Robson, MD;  Location: [facility];  Service: Ophthalmology;  Laterality:  Right;  4.64 0:43.9   ECTROPION REPAIR Bilateral 04/24/2015   Procedure: REPAIR OF ECTROPION TARSAL WEDGE LEFT EYE REPAIR EXTENSIVE BILATERAL;  Surgeon: Karle Starch, MD;  Location: [facility];  Service: Ophthalmology;  Laterality: Bilateral;   HERNIA REPAIR  1999   Dr.Byrnett   HIP ARTHROPLASTY Right 11/29/2020   Procedure: ARTHROPLASTY BIPOLAR HIP (HEMIARTHROPLASTY)-Extra Scrub;  Surgeon: Leim Fabry, MD;  Location: [facility];  Service: Orthopedics;  Laterality: Right;   LACRIMAL DUCT EXPLORATION Bilateral 04/24/2015   Procedure: LACRIMAL DUCT  EXPLORATION PUNCTOPLASTY;  Surgeon: Karle Starch, MD;  Location: [facility];  Service: Ophthalmology;  Laterality: Bilateral;   SEPTOPLASTY     STOMACH SURGERY  1966   ulcer    Home Medications:  Current Meds  Medication Sig   ALPRAZolam (XANAX XR) 0.5 MG 24 hr tablet Take 0.5 mg by mouth daily.   amLODipine (NORVASC) 5 MG tablet Take 1 tablet (5 mg total) by mouth daily.   cholecalciferol (VITAMIN D3) 25 MCG (1000 UNIT) tablet Take 2,000 Units by mouth daily.   docusate sodium (COLACE) 100 MG capsule Take 100 mg by mouth 2 (two) times daily.   enoxaparin (LOVENOX) 40 MG/0.4ML injection Inject 0.3 mLs (30 mg total) into the skin daily for 14 days.   lactulose (CHRONULAC) 10 GM/15ML solution Take 20 g by mouth daily as needed for mild constipation.   Multiple Vitamins-Minerals (MULTIVITAMIN WITH MINERALS) tablet Take 1 tablet by mouth daily.   ondansetron (ZOFRAN) 4 MG tablet Take 1 tablet (4 mg total) by mouth every 6 (six) hours as needed for nausea.   oxyCODONE (OXY IR/ROXICODONE) 5 MG immediate release tablet Take 0.5-1 tablets (2.5-5 mg total) by mouth every 4 (four) hours as needed for moderate pain (pain score 4-6).   polyethylene glycol (MIRALAX / GLYCOLAX) 17 g packet Take 17 g by mouth daily as needed.   tamsulosin (FLOMAX) 0.4 MG CAPS capsule Take 0.4 mg by mouth daily.   traMADol (ULTRAM) 50 MG tablet Take 1 tablet (50 mg total) by mouth every 6 (six) hours as needed for moderate pain.   traZODone (DESYREL) 50 MG tablet Take 0.5 tablets (25 mg total) by mouth at bedtime as needed for sleep.   zinc oxide 20 % ointment Apply 1 application topically 2 (two) times daily as needed for irritation.    Allergies: No Known Allergies  Family History  Problem Relation Age of Onset   Cancer Mother    Diabetes Father     Social History:  reports that he quit smoking about 42 years ago. His smoking use included cigarettes. He has never used smokeless tobacco. He reports that he  does not drink alcohol and does not use drugs.  ROS: A complete review of systems was performed.  All systems are negative except for pertinent findings as noted.  Physical Exam:  Vital signs in last 24 hours: Temp:  [98.2 F (36.8 C)-102.9 F (39.4 C)] 99.5 F (37.5 C) (08/12 0154) Pulse Rate:  [58-109] 66 (08/12 0958) Resp:  [9-32] 17 (08/12 0958) BP: (92-172)/(51-91) 120/61 (08/12 0958) SpO2:  [69 %-100 %] 98 % (08/12 0958) Weight:  [59 kg] 59 kg (08/12 0141) Constitutional:  Alert and oriented, no acute distress HEENT: [place] AT, moist mucus membranes Cardiovascular: No clubbing, cyanosis, or edema Respiratory: Normal respiratory effort Skin: No rashes, bruises or suspicious lesions Neurologic: Grossly intact, no focal deficits, moving all 4 extremities Psychiatric: Normal mood and affect   Laboratory Data:  Recent Labs  12/13/20 2341 12/14/20 0705  WBC 27.9* 28.3*  HGB 11.4* 9.0*  HCT 33.9* 27.4*   Recent Labs    12/13/20 2341 12/14/20 0705  NA 131* 132*  K 5.3* 5.3*  CL 95* 100  CO2 28 25  GLUCOSE 147* 107*  BUN 34* 30*  CREATININE 1.61* 1.42*  CALCIUM 8.6* 7.8*   Recent Labs    12/13/20 2341 12/14/20 0705  INR 1.3* 1.2   No results for input(s): LABURIN in the last 72 hours. Urinalysis    Component Value Date/Time   COLORURINE AMBER (A) 12/13/2020 2341   APPEARANCEUR CLOUDY (A) 12/13/2020 2341   LABSPEC 1.012 12/13/2020 2341   PHURINE 8.0 12/13/2020 2341   GLUCOSEU NEGATIVE 12/13/2020 2341   HGBUR LARGE (A) 12/13/2020 2341   BILIRUBINUR NEGATIVE 12/13/2020 2341   KETONESUR NEGATIVE 12/13/2020 2341   PROTEINUR 100 (A) 12/13/2020 2341   NITRITE POSITIVE (A) 12/13/2020 2341   LEUKOCYTESUR LARGE (A) 12/13/2020 2341   Results for orders placed or performed during the hospital encounter of 12/13/20  Resp Panel by RT-PCR (Flu A&B, Covid) Nasopharyngeal Swab     Status: None   Collection Time: 12/13/20 11:41 PM   Specimen: Nasopharyngeal Swab;  Nasopharyngeal(NP) swabs in vial transport medium  Result Value Ref Range Status   SARS Coronavirus 2 by RT PCR NEGATIVE NEGATIVE Final    Comment: (NOTE) SARS-CoV-2 target nucleic acids are NOT DETECTED.  The SARS-CoV-2 RNA is generally detectable in upper respiratory specimens during the acute phase of infection. The lowest concentration of SARS-CoV-2 viral copies this assay can detect is 138 copies/mL. A negative result does not preclude SARS-Cov-2 infection and should not be used as the sole basis for treatment or other patient management decisions. A negative result may occur with  improper specimen collection/handling, submission of specimen other than nasopharyngeal swab, presence of viral mutation(s) within the areas targeted by this assay, and inadequate number of viral copies(<138 copies/mL). A negative result must be combined with clinical observations, patient history, and epidemiological information. The expected result is Negative.  Fact Sheet for Patients:  EntrepreneurPulse.com.au  Fact Sheet for Healthcare Providers:  IncredibleEmployment.be  This test is no t yet approved or cleared by the [place] FDA and  has been authorized for detection and/or diagnosis of SARS-CoV-2 by FDA under an Emergency Use Authorization (EUA). This EUA will remain  in effect (meaning this test can be used) for the duration of the COVID-19 declaration under Section 564(b)(1) of the Act, 21 U.S.C.section 360bbb-3(b)(1), unless the authorization is terminated  or revoked sooner.       Influenza A by PCR NEGATIVE NEGATIVE Final   Influenza B by PCR NEGATIVE NEGATIVE Final    Comment: (NOTE) The Xpert Xpress SARS-CoV-2/FLU/RSV plus assay is intended as an aid in the diagnosis of influenza from Nasopharyngeal swab specimens and should not be used as a sole basis for treatment. Nasal washings and aspirates are unacceptable for Xpert Xpress  SARS-CoV-2/FLU/RSV testing.  Fact Sheet for Patients: EntrepreneurPulse.com.au  Fact Sheet for Healthcare Providers: IncredibleEmployment.be  This test is not yet approved or cleared by the [place] FDA and has been authorized for detection and/or diagnosis of SARS-CoV-2 by FDA under an Emergency Use Authorization (EUA). This EUA will remain in effect (meaning this test can be used) for the duration of the COVID-19 declaration under Section 564(b)(1) of the Act, 21 U.S.C. section 360bbb-3(b)(1), unless the authorization is terminated or revoked.  Performed at [facility], [place],  [place], [place] [address]   Blood Culture (routine x 2)     Status: None (Preliminary result)   Collection Time: 12/13/20 11:41 PM   Specimen: BLOOD RIGHT FOREARM  Result Value Ref Range Status   Specimen Description BLOOD RIGHT FOREARM  Final   Special Requests   Final    BOTTLES DRAWN AEROBIC AND ANAEROBIC Blood Culture adequate volume   Culture   Final    NO GROWTH < 12 HOURS Performed at [facility], [address] [place], [place] [address]    Report Status PENDING  Incomplete  Blood Culture (routine x 2)     Status: None (Preliminary result)   Collection Time: 12/13/20 11:41 PM   Specimen: BLOOD RIGHT HAND  Result Value Ref Range Status   Specimen Description BLOOD RIGHT HAND  Final   Special Requests   Final    BOTTLES DRAWN AEROBIC AND ANAEROBIC Blood Culture adequate volume   Culture   Final    NO GROWTH < 12 HOURS Performed at [facility], [address], [place], [place] [address]    Report Status PENDING  Incomplete    Radiologic Imaging: CT ABDOMEN PELVIS W CONTRAST  Result Date: 12/14/2020 CLINICAL DATA:  Abdominal pain, bloating EXAM: CT ABDOMEN AND PELVIS WITH CONTRAST TECHNIQUE: Multidetector CT imaging of the abdomen and pelvis was performed using the standard protocol following bolus  administration of intravenous contrast. CONTRAST:  75m OMNIPAQUE IOHEXOL 350 MG/ML SOLN COMPARISON:  None. FINDINGS: Lower chest: Mild patchy bibasilar opacities, likely atelectasis. Hepatobiliary: Liver is within normal limits. Gallbladder is unremarkable. No intrahepatic or extrahepatic ductal dilatation. Pancreas: Within normal limits. Spleen: Within normal limits. Adrenals/Urinary Tract: Adrenal glands are within normal limits. 5.0 cm right lower pole renal cyst (series 2/image 35). Additional 2.0 cm right upper pole renal cyst (series 2/image 19). Left kidney is within normal limits. Mild bilateral hydroureteronephrosis. Bladder decompressed by an indwelling Foley catheter, poorly visualized due to streak artifact. Stomach/Bowel: Surgical clips at the GE junction. No evidence of bowel obstruction. Normal appendix (series 2/image 63). Mild rectal stool burden. Vascular/Lymphatic: No evidence of abdominal aortic aneurysm. Atherosclerotic calcifications of the abdominal aorta and branch vessels. No suspicious abdominopelvic lymphadenopathy. Reproductive: Prostate is unremarkable. Other: No abdominopelvic ascites. Musculoskeletal: Degenerative changes of the visualized thoracolumbar spine. Right hip hemiarthroplasty. IMPRESSION: No evidence of bowel obstruction. Normal appendix. Mild rectal stool burden. Mild bilateral hydroureteronephrosis. This appearance is nonspecific but may be related to chronic bladder outlet obstruction. Bladder decompressed by an indwelling Foley catheter. Additional ancillary findings as above. Electronically Signed   By: SJulian HyM.D.   On: 12/14/2020 01:30   Assessment & Plan:  87year old male with a recent history of urinary retention after a fall requiring hip surgery, now readmitted with recurrent urinary retention with some residual bilateral hydronephrosis as well as sepsis from likely urinary source.  I irrigated the patient's catheter at the bedside today.   Patient's catheter irrigated easily with return of cloudy urine and no clot material.  Agree with antibiotic treatment for urosepsis.  We will plan for outpatient voiding trial in 1 to 2 weeks.  Recommendations: -Continue antibiotics and follow urine cultures.  He will require a total of 10 to 14 days of culture appropriate therapy -Continue Foley catheter -Outpatient voiding trial in 1 to 2 weeks  Thank you for involving me in this patient's care, please page with any further questions or concerns.  SDebroah Loop PA-C 12/14/2020 11:51 AM

## 2020-12-14 NOTE — Progress Notes (Signed)
Sepsis tracking by eLINK 

## 2020-12-15 LAB — COMPREHENSIVE METABOLIC PANEL
ALT: 11 U/L (ref 0–44)
AST: 15 U/L (ref 15–41)
Albumin: 2.5 g/dL — ABNORMAL LOW (ref 3.5–5.0)
Alkaline Phosphatase: 71 U/L (ref 38–126)
Anion gap: 5 (ref 5–15)
BUN: 25 mg/dL — ABNORMAL HIGH (ref 8–23)
CO2: 26 mmol/L (ref 22–32)
Calcium: 7.8 mg/dL — ABNORMAL LOW (ref 8.9–10.3)
Chloride: 103 mmol/L (ref 98–111)
Creatinine, Ser: 1.43 mg/dL — ABNORMAL HIGH (ref 0.61–1.24)
GFR, Estimated: 46 mL/min — ABNORMAL LOW (ref >60)
Glucose, Bld: 97 mg/dL (ref 70–99)
Potassium: 4.7 mmol/L (ref 3.5–5.1)
Sodium: 134 mmol/L — ABNORMAL LOW (ref 135–145)
Total Bilirubin: 1 mg/dL (ref 0.3–1.2)
Total Protein: 5.6 g/dL — ABNORMAL LOW (ref 6.5–8.1)

## 2020-12-15 LAB — CBC
HCT: 27.7 % — ABNORMAL LOW (ref 39.0–52.0)
Hemoglobin: 9 g/dL — ABNORMAL LOW (ref 13.0–17.0)
MCH: 31.7 pg (ref 26.0–34.0)
MCHC: 32.5 g/dL (ref 30.0–36.0)
MCV: 97.5 fL (ref 80.0–100.0)
Platelets: 536 10*3/uL — ABNORMAL HIGH (ref 150–400)
RBC: 2.84 MIL/uL — ABNORMAL LOW (ref 4.22–5.81)
RDW: 12.4 % (ref 11.5–15.5)
WBC: 19.2 10*3/uL — ABNORMAL HIGH (ref 4.0–10.5)
nRBC: 0 % (ref 0.0–0.2)

## 2020-12-15 LAB — PROCALCITONIN: Procalcitonin: 0.71 ng/mL

## 2020-12-15 LAB — C-REACTIVE PROTEIN: CRP: 17.2 mg/dL — ABNORMAL HIGH (ref <1.0)

## 2020-12-15 LAB — BRAIN NATRIURETIC PEPTIDE: B Natriuretic Peptide: 512 pg/mL — ABNORMAL HIGH (ref 0.0–100.0)

## 2020-12-15 MED ORDER — SODIUM CHLORIDE 0.9 % IV SOLN: INTRAVENOUS | Status: DC

## 2020-12-15 MED ORDER — ONDANSETRON HCL 4 MG/2ML IJ SOLN
4.0000 mg | Freq: Four times a day (QID) | INTRAMUSCULAR | Status: DC | PRN
Start: 2020-12-15 — End: 2020-12-18

## 2020-12-15 MED ORDER — OXYCODONE HCL 5 MG PO TABS: 2.5000 mg | ORAL_TABLET | Freq: Four times a day (QID) | ORAL | Status: DC | PRN

## 2020-12-15 MED ORDER — ACETAMINOPHEN 500 MG PO TABS
500.0000 mg | ORAL_TABLET | Freq: Four times a day (QID) | ORAL | Status: DC | PRN
  Administered 2020-12-17: 500 mg via ORAL
  Filled 2020-12-15: qty 1

## 2020-12-15 NOTE — Progress Notes (Signed)
SLP Cancellation Note  Patient Details Name: SMARAN BEE MRN: [id number] DOB: 1927/11/26   Cancelled treatment:       Reason Eval/Treat Not Completed: SLP screened, no needs identified, will sign off  Pt's daughter present at bedside and doesn't report any overt s/s of dysphagia or aspiration with consumption of food/liquid. Pt just finishing his breakfast and was not displaying any s/s concerning for aspiration.   Treyvon Blahut B. Rutherford Nail M.S., CCC-SLP, [place] Office [phone]  Stormy Fabian 12/15/2020, 9:28 AM

## 2020-12-15 NOTE — Progress Notes (Signed)
PROGRESS NOTE        PATIENT DETAILS Name: Jacob Moses Age: 87 y.o. Sex: male Date of Birth: 03-Jun-1927 Admit Date: 12/13/2020 Admitting Physician Evalee Mutton Kristeen Mans, MD [id number], Leona Carry, MD  Brief Narrative: Patient is a 87 y.o. male with history of HTN, hearing loss, vertigo-who was seen by his urologist a day prior to this hospitalization for Foley catheter removal-presented to the hospital with abdominal pain-he was found to have sepsis due to complicated UTI and acute urinary retention.  See below for further details.  Significant events: 7/28-8/1>> hospitalization for right hip fracture-discharged to SNF.  Foley catheter apparently inserted at SNF. 8/10>> Foley catheter removed by urology as outpatient 8/11>> to [facility] ED with fever/abdominal pain-found to have acute urinary retention-sepsis due to UTI  Significant studies: 8/11>> CXR: No pneumonia 8/12>> CT abdomen/pelvis: No SBO-mild stool burden.  Mild bilateral hydroureteronephrosis.  Antimicrobial therapy: Cefepime: 8/11>> Vancomycin: 8/11 x 1 Flagyl: 8/11 x 1  Microbiology data: 8/11>> urine culture: Pending 8/11>> blood culture: Negative so far  Procedures : 8/11>> Foley catheter reinserted in the emergency room.  Consults: None  DVT Prophylaxis : enoxaparin (LOVENOX) injection 30 mg Start: 12/14/20 0230   Subjective: Patient in bed, appears comfortable, denies any headache, no fever, no chest pain or pressure, no shortness of breath , no abdominal pain. No new focal weakness.    Assessment/Plan: Sepsis due to complicated UTI (related to recent Foley catheter insertion): Sepsis physiology improving-continue cefepime-await culture data.  Prelim cultures noted however clinically has responded well to empiric treatment.  We will continue to monitor.  Acute urinary retention: Probably has underlying BPH-voiding trial at urologist office on 8/10 was successful.  We will start  Flomax-admitting MD consulted urology-we will await evaluation (previous MD spoke with Dr. Jeffie Pollock over the phone on 12/14/2020).  Hematuria: Either from UTI or from BPH.  Hopefully will improve with antimicrobial therapy.  We will add finasteride in case this is from BPH.  No structural abnormality seen on imaging.  Await urology input.  AKI on CKD stage IIIb Creatinine close to 1.5: AKI due to sepsis physiology and urinary retention.  Improving with supportive care-avoid nephrotoxic agents.  Follow creatinine trend.  HTN: BP controlled-continue amlodipine  BPH: See above  Recent right hip fracture: Continue PT/OT-on prophylactic Lovenox.  Diet: Diet Order             Diet Heart Room service appropriate? Yes; Fluid consistency: Thin  Diet effective now                    Code Status:  DNR-reconfirmed with daughter [name] on 8/12. Daughter bedside 12/15/20  Family Communication: Spoke with daughter-[phone] over phone on 8/12  Disposition Plan: Status is: Inpatient  Remains inpatient appropriate because:Inpatient level of care appropriate due to severity of illness  Dispo: The patient is from: SNF              Anticipated d/c is to: SNF              Patient currently is not medically stable to d/c.   Difficult to place patient No   Barriers to Discharge: Resolving sepsis physiology-on IV antibiotics-not yet stable for discharge.  Antimicrobial agents: Anti-infectives (From admission, onward)    Start     Dose/Rate Route Frequency Ordered Stop   12/14/20 2200  ceFEPIme (MAXIPIME) 2 g in sodium chloride 0.9 % 100 mL IVPB        2 g 200 mL/hr over 30 Minutes Intravenous Every 24 hours 12/14/20 0229 12/22/20 2359   12/13/20 2330  ceFEPIme (MAXIPIME) 2 g in sodium chloride 0.9 % 100 mL IVPB        2 g 200 mL/hr over 30 Minutes Intravenous  Once 12/13/20 2319 12/14/20 0039   12/13/20 2330  metroNIDAZOLE (FLAGYL) IVPB 500 mg        500 mg 100 mL/hr over 60  Minutes Intravenous  Once 12/13/20 2319 12/14/20 0348   12/13/20 2330  vancomycin (VANCOCIN) IVPB 1000 mg/200 mL premix  Status:  Discontinued        1,000 mg 200 mL/hr over 60 Minutes Intravenous  Once 12/13/20 2319 12/13/20 2326   12/13/20 2330  vancomycin (VANCOREADY) IVPB 1250 mg/250 mL        1,250 mg 166.7 mL/hr over 90 Minutes Intravenous  Once 12/13/20 2326 12/14/20 0239        Time spent: 35 minutes-Greater than 50% of this time was spent in counseling, explanation of diagnosis, planning of further management, and coordination of care.  MEDICATIONS: Scheduled Meds:  ALPRAZolam  0.5 mg Oral Daily   amLODipine  5 mg Oral Daily   Chlorhexidine Gluconate Cloth  6 each Topical Daily   enoxaparin  30 mg Subcutaneous Q24H   finasteride  5 mg Oral Daily   multivitamin with minerals  1 tablet Oral Daily   polyethylene glycol  17 g Oral Daily   tamsulosin  0.4 mg Oral Daily   Continuous Infusions:  sodium chloride 50 mL/hr at 12/15/20 0742   ceFEPime (MAXIPIME) IV 2 g (12/14/20 2250)   PRN Meds:.acetaminophen, ALPRAZolam, bisacodyl, ondansetron (ZOFRAN) IV, oxyCODONE, traMADol, traZODone   PHYSICAL EXAM: Vital signs: Vitals:   12/15/20 0248 12/15/20 0729 12/15/20 1016 12/15/20 1124  BP: 123/62 (!) 111/51 (!) 101/57 110/69  Pulse: 70 71 65 73  Resp: '18 16  16  '$ Temp: 99 F (37.2 C) 99.4 F (37.4 C)  97.9 F (36.6 C)  TempSrc:      SpO2: 97% 99%  96%  Weight:      Height:       Filed Weights   12/14/20 0141  Weight: 59 kg   Body mass index is 18.13 kg/m.   Exam:  Awake Alert, No new F.N deficits, Normal affect [place].AT,PERRAL Supple Neck,No JVD, No cervical lymphadenopathy appriciated.  Symmetrical Chest wall movement, Good air movement bilaterally, CTAB RRR,No Gallops, Rubs or new Murmurs, No Parasternal Heave +ve B.Sounds, Abd Soft, No tenderness, No organomegaly appriciated, No rebound - guarding or rigidity. No Cyanosis, Clubbing or edema, No new Rash or  bruise   I have personally reviewed following labs and imaging studies  LABORATORY DATA:  Recent Labs  Lab 12/13/20 2341 12/14/20 0705 12/15/20 0536  WBC 27.9* 28.3* 19.2*  HGB 11.4* 9.0* 9.0*  HCT 33.9* 27.4* 27.7*  PLT 747* 531* 536*  MCV 97.4 98.6 97.5  MCH 32.8 32.4 31.7  MCHC 33.6 32.8 32.5  RDW 12.2 12.4 12.4  LYMPHSABS 0.7 1.4  --   MONOABS 1.6* 1.8*  --   EOSABS 0.1 0.0  --   BASOSABS 0.1 0.1  --     Recent Labs  Lab 12/13/20 2341 12/14/20 0705 12/15/20 0536  NA 131* 132* 134*  K 5.3* 5.3* 4.7  CL 95* 100 103  CO2 '28 25 26  '$ GLUCOSE 147* 107*  97  BUN 34* 30* 25*  CREATININE 1.61* 1.42* 1.43*  CALCIUM 8.6* 7.8* 7.8*  AST '24 17 15  '$ ALT '17 11 11  '$ ALKPHOS 100 67 71  BILITOT 0.9 0.9 1.0  ALBUMIN 3.6 2.4* 2.5*  PROCALCITON <0.10  --   --   LATICACIDVEN 1.5 1.0  --   INR 1.3* 1.2  --          RADIOLOGY STUDIES/RESULTS: CT ABDOMEN PELVIS W CONTRAST  Result Date: 12/14/2020 CLINICAL DATA:  Abdominal pain, bloating EXAM: CT ABDOMEN AND PELVIS WITH CONTRAST TECHNIQUE: Multidetector CT imaging of the abdomen and pelvis was performed using the standard protocol following bolus administration of intravenous contrast. CONTRAST:  11m OMNIPAQUE IOHEXOL 350 MG/ML SOLN COMPARISON:  None. FINDINGS: Lower chest: Mild patchy bibasilar opacities, likely atelectasis. Hepatobiliary: Liver is within normal limits. Gallbladder is unremarkable. No intrahepatic or extrahepatic ductal dilatation. Pancreas: Within normal limits. Spleen: Within normal limits. Adrenals/Urinary Tract: Adrenal glands are within normal limits. 5.0 cm right lower pole renal cyst (series 2/image 35). Additional 2.0 cm right upper pole renal cyst (series 2/image 19). Left kidney is within normal limits. Mild bilateral hydroureteronephrosis. Bladder decompressed by an indwelling Foley catheter, poorly visualized due to streak artifact. Stomach/Bowel: Surgical clips at the GE junction. No evidence of bowel  obstruction. Normal appendix (series 2/image 63). Mild rectal stool burden. Vascular/Lymphatic: No evidence of abdominal aortic aneurysm. Atherosclerotic calcifications of the abdominal aorta and branch vessels. No suspicious abdominopelvic lymphadenopathy. Reproductive: Prostate is unremarkable. Other: No abdominopelvic ascites. Musculoskeletal: Degenerative changes of the visualized thoracolumbar spine. Right hip hemiarthroplasty. IMPRESSION: No evidence of bowel obstruction. Normal appendix. Mild rectal stool burden. Mild bilateral hydroureteronephrosis. This appearance is nonspecific but may be related to chronic bladder outlet obstruction. Bladder decompressed by an indwelling Foley catheter. Additional ancillary findings as above. Electronically Signed   By: SJulian HyM.D.   On: 12/14/2020 01:30   DG Chest Port 1 View  Result Date: 12/13/2020 CLINICAL DATA:  Bloating no bowel movement EXAM: PORTABLE CHEST 1 VIEW COMPARISON:  11/29/2020 FINDINGS: The heart size and mediastinal contours are within normal limits. Both lungs are clear. The visualized skeletal structures are unremarkable. IMPRESSION: No active disease. Electronically Signed   By: KDonavan FoilM.D.   On: 12/13/2020 23:35     LOS: 1 day   Signature  PLala LundM.D on 12/15/2020 at 12:20 PM   -  To page go to [email or web address]

## 2020-12-15 NOTE — Evaluation (Signed)
Physical Therapy Evaluation Patient Details Name: Jacob Moses MRN: [id number] DOB: 1927-08-16 Today's Date: 12/15/2020   History of Present Illness  87 y.o. male with history of HTN, hearing loss, vertigo-who was seen by his urologist a day prior to this hospitalization for Foley catheter removal-presented to the hospital with abdominal pain-he was found to have sepsis due to complicated UTI and acute urinary retention. He was also admitted to hospital end of july for R hip hemiarthroplasty.    Clinical Impression  Pt received seated in recliner upon arrival to room.  Pt agreeable to therapy.  Pt able to perform mobility into standing with minA under the arm for support.  Pt then ambulated to the nursing station and back to the room.  Pt with noticeable fatigue setting in, however was able to complete bout without any rest breaks.  Pt does require verbal cuing for keeping the R LE externally rotated, however he notes it's difficult and that he feels as though his surgeon messed it up.  Pt then transferred back to the recliner with daughter in room.  Pt was cleaned due to bowel movement occurring during ambulation and pt was noted to have wound on buttocks.  Daughter aware of the wound, however thought it was covered.  All needs met and chair alarm was set.  Pt will benefit from skilled PT intervention to increase independence and safety with basic mobility in preparation for discharge to the venue listed below.         Follow Up Recommendations SNF;Supervision/Assistance - 24 hour    Equipment Recommendations  Rolling walker with 5" wheels    Recommendations for Other Services       Precautions / Restrictions Precautions Precautions: Fall;Posterior Hip Precaution Booklet Issued: No Precaution Comments: Abduction pillow Restrictions Weight Bearing Restrictions: No Other Position/Activity Restrictions: Asked daughter to bring abduction pillow. OT made one to place today in ED to  maintain precautions.      Mobility  Bed Mobility Overal bed mobility: Needs Assistance             General bed mobility comments: pt upright in recliner upon arrival.    Transfers Overall transfer level: Needs assistance Equipment used: Rolling walker (2 wheeled) Transfers: Sit to/from Stand Sit to Stand: Min assist         General transfer comment: Pt requires minA from therapist to come upright.  Ambulation/Gait Ambulation/Gait assistance: Min guard Gait Distance (Feet): 80 Feet Assistive device: Rolling walker (2 wheeled) Gait Pattern/deviations: Decreased step length - right;Decreased step length - left;Decreased dorsiflexion - right;Decreased dorsiflexion - left;Decreased stride length;Decreased weight shift to right;Decreased weight shift to left;Trunk flexed Gait velocity: decreased   General Gait Details: Pt tends to have IR of the R LE and is able to correct when verbally cued, but pt reports it feels unnatural to him and believes the surgeon messed his leg up.  Stairs            Wheelchair Mobility    Modified Rankin (Stroke Patients Only)       Balance Overall balance assessment: Needs assistance Sitting-balance support: Feet supported Sitting balance-Leahy Scale: Good     Standing balance support: Bilateral upper extremity supported;During functional activity Standing balance-Leahy Scale: Poor                               Pertinent Vitals/Pain Pain Assessment: Faces Faces Pain Scale: Hurts a little bit Pain Location:  R LE with movement Pain Descriptors / Indicators: Grimacing;Sore;Guarding Pain Intervention(s): Limited activity within patient's tolerance;Monitored during session;Repositioned    Home Living Family/patient expects to be discharged to:: Skilled nursing facility Living Arrangements: Alone Available Help at Discharge: Family;Available PRN/intermittently Type of Home: House Home Access: Stairs to enter    Entrance Stairs-Number of Steps: 1 Home Layout: Two level;Able to live on main level with bedroom/bathroom Home Equipment: Gilford Rile - 2 wheels      Prior Function Level of Independence: Independent         Comments: walks at the mall, drives. Dtr does endorse h/o some falls, was adm 2YA for fall     Hand Dominance   Dominant Hand: Right    Extremity/Trunk Assessment        Lower Extremity Assessment Lower Extremity Assessment: Generalized weakness;RLE deficits/detail RLE Deficits / Details: some (expected) generally decreased tolerance for RLE ROM.  Pt also has increased IR of the R LE when ambulating and in standing. RLE Coordination: decreased gross motor    Cervical / Trunk Assessment Cervical / Trunk Assessment: Normal  Communication   Communication: HOH  Cognition Arousal/Alertness: Awake/alert Behavior During Therapy: WFL for tasks assessed/performed Overall Cognitive Status: Within Functional Limits for tasks assessed                                 General Comments: Pt follows 1 step commands and is pleasant and cooperative. A & O x4.      General Comments      Exercises     Assessment/Plan    PT Assessment Patient needs continued PT services  PT Problem List Decreased strength;Decreased mobility;Decreased safety awareness;Decreased knowledge of precautions;Decreased coordination;Decreased activity tolerance;Decreased balance;Decreased knowledge of use of DME;Pain;Decreased range of motion       PT Treatment Interventions DME instruction;Therapeutic exercise;Gait training;Balance training;Stair training;Neuromuscular re-education;Functional mobility training;Therapeutic activities;Patient/family education    PT Goals (Current goals can be found in the Care Plan section)  Acute Rehab PT Goals Patient Stated Goal: return to rehab PT Goal Formulation: With patient Time For Goal Achievement: 12/29/20 Potential to Achieve Goals: Fair     Frequency 7X/week   Barriers to discharge Decreased caregiver support      Co-evaluation               AM-PAC PT "6 Clicks" Mobility  Outcome Measure Help needed turning from your back to your side while in a flat bed without using bedrails?: A Little Help needed moving from lying on your back to sitting on the side of a flat bed without using bedrails?: A Little Help needed moving to and from a bed to a chair (including a wheelchair)?: A Little Help needed standing up from a chair using your arms (e.g., wheelchair or bedside chair)?: A Little Help needed to walk in hospital room?: A Little Help needed climbing 3-5 steps with a railing? : A Lot 6 Click Score: 17    End of Session Equipment Utilized During Treatment: Gait belt Activity Tolerance: Patient limited by fatigue Patient left: with call bell/phone within reach;with family/visitor present;in chair;with chair alarm set Nurse Communication: Mobility status PT Visit Diagnosis: Unsteadiness on feet (R26.81);History of falling (Z91.81);Muscle weakness (generalized) (M62.81);Other abnormalities of gait and mobility (R26.89);Difficulty in walking, not elsewhere classified (R26.2);Pain Pain - Right/Left: Right Pain - part of body: Leg;Hip    Time: 1751-0258 PT Time Calculation (min) (ACUTE ONLY): 41 min   Charges:  PT Evaluation $PT Eval Low Complexity: 1 Low PT Treatments $Gait Training: 23-37 mins        Gwenlyn Saran, PT, DPT 12/15/20, 3:10 PM   Christie Nottingham 12/15/2020, 3:05 PM

## 2020-12-16 LAB — CBC WITH DIFFERENTIAL/PLATELET
Abs Immature Granulocytes: 0.08 10*3/uL — ABNORMAL HIGH (ref 0.00–0.07)
Basophils Absolute: 0 10*3/uL (ref 0.0–0.1)
Basophils Relative: 0 %
Eosinophils Absolute: 0.5 10*3/uL (ref 0.0–0.5)
Eosinophils Relative: 4 %
HCT: 25 % — ABNORMAL LOW (ref 39.0–52.0)
Hemoglobin: 8.1 g/dL — ABNORMAL LOW (ref 13.0–17.0)
Immature Granulocytes: 1 %
Lymphocytes Relative: 9 %
Lymphs Abs: 1.1 10*3/uL (ref 0.7–4.0)
MCH: 32.3 pg (ref 26.0–34.0)
MCHC: 32.4 g/dL (ref 30.0–36.0)
MCV: 99.6 fL (ref 80.0–100.0)
Monocytes Absolute: 0.9 10*3/uL (ref 0.1–1.0)
Monocytes Relative: 7 %
Neutro Abs: 9.6 10*3/uL — ABNORMAL HIGH (ref 1.7–7.7)
Neutrophils Relative %: 79 %
Platelets: 463 10*3/uL — ABNORMAL HIGH (ref 150–400)
RBC: 2.51 MIL/uL — ABNORMAL LOW (ref 4.22–5.81)
RDW: 12.5 % (ref 11.5–15.5)
WBC: 12.2 10*3/uL — ABNORMAL HIGH (ref 4.0–10.5)
nRBC: 0 % (ref 0.0–0.2)

## 2020-12-16 LAB — COMPREHENSIVE METABOLIC PANEL
ALT: 10 U/L (ref 0–44)
AST: 15 U/L (ref 15–41)
Albumin: 2.4 g/dL — ABNORMAL LOW (ref 3.5–5.0)
Alkaline Phosphatase: 69 U/L (ref 38–126)
Anion gap: 6 (ref 5–15)
BUN: 19 mg/dL (ref 8–23)
CO2: 26 mmol/L (ref 22–32)
Calcium: 7.9 mg/dL — ABNORMAL LOW (ref 8.9–10.3)
Chloride: 104 mmol/L (ref 98–111)
Creatinine, Ser: 1.28 mg/dL — ABNORMAL HIGH (ref 0.61–1.24)
GFR, Estimated: 53 mL/min — ABNORMAL LOW (ref >60)
Glucose, Bld: 97 mg/dL (ref 70–99)
Potassium: 4.2 mmol/L (ref 3.5–5.1)
Sodium: 136 mmol/L (ref 135–145)
Total Bilirubin: 0.7 mg/dL (ref 0.3–1.2)
Total Protein: 5.5 g/dL — ABNORMAL LOW (ref 6.5–8.1)

## 2020-12-16 LAB — URINE CULTURE: Culture: >=100000 — AB

## 2020-12-16 LAB — PROCALCITONIN: Procalcitonin: 0.49 ng/mL

## 2020-12-16 LAB — MAGNESIUM: Magnesium: 2.5 mg/dL — ABNORMAL HIGH (ref 1.7–2.4)

## 2020-12-16 LAB — BRAIN NATRIURETIC PEPTIDE: B Natriuretic Peptide: 495.5 pg/mL — ABNORMAL HIGH (ref 0.0–100.0)

## 2020-12-16 MED ORDER — ENOXAPARIN SODIUM 40 MG/0.4ML IJ SOSY
40.0000 mg | PREFILLED_SYRINGE | INTRAMUSCULAR | Status: DC
  Administered 2020-12-17 – 2020-12-18 (×2): 40 mg via SUBCUTANEOUS
  Filled 2020-12-16 (×2): qty 0.4

## 2020-12-16 MED ORDER — SULFAMETHOXAZOLE-TRIMETHOPRIM 800-160 MG PO TABS
1.0000 | ORAL_TABLET | Freq: Two times a day (BID) | ORAL | Status: DC
  Administered 2020-12-16 – 2020-12-18 (×5): 1 via ORAL
  Filled 2020-12-16 (×5): qty 1

## 2020-12-16 MED ORDER — OXYCODONE HCL 5 MG PO TABS
2.5000 mg | ORAL_TABLET | Freq: Four times a day (QID) | ORAL | Status: DC | PRN
  Administered 2020-12-16 – 2020-12-17 (×2): 2.5 mg via ORAL
  Filled 2020-12-16 (×3): qty 1

## 2020-12-16 NOTE — Progress Notes (Signed)
PHARMACIST - PHYSICIAN COMMUNICATION  CONCERNING:  Enoxaparin (Lovenox) for DVT Prophylaxis    RECOMMENDATION: Patient was prescribed enoxaprin '30mg'$  q24 hours for VTE prophylaxis for CrCl <30  Filed Weights   12/14/20 0141  Weight: 59 kg (130 lb)    Body mass index is 18.13 kg/m.  Estimated Creatinine Clearance: 31.4 mL/min (A) (by C-G formula based on SCr of 1.28 mg/dL (H)).   Patient is candidate for enoxaparin '40mg'$  every 24 hours based on CrCl >53m/min  DESCRIPTION: [facility]  Patient is now receiving enoxaparin 40 mg every 24 hours   SPernell Dupre PharmD, BCPS Clinical Pharmacist 12/16/2020 9:24 AM

## 2020-12-16 NOTE — NC FL2 (Signed)
[place] LEVEL OF CARE SCREENING TOOL     IDENTIFICATION  Patient Name: Jacob Moses Birthdate: 1928-02-10 Sex: male Admission Date (Current Location): 12/13/2020  [facility] and [place] Number:  Engineering geologist and Address:  [facility], [address] [place], [place] [address]      Provider Number: [id number]  Attending Physician Name and Address:  Thurnell Lose, MD  Relative Name and Phone Number:  [name] daughter [phone]    Current Level of Care: Hospital Recommended Level of Care: [place] Prior Approval Number:    Date Approved/Denied:   PASRR Number:    Discharge Plan: SNF    Current Diagnoses: Patient Active Problem List   Diagnosis Date Noted   Sepsis due to gram-negative UTI ([place]) 12/14/2020   Sepsis ([place]) 12/14/2020   Closed right hip fracture ([place]) 11/29/2020   Fall at home, initial encounter 11/29/2020   Bradycardia 11/29/2020   HTN (hypertension) 11/29/2020   GERD (gastroesophageal reflux disease) 11/29/2020   Anxiety 11/29/2020   CKD (chronic kidney disease), stage IIIb 11/29/2020   Iron deficiency anemia 11/29/2020   Protein-calorie malnutrition, severe 02/24/2019   SDH (subdural hematoma) ([place]) 02/22/2019    Orientation RESPIRATION BLADDER Height & Weight     Self, Time, Situation, Place  Normal Continent Weight: 130 lb (59 kg) Height:  '5\' 11"'$  (180.3 cm)  BEHAVIORAL SYMPTOMS/MOOD NEUROLOGICAL BOWEL NUTRITION STATUS      Continent Diet (regular)  AMBULATORY STATUS COMMUNICATION OF NEEDS Skin   Extensive Assist Verbally Normal, Surgical wounds                       Personal Care Assistance Level of Assistance  Bathing, Dressing, Feeding           Functional Limitations Info             SPECIAL CARE FACTORS FREQUENCY  PT (By licensed PT), OT (By licensed OT)                    Contractures Contractures Info: Not present    Additional Factors  Info  Code Status, Allergies               Current Medications (12/16/2020):  This is the current hospital active medication list Current Facility-Administered Medications  Medication Dose Route Frequency Provider Last Rate Last Admin   acetaminophen (TYLENOL) tablet 500 mg  500 mg Oral Q6H PRN Thurnell Lose, MD       ALPRAZolam (XANAX XR) 24 hr tablet 0.5 mg  0.5 mg Oral Daily Mansy, Jan A, MD   0.5 mg at 12/15/20 2200   ALPRAZolam (XANAX) tablet 0.25 mg  0.25 mg Oral QHS PRN Jonetta Osgood, MD   0.25 mg at 12/14/20 2254   amLODipine (NORVASC) tablet 5 mg  5 mg Oral Daily Mansy, Jan A, MD   5 mg at 12/16/20 0935   bisacodyl (DULCOLAX) suppository 10 mg  10 mg Rectal Daily PRN Jonetta Osgood, MD   10 mg at 12/14/20 1749   Chlorhexidine Gluconate Cloth 2 % PADS 6 each  6 each Topical Daily Jonetta Osgood, MD   6 each at 12/15/20 1000   [START ON 12/17/2020] enoxaparin (LOVENOX) injection 40 mg  40 mg Subcutaneous Q24H Hallaji, Sheema M, RPH       finasteride (PROSCAR) tablet 5 mg  5 mg Oral Daily Ghimire, Henreitta Leber, MD   5 mg at  12/16/20 0935   multivitamin with minerals tablet 1 tablet  1 tablet Oral Daily Mansy, Jan A, MD   1 tablet at 12/16/20 0935   ondansetron (ZOFRAN) injection 4 mg  4 mg Intravenous Q6H PRN Thurnell Lose, MD       oxyCODONE (Oxy IR/ROXICODONE) immediate release tablet 2.5 mg  2.5 mg Oral Q6H PRN Thurnell Lose, MD       polyethylene glycol (MIRALAX / GLYCOLAX) packet 17 g  17 g Oral Daily Jonetta Osgood, MD   17 g at 12/15/20 1016   sulfamethoxazole-trimethoprim (BACTRIM DS) 800-160 MG per tablet 1 tablet  1 tablet Oral Q12H Thurnell Lose, MD       tamsulosin (FLOMAX) capsule 0.4 mg  0.4 mg Oral Daily Jonetta Osgood, MD   0.4 mg at 12/16/20 0935   traMADol (ULTRAM) tablet 50 mg  50 mg Oral Q6H PRN Mansy, Jan A, MD   50 mg at 12/14/20 1847   traZODone (DESYREL) tablet 25 mg  25 mg Oral QHS PRN Mansy, Jan A, MD   25 mg at 12/14/20 2245      Discharge Medications: Please see discharge summary for a list of discharge medications.  Relevant Imaging Results:  Relevant Lab Results:   Additional Information SS#: [id number]  Boris Sharper, LCSW

## 2020-12-16 NOTE — TOC Initial Note (Signed)
Transition of Care [facility]) - Initial/Assessment Note    Patient Details  Name: Jacob Moses MRN: [id number] Date of Birth: Aug 01, 1927  Transition of Care [facility]) CM/SW Contact:    Boris Sharper, LCSW Phone Number: 12/16/2020, 11:07 AM  Clinical Narrative:                 CSW reached out to pt and family regarding discharge plans. Pt and family were all in agreement to pt going to SNF. Pt would prefer not to go back to [facility] but will if that is his only option. CSW faxed FL2 out to surrounding facilities.   Expected Discharge Plan: Skilled Nursing Facility Barriers to Discharge: Continued Medical Work up   Patient Goals and CMS Choice Patient states their goals for this hospitalization and ongoing recovery are:: to continue rehab after discharge CMS Medicare.gov Compare Post Acute Care list provided to:: Patient Choice offered to / list presented to : Patient, Adult Children  Expected Discharge Plan and Services Expected Discharge Plan: [place] Acute Care Choice: [place] Living arrangements for the past 2 months: [place]                                      Prior Living Arrangements/Services Living arrangements for the past 2 months: [place] Lives with:: Facility Resident Patient language and need for interpreter reviewed:: Yes        Need for Family Participation in Patient Care: Yes (Comment) Care giver support system in place?: Yes (comment)   Criminal Activity/Legal Involvement Pertinent to Current Situation/Hospitalization: No - Comment as needed  Activities of Daily Living Home Assistive Devices/Equipment: Cane (specify quad or straight), Walker (specify type) ADL Screening (condition at time of admission) Patient's cognitive ability adequate to safely complete daily activities?: Yes Is the patient deaf or have difficulty hearing?: No Does the patient have difficulty seeing, even  when wearing glasses/contacts?: No Does the patient have difficulty concentrating, remembering, or making decisions?: No Patient able to express need for assistance with ADLs?: No Does the patient have difficulty dressing or bathing?: Yes Independently performs ADLs?: Yes (appropriate for developmental age) Does the patient have difficulty walking or climbing stairs?: No Weakness of Legs: None Weakness of Arms/Hands: None  Permission Sought/Granted Permission sought to share information with : Facility Art therapist granted to share information with : Yes, Verbal Permission Granted  Share Information with NAME: [name]     Permission granted to share info w Relationship: daughter  Permission granted to share info w Contact Information: [phone]  Emotional Assessment Appearance:: Other (Comment Required (unable to assess) Attitude/Demeanor/Rapport: Unable to Assess Affect (typically observed): Unable to Assess Orientation: : Oriented to Self, Oriented to Place, Oriented to  Time, Oriented to Situation Alcohol / Substance Use: Not Applicable Psych Involvement: No (comment)  Admission diagnosis:  Urinary retention [R33.9] Acute UTI [N39.0] Sepsis ([place]) [A41.9] Fever, unspecified fever cause [R50.9] Constipation, unspecified constipation type [K59.00] Sepsis due to gram-negative UTI ([place]) [A41.50, N39.0] Patient Active Problem List   Diagnosis Date Noted   Sepsis due to gram-negative UTI ([place]) 12/14/2020   Sepsis ([place]) 12/14/2020   Closed right hip fracture ([place]) 11/29/2020   Fall at home, initial encounter 11/29/2020   Bradycardia 11/29/2020   HTN (hypertension) 11/29/2020   GERD (gastroesophageal reflux disease) 11/29/2020   Anxiety 11/29/2020   CKD (chronic  kidney disease), stage IIIb 11/29/2020   Iron deficiency anemia 11/29/2020   Protein-calorie malnutrition, severe 02/24/2019   SDH (subdural hematoma) ([place]) 02/22/2019   PCP:  Albina Billet,  MD Pharmacy:   [facility], [place] [address] Phone: [phone] Fax: [phone]     Social Determinants of Health (SDOH) Interventions    Readmission Risk Interventions No flowsheet data found.

## 2020-12-16 NOTE — Progress Notes (Signed)
PROGRESS NOTE        PATIENT DETAILS Name: Jacob Moses Age: 87 y.o. Sex: male Date of Birth: 04/12/1928 Admit Date: 12/13/2020 Admitting Physician Evalee Mutton Kristeen Mans, MD [id number], Leona Carry, MD  Brief Narrative: Patient is a 87 y.o. male with history of HTN, hearing loss, vertigo-who was seen by his urologist a day prior to this hospitalization for Foley catheter removal-presented to the hospital with abdominal pain-he was found to have sepsis due to complicated UTI and acute urinary retention.  See below for further details.  Significant events: 7/28-8/1>> hospitalization for right hip fracture-discharged to SNF.  Foley catheter apparently inserted at SNF. 8/10>> Foley catheter removed by urology as outpatient 8/11>> to [facility] ED with fever/abdominal pain-found to have acute urinary retention-sepsis due to UTI  Significant studies: 8/11>> CXR: No pneumonia 8/12>> CT abdomen/pelvis: No SBO-mild stool burden.  Mild bilateral hydroureteronephrosis.  Antimicrobial therapy: Cefepime: 8/11>> Vancomycin: 8/11 x 1 Flagyl: 8/11 x 1  Microbiology data: 8/11>> urine culture: Pending 8/11>> blood culture: Negative so far  Procedures : 8/11>> Foley catheter reinserted in the emergency room.  Consults: None  DVT Prophylaxis : enoxaparin (LOVENOX) injection 40 mg Start: 12/17/20 0800   Subjective:  Patient in bed, appears comfortable, denies any headache, no fever, no chest pain or pressure, no shortness of breath , no abdominal pain. No new focal weakness.     Assessment/Plan: Sepsis due to complicated UTI (related to recent Foley catheter insertion): Sepsis physiology improved on empiric antibiotics, culture sensitivity data noted, discussed with ID Dr. Juleen China over the phone on 12/16/2020 along with Dr. Jeffie Pollock urologist.  Plan is Bactrim for 5 to 7 days with close clinical monitoring.  Acute urinary retention: Probably has underlying BPH-voiding trial at  urologist office on 8/10 was successful.  But eventually failed, Foley and Flomax continued, urology following.  Likely will be discharged with Foley catheter and Flomax.  Patient follow-up with Dr. Jeffie Pollock in 1 to 2 weeks.  Hematuria: Either from UTI or from BPH.  Resolved.  Urology following.  AKI on CKD stage IIIb Creatinine close to 1.5: AKI due to sepsis physiology and urinary retention.  Care resolved now close to baseline.  HTN: BP controlled-continue amlodipine  BPH: See above  Recent right hip fracture: Continue PT/OT-on prophylactic Lovenox.  Diet: Diet Order             Diet Heart Room service appropriate? Yes; Fluid consistency: Thin  Diet effective now                    Code Status:  DNR-reconfirmed with daughter [name] on 8/12.   Family Communication: Spoke with daughter-Donna-[phone] over phone on 8/12  Daughter bedside on 12/15/2020, 12/16/2020  Disposition Plan: Status is: Inpatient  Remains inpatient appropriate because:Inpatient level of care appropriate due to severity of illness  Dispo: The patient is from: SNF              Anticipated d/c is to: SNF              Patient currently is not medically stable to d/c.   Difficult to place patient No   Barriers to Discharge: Resolving sepsis physiology-on IV antibiotics-not yet stable for discharge.  Antimicrobial agents: Anti-infectives (From admission, onward)    Start     Dose/Rate Route Frequency Ordered Stop   12/14/20  2200  ceFEPIme (MAXIPIME) 2 g in sodium chloride 0.9 % 100 mL IVPB        2 g 200 mL/hr over 30 Minutes Intravenous Every 24 hours 12/14/20 0229 12/22/20 2359   12/13/20 2330  ceFEPIme (MAXIPIME) 2 g in sodium chloride 0.9 % 100 mL IVPB        2 g 200 mL/hr over 30 Minutes Intravenous  Once 12/13/20 2319 12/14/20 0039   12/13/20 2330  metroNIDAZOLE (FLAGYL) IVPB 500 mg        500 mg 100 mL/hr over 60 Minutes Intravenous  Once 12/13/20 2319 12/14/20 0348   12/13/20 2330   vancomycin (VANCOCIN) IVPB 1000 mg/200 mL premix  Status:  Discontinued        1,000 mg 200 mL/hr over 60 Minutes Intravenous  Once 12/13/20 2319 12/13/20 2326   12/13/20 2330  vancomycin (VANCOREADY) IVPB 1250 mg/250 mL        1,250 mg 166.7 mL/hr over 90 Minutes Intravenous  Once 12/13/20 2326 12/14/20 0239        Time spent: 35 minutes-Greater than 50% of this time was spent in counseling, explanation of diagnosis, planning of further management, and coordination of care.  MEDICATIONS: Scheduled Meds:  ALPRAZolam  0.5 mg Oral Daily   amLODipine  5 mg Oral Daily   Chlorhexidine Gluconate Cloth  6 each Topical Daily   [START ON 12/17/2020] enoxaparin  40 mg Subcutaneous Q24H   finasteride  5 mg Oral Daily   multivitamin with minerals  1 tablet Oral Daily   polyethylene glycol  17 g Oral Daily   tamsulosin  0.4 mg Oral Daily   Continuous Infusions:  ceFEPime (MAXIPIME) IV Stopped (12/15/20 2228)   PRN Meds:.acetaminophen, ALPRAZolam, bisacodyl, ondansetron (ZOFRAN) IV, oxyCODONE, traMADol, traZODone   PHYSICAL EXAM: Vital signs: Vitals:   12/15/20 1604 12/15/20 2012 12/16/20 0340 12/16/20 0807  BP: 98/77 (!) 112/56 102/66 125/66  Pulse: 67 70 72 68  Resp: '16 17 18 18  '$ Temp: 98.3 F (36.8 C) 98.6 F (37 C) 98.8 F (37.1 C) 97.6 F (36.4 C)  TempSrc:      SpO2: 97% 97% 95% 97%  Weight:      Height:       Filed Weights   12/14/20 0141  Weight: 59 kg   Body mass index is 18.13 kg/m.   Exam:  Awake Alert, No new F.N deficits, Foley in place, R hip surgery postop site stable [place].AT,PERRAL Supple Neck,No JVD, No cervical lymphadenopathy appriciated.  Symmetrical Chest wall movement, Good air movement bilaterally, CTAB RRR,No Gallops, Rubs or new Murmurs, No Parasternal Heave +ve B.Sounds, Abd Soft, No tenderness, No organomegaly appriciated, No rebound - guarding or rigidity. No Cyanosis, Clubbing or edema, No new Rash or bruise    I have personally reviewed  following labs and imaging studies  LABORATORY DATA:  Recent Labs  Lab 12/13/20 2341 12/14/20 0705 12/15/20 0536 12/16/20 0532  WBC 27.9* 28.3* 19.2* 12.2*  HGB 11.4* 9.0* 9.0* 8.1*  HCT 33.9* 27.4* 27.7* 25.0*  PLT 747* 531* 536* 463*  MCV 97.4 98.6 97.5 99.6  MCH 32.8 32.4 31.7 32.3  MCHC 33.6 32.8 32.5 32.4  RDW 12.2 12.4 12.4 12.5  LYMPHSABS 0.7 1.4  --  1.1  MONOABS 1.6* 1.8*  --  0.9  EOSABS 0.1 0.0  --  0.5  BASOSABS 0.1 0.1  --  0.0    Recent Labs  Lab 12/13/20 2341 12/14/20 0705 12/15/20 0536 12/15/20 1213 12/16/20 0532  NA 131* 132* 134*  --  136  K 5.3* 5.3* 4.7  --  4.2  CL 95* 100 103  --  104  CO2 '28 25 26  '$ --  26  GLUCOSE 147* 107* 97  --  97  BUN 34* 30* 25*  --  19  CREATININE 1.61* 1.42* 1.43*  --  1.28*  CALCIUM 8.6* 7.8* 7.8*  --  7.9*  AST '24 17 15  '$ --  15  ALT '17 11 11  '$ --  10  ALKPHOS 100 67 71  --  69  BILITOT 0.9 0.9 1.0  --  0.7  ALBUMIN 3.6 2.4* 2.5*  --  2.4*  MG  --   --   --   --  2.5*  CRP  --   --   --  17.2*  --   PROCALCITON <0.10  --   --  0.71 0.49  LATICACIDVEN 1.5 1.0  --   --   --   INR 1.3* 1.2  --   --   --   BNP  --   --   --  512.0* 495.5*      RADIOLOGY STUDIES/RESULTS: No results found.   LOS: 2 days   Signature  Lala Lund M.D on 12/16/2020 at 10:23 AM   -  To page go to [email or web address]

## 2020-12-16 NOTE — Progress Notes (Signed)
Physical Therapy Treatment Patient Details Name: Jacob Moses MRN: [id number] DOB: 06-May-1927 Today's Date: 12/16/2020    History of Present Illness 87 y.o. male with history of HTN, hearing loss, vertigo-who was seen by his urologist a day prior to this hospitalization for Foley catheter removal-presented to the hospital with abdominal pain-he was found to have sepsis due to complicated UTI and acute urinary retention. He was also admitted to hospital end of july for R hip hemiarthroplasty.    PT Comments    Pt ready for session.  OOB with min a x 1.  He is able to walk 100 x 2 with RW and min guard.  To bathroom due to 2 small BM with gait on floor but no further BM on commode.  Initially RLE is IR but he is able to self correct with time and with straight gait in hallway without cues.  Fatigued with gait but overall does quite well today.   Follow Up Recommendations  SNF;Supervision/Assistance - 24 hour     Equipment Recommendations  Rolling walker with 5" wheels    Recommendations for Other Services       Precautions / Restrictions Precautions Precautions: Fall;Posterior Hip Precaution Booklet Issued: No Precaution Comments: Abduction pillow Restrictions Weight Bearing Restrictions: Yes RLE Weight Bearing: Weight bearing as tolerated Other Position/Activity Restrictions: Asked daughter to bring abduction pillow. OT made one to place today in ED to maintain precautions.    Mobility  Bed Mobility Overal bed mobility: Needs Assistance Bed Mobility: Supine to Sit     Supine to sit: Min assist          Transfers Overall transfer level: Needs assistance Equipment used: Rolling walker (2 wheeled) Transfers: Sit to/from Stand Sit to Stand: Min assist            Ambulation/Gait Ambulation/Gait assistance: Min guard;Min assist Gait Distance (Feet): 100 Feet Assistive device: Rolling walker (2 wheeled) Gait Pattern/deviations: Decreased step length -  right;Decreased step length - left;Decreased dorsiflexion - right;Decreased dorsiflexion - left;Decreased stride length;Decreased weight shift to right;Decreased weight shift to left;Trunk flexed Gait velocity: decreased   General Gait Details: 100' x 2.  IR initially of RLE but is able to correct with time.   Stairs             Wheelchair Mobility    Modified Rankin (Stroke Patients Only)       Balance Overall balance assessment: Needs assistance Sitting-balance support: Feet supported Sitting balance-Leahy Scale: Good     Standing balance support: Bilateral upper extremity supported;During functional activity Standing balance-Leahy Scale: Poor                              Cognition Arousal/Alertness: Awake/alert Behavior During Therapy: WFL for tasks assessed/performed Overall Cognitive Status: Within Functional Limits for tasks assessed                                 General Comments: Pt follows 1 step commands and is pleasant and cooperative. A & O x4.      Exercises Other Exercises Other Exercises: to bathroom.  inc 2 small stools on floor in room with gait.  no further BM on commode    General Comments        Pertinent Vitals/Pain Pain Assessment: Faces Faces Pain Scale: Hurts little more Pain Location: R LE with movement Pain Descriptors /  Indicators: Grimacing;Sore;Guarding Pain Intervention(s): Limited activity within patient's tolerance;Monitored during session;Repositioned    Home Living                      Prior Function            PT Goals (current goals can now be found in the care plan section) Progress towards PT goals: Progressing toward goals    Frequency    7X/week      PT Plan Current plan remains appropriate    Co-evaluation              AM-PAC PT "6 Clicks" Mobility   Outcome Measure  Help needed turning from your back to your side while in a flat bed without using  bedrails?: A Little Help needed moving from lying on your back to sitting on the side of a flat bed without using bedrails?: A Little Help needed moving to and from a bed to a chair (including a wheelchair)?: A Little Help needed standing up from a chair using your arms (e.g., wheelchair or bedside chair)?: A Little Help needed to walk in hospital room?: A Little Help needed climbing 3-5 steps with a railing? : A Lot 6 Click Score: 17    End of Session Equipment Utilized During Treatment: Gait belt Activity Tolerance: Patient tolerated treatment well Patient left: with call bell/phone within reach;with family/visitor present;in chair;with chair alarm set Nurse Communication: Mobility status PT Visit Diagnosis: Unsteadiness on feet (R26.81);History of falling (Z91.81);Muscle weakness (generalized) (M62.81);Other abnormalities of gait and mobility (R26.89);Difficulty in walking, not elsewhere classified (R26.2);Pain Pain - Right/Left: Right Pain - part of body: Leg;Hip     Time: [id number] PT Time Calculation (min) (ACUTE ONLY): 26 min  Charges:  $Gait Training: 23-37 mins                    Chesley Noon, PTA 12/16/20, 12:10 PM 12/16/2020, 12:09 PM

## 2020-12-17 MED ORDER — LACTATED RINGERS IV SOLN: INTRAVENOUS | Status: AC

## 2020-12-17 MED ORDER — ENSURE ENLIVE PO LIQD
237.0000 mL | Freq: Three times a day (TID) | ORAL | Status: DC
  Administered 2020-12-17: 237 mL via ORAL

## 2020-12-17 NOTE — Progress Notes (Signed)
PROGRESS NOTE        PATIENT DETAILS Name: Jacob Moses Age: 87 y.o. Sex: male Date of Birth: [birth year] Admit Date: 12/13/2020 Admitting Physician Evalee Mutton Kristeen Mans, MD [id number], Leona Carry, MD  Brief Narrative: Patient is a 87 y.o. male with history of HTN, hearing loss, vertigo-who was seen by his urologist a day prior to this hospitalization for Foley catheter removal-presented to the hospital with abdominal pain-he was found to have sepsis due to complicated UTI and acute urinary retention.  See below for further details.  Significant events: 7/28-8/1>> hospitalization for right hip fracture-discharged to SNF.  Foley catheter apparently inserted at SNF. 8/10>> Foley catheter removed by urology as outpatient 8/11>> to [facility] ED with fever/abdominal pain-found to have acute urinary retention-sepsis due to UTI  Significant studies: 8/11>> CXR: No pneumonia 8/12>> CT abdomen/pelvis: No SBO-mild stool burden.  Mild bilateral hydroureteronephrosis.  Antimicrobial therapy: Cefepime: 8/11>> Vancomycin: 8/11 x 1 Flagyl: 8/11 x 1  Microbiology data: 8/11>> urine culture: Pending 8/11>> blood culture: Negative so far  Procedures : 8/11>> Foley catheter reinserted in the emergency room.  Consults: None  DVT Prophylaxis : enoxaparin (LOVENOX) injection 40 mg Start: 12/17/20 0800   Subjective:  Patient in bed, appears comfortable, denies any headache, no fever, no chest pain or pressure, no shortness of breath , no abdominal pain. No new focal weakness, " feel down today"   Assessment/Plan:  Sepsis due to complicated UTI (related to recent Foley catheter insertion): Sepsis physiology improved on empiric antibiotics, culture sensitivity data noted, discussed with ID Dr. Juleen China over the phone on 12/16/2020 along with Dr. Jeffie Pollock urologist.  Plan is Bactrim for 5 to 7 days with close clinical monitoring.  Gentle IV fluids on 12/17/2020 for generalized  weakness.  Acute urinary retention: Probably has underlying BPH-voiding trial at urologist office on 8/10 was successful.  But eventually failed, Foley and Flomax continued, urology following.  Likely will be discharged with Foley catheter and Flomax.  Patient follow-up with Dr. Jeffie Pollock in 1 to 2 weeks.  Hematuria: Either from UTI or from BPH.  Resolved.  Urology following.  AKI on CKD stage IIIb Creatinine close to 1.5: AKI due to sepsis physiology and urinary retention.  Care resolved now close to baseline.  HTN: On Norvasc.  BPH: See above  Recent right hip fracture: Continue PT/OT-on prophylactic Lovenox.  Diet: Diet Order             Diet regular Room service appropriate? Yes with Assist; Fluid consistency: Thin  Diet effective now                    Code Status:  DNR-reconfirmed with daughter [name] on 8/12.   Family Communication: Spoke with daughter-Donna-[phone] over phone on 8/12, 8/15 2022  Daughter bedside on 12/15/2020, 12/16/2020  Disposition Plan: Status is: Inpatient  Remains inpatient appropriate because:Inpatient level of care appropriate due to severity of illness  Dispo: The patient is from: SNF              Anticipated d/c is to: SNF              Patient currently is not medically stable to d/c.   Difficult to place patient No   Barriers to Discharge: Resolving sepsis physiology-on IV antibiotics-not yet stable for discharge.  Antimicrobial agents: Anti-infectives (From admission, onward)  Start     Dose/Rate Route Frequency Ordered Stop   12/16/20 1130  sulfamethoxazole-trimethoprim (BACTRIM DS) 800-160 MG per tablet 1 tablet        1 tablet Oral Every 12 hours 12/16/20 1039     12/14/20 2200  ceFEPIme (MAXIPIME) 2 g in sodium chloride 0.9 % 100 mL IVPB  Status:  Discontinued        2 g 200 mL/hr over 30 Minutes Intravenous Every 24 hours 12/14/20 0229 12/16/20 1039   12/13/20 2330  ceFEPIme (MAXIPIME) 2 g in sodium chloride 0.9 % 100  mL IVPB        2 g 200 mL/hr over 30 Minutes Intravenous  Once 12/13/20 2319 12/14/20 0039   12/13/20 2330  metroNIDAZOLE (FLAGYL) IVPB 500 mg        500 mg 100 mL/hr over 60 Minutes Intravenous  Once 12/13/20 2319 12/14/20 0348   12/13/20 2330  vancomycin (VANCOCIN) IVPB 1000 mg/200 mL premix  Status:  Discontinued        1,000 mg 200 mL/hr over 60 Minutes Intravenous  Once 12/13/20 2319 12/13/20 2326   12/13/20 2330  vancomycin (VANCOREADY) IVPB 1250 mg/250 mL        1,250 mg 166.7 mL/hr over 90 Minutes Intravenous  Once 12/13/20 2326 12/14/20 0239        Time spent: 35 minutes-Greater than 50% of this time was spent in counseling, explanation of diagnosis, planning of further management, and coordination of care.  MEDICATIONS: Scheduled Meds:  ALPRAZolam  0.5 mg Oral Daily   amLODipine  5 mg Oral Daily   Chlorhexidine Gluconate Cloth  6 each Topical Daily   enoxaparin  40 mg Subcutaneous Q24H   finasteride  5 mg Oral Daily   multivitamin with minerals  1 tablet Oral Daily   polyethylene glycol  17 g Oral Daily   sulfamethoxazole-trimethoprim  1 tablet Oral Q12H   tamsulosin  0.4 mg Oral Daily   Continuous Infusions:  lactated ringers     PRN Meds:.acetaminophen, ALPRAZolam, bisacodyl, ondansetron (ZOFRAN) IV, oxyCODONE, traMADol, traZODone   PHYSICAL EXAM: Vital signs: Vitals:   12/16/20 1624 12/16/20 2016 12/17/20 0536 12/17/20 0751  BP: 114/86 117/67 (!) 113/54 123/71  Pulse: 77 72 62 72  Resp: '20 16 16 17  '$ Temp: 98 F (36.7 C) 98.7 F (37.1 C) 98.5 F (36.9 C) (!) 97.5 F (36.4 C)  TempSrc:      SpO2: 99% 99% 97% 96%  Weight:      Height:       Filed Weights   12/14/20 0141  Weight: 59 kg   Body mass index is 18.13 kg/m.   Exam:  Awake Alert, No new F.N deficits, Foley in place, R hip surgery postop site stable [place].AT,PERRAL Supple Neck,No JVD, No cervical lymphadenopathy appriciated.  Symmetrical Chest wall movement, Good air movement  bilaterally, CTAB RRR,No Gallops, Rubs or new Murmurs, No Parasternal Heave +ve B.Sounds, Abd Soft, No tenderness, No organomegaly appriciated, No rebound - guarding or rigidity. No Cyanosis, Clubbing or edema, No new Rash or bruise   I have personally reviewed following labs and imaging studies  LABORATORY DATA:  Recent Labs  Lab 12/13/20 2341 12/14/20 0705 12/15/20 0536 12/16/20 0532  WBC 27.9* 28.3* 19.2* 12.2*  HGB 11.4* 9.0* 9.0* 8.1*  HCT 33.9* 27.4* 27.7* 25.0*  PLT 747* 531* 536* 463*  MCV 97.4 98.6 97.5 99.6  MCH 32.8 32.4 31.7 32.3  MCHC 33.6 32.8 32.5 32.4  RDW 12.2 12.4 12.4  12.5  LYMPHSABS 0.7 1.4  --  1.1  MONOABS 1.6* 1.8*  --  0.9  EOSABS 0.1 0.0  --  0.5  BASOSABS 0.1 0.1  --  0.0    Recent Labs  Lab 12/13/20 2341 12/14/20 0705 12/15/20 0536 12/15/20 1213 12/16/20 0532  NA 131* 132* 134*  --  136  K 5.3* 5.3* 4.7  --  4.2  CL 95* 100 103  --  104  CO2 '28 25 26  '$ --  26  GLUCOSE 147* 107* 97  --  97  BUN 34* 30* 25*  --  19  CREATININE 1.61* 1.42* 1.43*  --  1.28*  CALCIUM 8.6* 7.8* 7.8*  --  7.9*  AST '24 17 15  '$ --  15  ALT '17 11 11  '$ --  10  ALKPHOS 100 67 71  --  69  BILITOT 0.9 0.9 1.0  --  0.7  ALBUMIN 3.6 2.4* 2.5*  --  2.4*  MG  --   --   --   --  2.5*  CRP  --   --   --  17.2*  --   PROCALCITON <0.10  --   --  0.71 0.49  LATICACIDVEN 1.5 1.0  --   --   --   INR 1.3* 1.2  --   --   --   BNP  --   --   --  512.0* 495.5*      RADIOLOGY STUDIES/RESULTS: No results found.   LOS: 3 days   Signature  Lala Lund M.D on 12/17/2020 at 9:47 AM   -  To page go to [email or web address]

## 2020-12-17 NOTE — TOC Progression Note (Addendum)
Transition of Care [facility]) - Progression Note    Patient Details  Name: Jacob Moses MRN: [id number] Date of Birth: Feb 24, 1928  Transition of Care [facility]) CM/SW Contact  Candie Chroman, LCSW Phone Number: 12/17/2020, 9:07 AM  Clinical Narrative:  No bed offers this morning other than [facility].   3:24 pm: This CSW working remote today. Tried calling patient in the room but no answer. Called daughter and provided bed offers. She is interested in [facility]. They are going to find out how much copay will be if he has to go over his 20 days.  Expected Discharge Plan: [place] Barriers to Discharge: Continued Medical Work up  Expected Discharge Plan and Services Expected Discharge Plan: [place]: [place] arrangements for the past 2 months: [place]                                       Social Determinants of Health (SDOH) Interventions    Readmission Risk Interventions No flowsheet data found.

## 2020-12-17 NOTE — Progress Notes (Signed)
Initial Nutrition Assessment  DOCUMENTATION CODES:  Severe malnutrition in context of social or environmental circumstances  INTERVENTION:  Liberalize diet to regular for advanced age and low BMI Ensure Enlive po TID, each supplement provides 350 kcal and 20 grams of protein  Magic cup TID with meals, each supplement provides 290 kcal and 9 grams of protein MVI with minerals daily  NUTRITION DIAGNOSIS:  Severe Malnutrition related to social / environmental circumstances as evidenced by severe muscle depletion, severe fat depletion.  GOAL:  Patient will meet greater than or equal to 90% of their needs  MONITOR:  PO intake, Supplement acceptance, Labs, Weight trends  REASON FOR ASSESSMENT:  Other (Comment) (low BMI)    ASSESSMENT:  87 y.o. male with history of dementia, HTN, GERD, and CKD3 presents to ED from his nursing facility with concerns for constipation.  Patient states his last bowel movement was approximately 10 days ago. Also had urinary retention earlier in the day at nursing facility  In ED, imaging showed mild stool burden, but abdomen significantly distended. Foley was replaced which resulted in 2L of blood tinged urine being remoed. Pt found to be septic related to UTI.  Pt resting in bedside chair at the time of assessment. Pt hard of hearing, but able to answer all nutrition related hx questions. Reports that he has eaten well today, had two bowls of soup and saved his dessert for later. States that at his facility, he goes to the dining room for meals and typically eats pretty good. Endorses drinking supplements at the facility, likes all flavors.   Pt very thin with significant muscle and fat deficits on exam.    Average Meal Intake: 8/11-8/14: 50% intake x 2 recorded meals (several meals documented as "bites)  Nutritionally Relevant Medications: Scheduled Meds:  multivitamin with minerals  1 tablet Oral Daily   polyethylene glycol  17 g Oral Daily    sulfamethoxazole-trimethoprim  1 tablet Oral Q12H   PRN Meds: bisacodyl, ondansetron  Labs Reviewed 8/14: Creatinine 1.28 Mg 2.5  NUTRITION - FOCUSED PHYSICAL EXAM: Flowsheet Row Most Recent Value  Orbital Region Severe depletion  Upper Arm Region Severe depletion  Thoracic and Lumbar Region Severe depletion  Buccal Region Severe depletion  Temple Region Severe depletion  Clavicle Bone Region Severe depletion  Clavicle and Acromion Bone Region Severe depletion  Scapular Bone Region Severe depletion  Dorsal Hand Severe depletion  Patellar Region Severe depletion  Anterior Thigh Region Severe depletion  Posterior Calf Region Severe depletion  Edema (RD Assessment) None  Hair Reviewed  Eyes Reviewed  Mouth Reviewed  Skin Reviewed  Nails Reviewed   Diet Order:   Diet Order             Diet regular Room service appropriate? Yes with Assist; Fluid consistency: Thin  Diet effective now                  EDUCATION NEEDS:  No education needs have been identified at this time  Skin:  Skin Assessment: Skin Integrity Issues: Skin Integrity Issues:: Stage II Stage II: buttocks/coccyx  Last BM:  8/14  Height:  Ht Readings from Last 1 Encounters:  12/14/20 '5\' 11"'$  (1.803 m)   Weight:  Wt Readings from Last 1 Encounters:  12/14/20 59 kg    Ideal Body Weight:  78.2 kg  BMI:  Body mass index is 18.13 kg/m.  Estimated Nutritional Needs:  Kcal:  1700-1900 kcal/d Protein:  85-100 g/d Fluid:  1.8-2L/d   Jacob Schneiders  Yong Moses, RD, LDN Clinical Dietitian Pager on [place]

## 2020-12-17 NOTE — Progress Notes (Signed)
Physical Therapy Treatment Patient Details Name: Jacob Moses MRN: [id number] DOB: 1927/06/22 Today's Date: 12/17/2020    History of Present Illness 87 y.o. male with history of HTN, hearing loss, vertigo-who was seen by his urologist a day prior to this hospitalization for Foley catheter removal-presented to the hospital with abdominal pain-he was found to have sepsis due to complicated UTI and acute urinary retention. He was also admitted to hospital end of july for R hip hemiarthroplasty.    PT Comments    Pt in recliner.  He is able to stand with min a x 1.  Dons brief due to inc BM in prior sessions.  Walk 100' x 2 with RW and min assist with daughter providing recliner follow.  Gait remains limited by overall weakness but he continues to work hard in each session and gait is improving.   Follow Up Recommendations  SNF;Supervision/Assistance - 24 hour     Equipment Recommendations  Rolling walker with 5" wheels    Recommendations for Other Services       Precautions / Restrictions Precautions Precautions: Fall;Posterior Hip Precaution Booklet Issued: No Precaution Comments: Abduction pillow    Mobility  Bed Mobility               General bed mobility comments: pt upright in recliner upon arrival.    Transfers Overall transfer level: Needs assistance Equipment used: Rolling walker (2 wheeled) Transfers: Sit to/from Stand Sit to Stand: Min assist            Ambulation/Gait Ambulation/Gait assistance: Min guard;Min assist Gait Distance (Feet): 100 Feet Assistive device: Standard walker Gait Pattern/deviations: Decreased step length - right;Decreased step length - left;Decreased dorsiflexion - right;Decreased dorsiflexion - left;Decreased stride length;Decreased weight shift to right;Decreased weight shift to left;Trunk flexed Gait velocity: decreased   General Gait Details: 100' x 2.  IR initially of RLE but is able to correct with time.   Stairs              Wheelchair Mobility    Modified Rankin (Stroke Patients Only)       Balance Overall balance assessment: Needs assistance Sitting-balance support: Feet supported Sitting balance-Leahy Scale: Good     Standing balance support: Bilateral upper extremity supported;During functional activity Standing balance-Leahy Scale: Poor                              Cognition Arousal/Alertness: Awake/alert Behavior During Therapy: WFL for tasks assessed/performed Overall Cognitive Status: Within Functional Limits for tasks assessed                                        Exercises      General Comments        Pertinent Vitals/Pain Pain Assessment: Faces Faces Pain Scale: Hurts a little bit Pain Location: R LE with movement Pain Descriptors / Indicators: Grimacing;Sore;Guarding Pain Intervention(s): Limited activity within patient's tolerance;Monitored during session;Repositioned    Home Living                      Prior Function            PT Goals (current goals can now be found in the care plan section) Progress towards PT goals: Progressing toward goals    Frequency    7X/week      PT Plan  Current plan remains appropriate    Co-evaluation              AM-PAC PT "6 Clicks" Mobility   Outcome Measure  Help needed turning from your back to your side while in a flat bed without using bedrails?: A Little Help needed moving from lying on your back to sitting on the side of a flat bed without using bedrails?: A Little Help needed moving to and from a bed to a chair (including a wheelchair)?: A Little Help needed standing up from a chair using your arms (e.g., wheelchair or bedside chair)?: A Little Help needed to walk in hospital room?: A Little Help needed climbing 3-5 steps with a railing? : A Lot 6 Click Score: 17    End of Session Equipment Utilized During Treatment: Gait belt Activity Tolerance:  Patient tolerated treatment well Patient left: with call bell/phone within reach;with family/visitor present;in chair;with chair alarm set Nurse Communication: Mobility status PT Visit Diagnosis: Unsteadiness on feet (R26.81);History of falling (Z91.81);Muscle weakness (generalized) (M62.81);Other abnormalities of gait and mobility (R26.89);Difficulty in walking, not elsewhere classified (R26.2);Pain Pain - Right/Left: Right Pain - part of body: Leg;Hip     Time: 0911-0927 PT Time Calculation (min) (ACUTE ONLY): 16 min  Charges:  $Gait Training: 8-22 mins                    Chesley Noon, PTA 12/17/20, 11:22 AM 12/17/2020, 11:21 AM

## 2020-12-17 NOTE — Progress Notes (Signed)
Occupational Therapy Treatment Patient Details Name: Jacob Moses MRN: [id number] DOB: 01/18/1928 Today's Date: 12/17/2020    History of present illness 87 y.o. male with history of HTN, hearing loss, vertigo-who was seen by his urologist a day prior to this hospitalization for Foley catheter removal-presented to the hospital with abdominal pain-he was found to have sepsis due to complicated UTI and acute urinary retention. He was also admitted to hospital end of july for R hip hemiarthroplasty.   OT comments  Upon entering the room, pt supine in bed and resting comfortably with daughter present in room. Multiple attempts made to see pt today but has had other staff present in room. Pt is agreeable to OT intervention with focus on B UE HEP. OT educates and demonstrates use of yellow resistive theraband for B UE strengthening exercises. Pt required min cuing for technique but performs 10 reps of bicep curls, shoulder diagonals, shoulder elevation, alternating punches, and chest pulls. Pt needing rest breaks secondary to fatigue with therapeutic exercise. Theraband left with pt to work on in his spare time.   Follow Up Recommendations  SNF    Equipment Recommendations  Other (comment) (defer to next venue of care)          Precautions / Restrictions Precautions Precautions: Fall;Posterior Hip Precaution Booklet Issued: No Precaution Comments: Abduction pillow Restrictions Other Position/Activity Restrictions: Asked daughter to bring abduction pillow.              ADL either performed or assessed with clinical judgement     Vision Baseline Vision/History: Wears glasses Wears Glasses: Reading only Patient Visual Report: No change from baseline            Cognition Arousal/Alertness: Awake/alert Behavior During Therapy: WFL for tasks assessed/performed Overall Cognitive Status: Within Functional Limits for tasks assessed                                  General Comments: Pt follows 1 step commands and is pleasant and cooperative. A & O x4.                   Pertinent Vitals/ Pain       Pain Assessment: No/denies pain         Frequency  Min 2X/week        Progress Toward Goals  OT Goals(current goals can now be found in the care plan section)  Progress towards OT goals: Progressing toward goals  Acute Rehab OT Goals Patient Stated Goal: return to rehab OT Goal Formulation: With patient/family Time For Goal Achievement: 12/28/20 Potential to Achieve Goals: Good  Plan Discharge plan remains appropriate;Frequency remains appropriate       AM-PAC OT "6 Clicks" Daily Activity     Outcome Measure   Help from another person eating meals?: None Help from another person taking care of personal grooming?: A Little Help from another person toileting, which includes using toliet, bedpan, or urinal?: A Little Help from another person bathing (including washing, rinsing, drying)?: A Little Help from another person to put on and taking off regular upper body clothing?: A Little Help from another person to put on and taking off regular lower body clothing?: A Little 6 Click Score: 19    End of Session    OT Visit Diagnosis: Unsteadiness on feet (R26.81);Muscle weakness (generalized) (M62.81)   Activity Tolerance Patient tolerated treatment well   Patient Left in bed;with  call bell/phone within reach;with family/visitor present   Nurse Communication Mobility status        Time: [id number] OT Time Calculation (min): 23 min  Charges: OT General Charges $OT Visit: 1 Visit OT Treatments $Therapeutic Exercise: 23-37 mins  Darleen Crocker, MS, OTR/L , CBIS ascom [phone]  12/17/20, 4:29 PM

## 2020-12-18 LAB — RESP PANEL BY RT-PCR (FLU A&B, COVID) ARPGX2
Influenza A by PCR: NEGATIVE
Influenza B by PCR: NEGATIVE
SARS Coronavirus 2 by RT PCR: NEGATIVE

## 2020-12-18 MED ORDER — FINASTERIDE 5 MG PO TABS: 5.0000 mg | ORAL_TABLET | Freq: Every day | ORAL | Status: DC

## 2020-12-18 MED ORDER — TRAMADOL HCL 50 MG PO TABS: 50.0000 mg | ORAL_TABLET | Freq: Two times a day (BID) | ORAL | 0 refills | Status: DC | PRN

## 2020-12-18 MED ORDER — OXYCODONE HCL 5 MG PO TABS: 2.5000 mg | ORAL_TABLET | Freq: Four times a day (QID) | ORAL | 0 refills | Status: DC | PRN

## 2020-12-18 MED ORDER — ALPRAZOLAM ER 0.5 MG PO TB24: 0.5000 mg | ORAL_TABLET | Freq: Every day | ORAL | 0 refills | Status: DC

## 2020-12-18 MED ORDER — SULFAMETHOXAZOLE-TRIMETHOPRIM 800-160 MG PO TABS: 1.0000 | ORAL_TABLET | Freq: Two times a day (BID) | ORAL | 0 refills | Status: AC

## 2020-12-18 NOTE — TOC Progression Note (Signed)
Transition of Care [facility]) - Progression Note    Patient Details  Name: Jacob Moses MRN: [id number] Date of Birth: 1927-12-20  Transition of [facility]) CM/SW [place], LCSW Phone Number: 12/18/2020, 8:46 AM  Clinical Narrative:  Notified daughter that starting on day 21, patient will have to start paying $1,361.50 per week to cover copays if going to [facility]. She is agreeable. She believes he will be discharging today so sent secure chat to MD to confirm. Left message for SNF admissions coordinator to update her and see if daughter can go ahead and complete paperwork since she'll be teaching a bible study tonight.   Expected Discharge Plan: [place] Barriers to Discharge: Continued Medical Work up  Expected Discharge Plan and Services Expected Discharge Plan: [place] Choice: [facility] arrangements for the past 2 months: [place] Expected Discharge Date: 12/18/20                                     Social Determinants of Health (SDOH) Interventions    Readmission Risk Interventions No flowsheet data found.

## 2020-12-18 NOTE — Plan of Care (Signed)
DISCHARGE NOTE SNF [name] to be discharged [facility] per MD order. Patient verbalized understanding.  Skin clean, dry and intact without evidence of skin break down, no evidence of skin tears noted. IV catheter discontinued intact. Site without signs and symptoms of complications. Dressing and pressure applied. Pt denies pain at the site currently. No complaints noted.  Patient free of lines, drains, and wounds.   Discharge packet assembled. An After Visit Summary (AVS) was printed and given to the EMS personnel. Patient escorted via stretcher and discharged to [facility] via ambulance. Report called to Kim at [employer]; all questions and concerns addressed.   Stephan Minister, RN

## 2020-12-18 NOTE — Discharge Instructions (Addendum)
Buttocks Right Stage ll open area noted with out any slough . wound bed moist and pink covered with coam dressing.  Follow with Primary MD Albina Billet, MD in 7 days   Get CBC, CMP, 2 view Chest X ray -  checked next visit within 1 week by Primary MD or SNF MD    Activity: As tolerated with Full fall precautions use walker/cane & assistance as needed  Disposition SNF  Diet: Heart Healthy    Special Instructions: If you have smoked or chewed Tobacco  in the last 2 yrs please stop smoking, stop any regular Alcohol  and or any Recreational drug use.  On your next visit with your primary care physician please Get Medicines reviewed and adjusted.  Please request your Prim.MD to go over all Hospital Tests and Procedure/Radiological results at the follow up, please get all Hospital records sent to your Prim MD by signing hospital release before you go home.  If you experience worsening of your admission symptoms, develop shortness of breath, life threatening emergency, suicidal or homicidal thoughts you must seek medical attention immediately by calling 911 or calling your MD immediately  if symptoms less severe.  You Must read complete instructions/literature along with all the possible adverse reactions/side effects for all the Medicines you take and that have been prescribed to you. Take any new Medicines after you have completely understood and accpet all the possible adverse reactions/side effects.

## 2020-12-18 NOTE — Progress Notes (Signed)
Physical Therapy Treatment Patient Details Name: Jacob Moses MRN: [id number] DOB: 1927-11-26 Today's Date: 12/18/2020    History of Present Illness 87 y.o. male with history of HTN, hearing loss, vertigo-who was seen by his urologist a day prior to this hospitalization for Foley catheter removal-presented to the hospital with abdominal pain-he was found to have sepsis due to complicated UTI and acute urinary retention. He was also admitted to hospital end of july for R hip hemiarthroplasty.    PT Comments    Pt ready for session.  Sitting EOB finished breakfast.  Stood with min a x 1 and cues for hand placements.  He is able to walk 120' then 100' with RW and min guard/assist.  Cues for walker and hand placements and to increase BOS with gait which he does with cues/reminders.  Remained in recliner after session with daughter in room.     Follow Up Recommendations  SNF;Supervision/Assistance - 24 hour     Equipment Recommendations  Rolling walker with 5" wheels    Recommendations for Other Services       Precautions / Restrictions Precautions Precautions: Fall;Posterior Hip Precaution Booklet Issued: No Precaution Comments: Abduction pillow Restrictions Weight Bearing Restrictions: Yes RLE Weight Bearing: Weight bearing as tolerated    Mobility  Bed Mobility               General bed mobility comments: sitting EOB eating breakfast upon arrival without difficulty    Transfers Overall transfer level: Needs assistance Equipment used: Rolling walker (2 wheeled) Transfers: Sit to/from Stand Sit to Stand: Min assist         General transfer comment: Pt requires minA from therapist to come upright.  Ambulation/Gait Ambulation/Gait assistance: Min guard;Min assist Gait Distance (Feet): 120 Feet Assistive device: Rolling walker (2 wheeled) Gait Pattern/deviations: Decreased step length - right;Decreased step length - left;Decreased dorsiflexion - right;Decreased  dorsiflexion - left;Decreased stride length;Decreased weight shift to right;Decreased weight shift to left;Trunk flexed Gait velocity: decreased   General Gait Details: 120' then 100' after seated rest   Stairs             Wheelchair Mobility    Modified Rankin (Stroke Patients Only)       Balance Overall balance assessment: Needs assistance Sitting-balance support: Feet supported Sitting balance-Leahy Scale: Good     Standing balance support: Bilateral upper extremity supported;During functional activity Standing balance-Leahy Scale: Poor                              Cognition Arousal/Alertness: Awake/alert Behavior During Therapy: WFL for tasks assessed/performed Overall Cognitive Status: Within Functional Limits for tasks assessed                                        Exercises      General Comments        Pertinent Vitals/Pain Pain Assessment: Faces Faces Pain Scale: Hurts a little bit Pain Location: R LE with movement Pain Descriptors / Indicators: Grimacing;Sore;Guarding Pain Intervention(s): Limited activity within patient's tolerance;Monitored during session;Repositioned    Home Living                      Prior Function            PT Goals (current goals can now be found in the care plan section)  Progress towards PT goals: Progressing toward goals    Frequency    7X/week      PT Plan Current plan remains appropriate    Co-evaluation              AM-PAC PT "6 Clicks" Mobility   Outcome Measure  Help needed turning from your back to your side while in a flat bed without using bedrails?: A Little Help needed moving from lying on your back to sitting on the side of a flat bed without using bedrails?: A Little Help needed moving to and from a bed to a chair (including a wheelchair)?: A Little Help needed standing up from a chair using your arms (e.g., wheelchair or bedside chair)?: A  Little Help needed to walk in hospital room?: A Little Help needed climbing 3-5 steps with a railing? : A Lot 6 Click Score: 17    End of Session Equipment Utilized During Treatment: Gait belt Activity Tolerance: Patient tolerated treatment well Patient left: with call bell/phone within reach;with family/visitor present;in chair;with chair alarm set Nurse Communication: Mobility status PT Visit Diagnosis: Unsteadiness on feet (R26.81);History of falling (Z91.81);Muscle weakness (generalized) (M62.81);Other abnormalities of gait and mobility (R26.89);Difficulty in walking, not elsewhere classified (R26.2);Pain Pain - Right/Left: Right Pain - part of body: Leg;Hip     Time: [id number] PT Time Calculation (min) (ACUTE ONLY): 18 min  Charges:  $Gait Training: 8-22 mins                    Chesley Noon, PTA 12/18/20, 9:16 AM  12/18/2020, 9:15 AM

## 2020-12-18 NOTE — Discharge Summary (Signed)
Jacob Moses [id number] DOB: 1928-02-22 DOA: 12/13/2020  PCP: Albina Billet, MD  Admit date: 12/13/2020  Discharge date: 12/18/2020  Admitted From: SNF  Disposition:  SNF   Recommendations for Outpatient Follow-up:   Follow up with PCP in 1-2 weeks  PCP Please obtain BMP/CBC, 2 view CXR in 1week,  (see Discharge instructions)   PCP Please follow up on the following pending results:    Home Health: None Equipment/Devices: None  Consultations: Urology Discharge Condition: Stable    CODE STATUS: Full    Diet Recommendation: Heart Healthy     Chief Complaint  Patient presents with   Abdominal Pain     Brief history of present illness from the day of admission and additional interim summary    Patient is a 87 y.o. male with history of HTN, hearing loss, vertigo-who was seen by his urologist a day prior to this hospitalization for Foley catheter removal-presented to the hospital with abdominal pain-he was found to have sepsis due to complicated UTI and acute urinary retention.  See below for further details.   Significant events: 7/28-8/1>> hospitalization for right hip fracture-discharged to SNF.  Foley catheter apparently inserted at SNF. 8/10>> Foley catheter removed by urology as outpatient 8/11>> to [facility] ED with fever/abdominal pain-found to have acute urinary retention-sepsis due to UTI   Significant studies: 8/11>> CXR: No pneumonia 8/12>> CT abdomen/pelvis: No SBO-mild stool burden.  Mild bilateral hydroureteronephrosis.                                                                 Hospital Course    Sepsis due to complicated UTI (related to recent Foley catheter insertion): Sepsis physiology improved on empiric antibiotics, culture sensitivity data noted, discussed with ID Dr. Juleen China over  the phone on 12/16/2020 along with Dr. Jeffie Pollock urologist.  Placed on Bactrim stop date 5 more days from today, will be discharged with Foley catheter will follow with urology within a week, Foley catheter will be replaced or removed by urology team as needed.   Acute urinary retention: Probably has underlying BPH-voiding trial at urologist office on 8/10 was successful.  But eventually failed, Foley and Flomax continued and Proscar added, seen by urology here and should follow with them within a week of discharge.  Will be discharged with Foley catheter.   Hematuria: Due to Foley catheter and UTI, now resolved.  Urology f to follow.   AKI on CKD stage IIIb Creatinine close to 1.5: AKI due to sepsis physiology and urinary retention.  He has resolved now creatinine close to baseline.   HTN: Was on Norvasc currently not needing.  Continue to monitor at SNF.   BPH: See above   Recent right hip fracture: Continue PT/OT-on prophylactic Lovenox.  He must follow with his orthopedic surgeon  within a week of discharge.  Weightbearing as tolerated.     Discharge diagnosis     Active Problems:   Sepsis due to gram-negative UTI ([place])   Sepsis [facility])    Discharge instructions    Discharge Instructions     Diet - low sodium heart healthy   Complete by: As directed    Discharge instructions   Complete by: As directed    Buttocks Right Stage ll open area noted with out any slough . wound bed moist and pink covered with coam dressing.  Follow with Primary MD Albina Billet, MD in 7 days   Get CBC, CMP, 2 view Chest X ray -  checked next visit within 1 week by Primary MD or SNF MD    Activity: As tolerated with Full fall precautions use walker/cane & assistance as needed  Disposition SNF  Diet: Heart Healthy    Special Instructions: If you have smoked or chewed Tobacco  in the last 2 yrs please stop smoking, stop any regular Alcohol  and or any Recreational drug use.  On your next visit with  your primary care physician please Get Medicines reviewed and adjusted.  Please request your Prim.MD to go over all Hospital Tests and Procedure/Radiological results at the follow up, please get all Hospital records sent to your Prim MD by signing hospital release before you go home.  If you experience worsening of your admission symptoms, develop shortness of breath, life threatening emergency, suicidal or homicidal thoughts you must seek medical attention immediately by calling 911 or calling your MD immediately  if symptoms less severe.  You Must read complete instructions/literature along with all the possible adverse reactions/side effects for all the Medicines you take and that have been prescribed to you. Take any new Medicines after you have completely understood and accpet all the possible adverse reactions/side effects.   Increase activity slowly   Complete by: As directed    No wound care   Complete by: As directed    Buttocks Right Stage ll open area noted with out any slough . wound bed moist and pink covered with coam dressing.       Discharge Medications   Allergies as of 12/18/2020   No Known Allergies      Medication List     STOP taking these medications    amLODipine 5 MG tablet Commonly known as: NORVASC       TAKE these medications    ALPRAZolam 0.5 MG 24 hr tablet Commonly known as: XANAX XR Take 1 tablet (0.5 mg total) by mouth daily.   cholecalciferol 25 MCG (1000 UNIT) tablet Commonly known as: VITAMIN D3 Take 2,000 Units by mouth daily.   docusate sodium 100 MG capsule Commonly known as: COLACE Take 100 mg by mouth 2 (two) times daily.   enoxaparin 40 MG/0.4ML injection Commonly known as: LOVENOX Inject 0.3 mLs (30 mg total) into the skin daily for 14 days.   finasteride 5 MG tablet Commonly known as: PROSCAR Take 1 tablet (5 mg total) by mouth daily.   lactulose 10 GM/15ML solution Commonly known as: CHRONULAC Take 20 g by mouth  daily as needed for mild constipation.   multivitamin with minerals tablet Take 1 tablet by mouth daily.   ondansetron 4 MG tablet Commonly known as: ZOFRAN Take 1 tablet (4 mg total) by mouth every 6 (six) hours as needed for nausea.   oxyCODONE 5 MG immediate release tablet Commonly known as: Oxy IR/ROXICODONE Take  0.5 tablets (2.5 mg total) by mouth every 6 (six) hours as needed for breakthrough pain or severe pain (pain score 4-6). What changed:  how much to take when to take this reasons to take this   polyethylene glycol 17 g packet Commonly known as: MIRALAX / GLYCOLAX Take 17 g by mouth daily as needed.   sulfamethoxazole-trimethoprim 800-160 MG tablet Commonly known as: BACTRIM DS Take 1 tablet by mouth every 12 (twelve) hours for 3 days.   tamsulosin 0.4 MG Caps capsule Commonly known as: FLOMAX Take 0.4 mg by mouth daily.   traMADol 50 MG tablet Commonly known as: ULTRAM Take 1 tablet (50 mg total) by mouth every 12 (twelve) hours as needed for moderate pain. What changed: when to take this   traZODone 50 MG tablet Commonly known as: DESYREL Take 0.5 tablets (25 mg total) by mouth at bedtime as needed for sleep.   zinc oxide 20 % ointment Apply 1 application topically 2 (two) times daily as needed for irritation.         Contact information for follow-up providers     Albina Billet, MD. Schedule an appointment as soon as possible for a visit in 1 week(s).   Specialty: Internal Medicine Contact information: [address]   [place] [address] [phone]         Irine Seal, MD. Schedule an appointment as soon as possible for a visit in 1 week(s).   Specialty: Urology Contact information: [place] [address] [phone]              Contact information for after-discharge care     Destination     [facility] Preferred SNF .   Service: Skilled Nursing Contact  information: [address] [place] [phone]                     Major procedures and Radiology Reports - PLEASE review detailed and final reports thoroughly  -       CT Head Wo Contrast  Result Date: 11/29/2020 CLINICAL DATA:  Neck trauma (Age >= 65y); Head trauma, minor (Age >= 65y) EXAM: CT HEAD WITHOUT CONTRAST CT CERVICAL SPINE WITHOUT CONTRAST TECHNIQUE: Multidetector CT imaging of the head and cervical spine was performed following the standard protocol without intravenous contrast. Multiplanar CT image reconstructions of the cervical spine were also generated. COMPARISON:  12/30/2019. FINDINGS: CT HEAD FINDINGS Brain: In comparison to 12/30/2019, no substantial change in the size of a 12 mm mixed density left subdural collection. There is a small amount of hyperdensity within the collection which could represent recent hemorrhage. No significant mass effect or midline shift. No clearly new hemorrhage. No evidence of acute large vascular territory infarct. Similar patchy white matter hypoattenuation, most likely related to chronic microvascular ischemic disease. No hydrocephalus. No mass lesion. Vascular: No hyperdense vessel identified. Calcific intracranial atherosclerosis. Skull: No acute fracture. Sinuses/Orbits: Polyp or retention cyst in the inferior left maxillary sinus. Mild ethmoid air cell mucosal thickening. Otherwise, sinuses are clear. Unremarkable orbits. Other: No mastoid effusions. CT CERVICAL SPINE FINDINGS Alignment: Similar alignment. Similar mild reversal the cervical lordosis. Similar slight widening of the C2-C3 disc space anteriorly. No substantial sagittal subluxation. Mild broad dextrocurvature. Skull base and vertebrae: Similar appearance of extensive ankylosis throughout the cervical spine from C2 inferiorly. No evidence of acute fracture. Vertebral body heights are similar to prior. Diffuse osteopenia. Soft tissues and spinal  canal:  No prevertebral fluid or swelling. No visible canal hematoma. Disc levels: Similar partially calcified central disc protrusion at C2-C3. Multilevel facet and uncovertebral hypertrophy. Multi level posterior osteophytic spurring which narrows the canal without evidence of high-grade bony canal stenosis. Upper chest: Biapical pleuroparenchymal scarring. Otherwise, visualized lung apices are clear. IMPRESSION: CT Head: 1. In comparison to 12/30/2019, no substantial change in size of a 12 mm mixed density left subdural collection. There is a small amount of hyperdensity within the collection, which may represent recent hemorrhage. No significant mass effect or midline shift. 2. Otherwise, no new acute abnormality. CT Cervical Spine: 1. No evidence of acute fracture or traumatic malalignment. 2. Similar appearance of extensive ankylosis throughout the cervical spine from C2 inferiorly. 3. Diffuse osteopenia. Electronically Signed   By: Margaretha Sheffield MD   On: 11/29/2020 11:40   CT Cervical Spine Wo Contrast  Result Date: 11/29/2020 CLINICAL DATA:  Neck trauma (Age >= 65y); Head trauma, minor (Age >= 65y) EXAM: CT HEAD WITHOUT CONTRAST CT CERVICAL SPINE WITHOUT CONTRAST TECHNIQUE: Multidetector CT imaging of the head and cervical spine was performed following the standard protocol without intravenous contrast. Multiplanar CT image reconstructions of the cervical spine were also generated. COMPARISON:  12/30/2019. FINDINGS: CT HEAD FINDINGS Brain: In comparison to 12/30/2019, no substantial change in the size of a 12 mm mixed density left subdural collection. There is a small amount of hyperdensity within the collection which could represent recent hemorrhage. No significant mass effect or midline shift. No clearly new hemorrhage. No evidence of acute large vascular territory infarct. Similar patchy white matter hypoattenuation, most likely related to chronic microvascular ischemic disease. No hydrocephalus. No mass  lesion. Vascular: No hyperdense vessel identified. Calcific intracranial atherosclerosis. Skull: No acute fracture. Sinuses/Orbits: Polyp or retention cyst in the inferior left maxillary sinus. Mild ethmoid air cell mucosal thickening. Otherwise, sinuses are clear. Unremarkable orbits. Other: No mastoid effusions. CT CERVICAL SPINE FINDINGS Alignment: Similar alignment. Similar mild reversal the cervical lordosis. Similar slight widening of the C2-C3 disc space anteriorly. No substantial sagittal subluxation. Mild broad dextrocurvature. Skull base and vertebrae: Similar appearance of extensive ankylosis throughout the cervical spine from C2 inferiorly. No evidence of acute fracture. Vertebral body heights are similar to prior. Diffuse osteopenia. Soft tissues and spinal canal: No prevertebral fluid or swelling. No visible canal hematoma. Disc levels: Similar partially calcified central disc protrusion at C2-C3. Multilevel facet and uncovertebral hypertrophy. Multi level posterior osteophytic spurring which narrows the canal without evidence of high-grade bony canal stenosis. Upper chest: Biapical pleuroparenchymal scarring. Otherwise, visualized lung apices are clear. IMPRESSION: CT Head: 1. In comparison to 12/30/2019, no substantial change in size of a 12 mm mixed density left subdural collection. There is a small amount of hyperdensity within the collection, which may represent recent hemorrhage. No significant mass effect or midline shift. 2. Otherwise, no new acute abnormality. CT Cervical Spine: 1. No evidence of acute fracture or traumatic malalignment. 2. Similar appearance of extensive ankylosis throughout the cervical spine from C2 inferiorly. 3. Diffuse osteopenia. Electronically Signed   By: Margaretha Sheffield MD   On: 11/29/2020 11:40   CT ABDOMEN PELVIS W CONTRAST  Result Date: 12/14/2020 CLINICAL DATA:  Abdominal pain, bloating EXAM: CT ABDOMEN AND PELVIS WITH CONTRAST TECHNIQUE: Multidetector CT  imaging of the abdomen and pelvis was performed using the standard protocol following bolus administration of intravenous contrast. CONTRAST:  33m OMNIPAQUE IOHEXOL 350 MG/ML SOLN COMPARISON:  None. FINDINGS: Lower chest: Mild patchy bibasilar opacities,  likely atelectasis. Hepatobiliary: Liver is within normal limits. Gallbladder is unremarkable. No intrahepatic or extrahepatic ductal dilatation. Pancreas: Within normal limits. Spleen: Within normal limits. Adrenals/Urinary Tract: Adrenal glands are within normal limits. 5.0 cm right lower pole renal cyst (series 2/image 35). Additional 2.0 cm right upper pole renal cyst (series 2/image 19). Left kidney is within normal limits. Mild bilateral hydroureteronephrosis. Bladder decompressed by an indwelling Foley catheter, poorly visualized due to streak artifact. Stomach/Bowel: Surgical clips at the GE junction. No evidence of bowel obstruction. Normal appendix (series 2/image 63). Mild rectal stool burden. Vascular/Lymphatic: No evidence of abdominal aortic aneurysm. Atherosclerotic calcifications of the abdominal aorta and branch vessels. No suspicious abdominopelvic lymphadenopathy. Reproductive: Prostate is unremarkable. Other: No abdominopelvic ascites. Musculoskeletal: Degenerative changes of the visualized thoracolumbar spine. Right hip hemiarthroplasty. IMPRESSION: No evidence of bowel obstruction. Normal appendix. Mild rectal stool burden. Mild bilateral hydroureteronephrosis. This appearance is nonspecific but may be related to chronic bladder outlet obstruction. Bladder decompressed by an indwelling Foley catheter. Additional ancillary findings as above. Electronically Signed   By: Julian Hy M.D.   On: 12/14/2020 01:30   DG Pelvis Portable  Result Date: 11/29/2020 CLINICAL DATA:  Postop right hip hemiarthroplasty. EXAM: PORTABLE PELVIS 1-2 VIEWS COMPARISON:  Preoperative radiograph earlier today. FINDINGS: Right hip hemiarthroplasty in expected  alignment. No periprosthetic lucency. Recent postsurgical change includes air and edema in the soft tissues. Lateral skin staples in place. IMPRESSION: Right hip hemiarthroplasty without immediate postoperative complication. Electronically Signed   By: Keith Rake M.D.   On: 11/29/2020 17:09   DG Pelvis Portable  Result Date: 11/29/2020 CLINICAL DATA:  Right hip replacement. EXAM: PORTABLE PELVIS 1-2 VIEWS COMPARISON:  Same day. FINDINGS: Two intraoperative images were obtained of the right hip. The femoral prosthesis is noted. Expected postoperative changes are seen in the surrounding soft tissues. IMPRESSION: Femoral prosthesis is noted. Electronically Signed   By: Marijo Conception M.D.   On: 11/29/2020 15:47   DG Pelvis Portable  Result Date: 11/29/2020 CLINICAL DATA:  Closed right hip fracture EXAM: PORTABLE PELVIS 1-2 VIEWS COMPARISON:  None. FINDINGS: There is a right femoral neck fracture with varus angulation. No subluxation or dislocation. Hip joints are symmetric. IMPRESSION: Right femoral neck fracture with varus angulation. Electronically Signed   By: Rolm Baptise M.D.   On: 11/29/2020 10:17   DG Chest Port 1 View  Result Date: 12/13/2020 CLINICAL DATA:  Bloating no bowel movement EXAM: PORTABLE CHEST 1 VIEW COMPARISON:  11/29/2020 FINDINGS: The heart size and mediastinal contours are within normal limits. Both lungs are clear. The visualized skeletal structures are unremarkable. IMPRESSION: No active disease. Electronically Signed   By: Donavan Foil M.D.   On: 12/13/2020 23:35   DG Chest Port 1 View  Result Date: 11/29/2020 CLINICAL DATA:  87 year old male status post fall at home. Right femoral neck fracture. EXAM: PORTABLE CHEST 1 VIEW COMPARISON:  None. FINDINGS: Portable AP view at 0845 hours. Low lung volumes. Mild cardiomegaly. Other mediastinal contours are within normal limits. Visualized tracheal air column is within normal limits. Allowing for portable technique the lungs  are clear. No pneumothorax or pleural effusion is evident. No acute osseous abnormality identified. IMPRESSION: Low lung volumes.  No acute cardiopulmonary abnormality. Electronically Signed   By: Genevie Ann M.D.   On: 11/29/2020 09:15   DG Hip Unilat  With Pelvis 2-3 Views Right  Result Date: 11/29/2020 CLINICAL DATA:  Fall. EXAM: DG HIP (WITH OR WITHOUT PELVIS) 2-3V RIGHT COMPARISON:  No  prior. FINDINGS: Diffuse severe osteopenia and degenerative change. Comminuted right femoral neck fracture with angulation deformity noted. No evidence of dislocation. Pelvic calcifications consistent phleboliths. Dystrophic calcifications noted over the pelvis. IMPRESSION: 1. Comminuted right femoral neck fracture with angulation deformity. 2.  Severe diffuse osteopenia.  Diffuse degenerative change. Electronically Signed   By: Marcello Moores  Register   On: 11/29/2020 09:02      Today   Subjective    Jacob Moses today has no headache,no chest abdominal pain,no new weakness tingling or numbness, feels much better    Objective   Blood pressure (!) 105/58, pulse 60, temperature 97.8 F (36.6 C), resp. rate 17, height '5\' 11"'$  (1.803 m), weight 59 kg, SpO2 99 %.   Intake/Output Summary (Last 24 hours) at 12/18/2020 0851 Last data filed at 12/18/2020 0553 Gross per 24 hour  Intake 353.65 ml  Output 2825 ml  Net -2471.35 ml    Exam  Awake Alert, No new F.N deficits,   [place].AT,PERRAL Supple Neck,No JVD, No cervical lymphadenopathy appriciated.  Symmetrical Chest wall movement, Good air movement bilaterally, CTAB RRR,No Gallops,Rubs or new Murmurs, No Parasternal Heave +ve B.Sounds, Abd Soft, Non tender, No organomegaly appriciated, No rebound -guarding or rigidity. No Cyanosis, Clubbing or edema, Foley in place, right hip postop site appears stable.   Data Review   CBC w Diff:  Lab Results  Component Value Date   WBC 12.2 (H) 12/16/2020   HGB 8.1 (L) 12/16/2020   HCT 25.0 (L) 12/16/2020   PLT 463 (H)  12/16/2020   LYMPHOPCT 9 12/16/2020   MONOPCT 7 12/16/2020   EOSPCT 4 12/16/2020   BASOPCT 0 12/16/2020    CMP:  Lab Results  Component Value Date   NA 136 12/16/2020   K 4.2 12/16/2020   CL 104 12/16/2020   CO2 26 12/16/2020   BUN 19 12/16/2020   CREATININE 1.28 (H) 12/16/2020   PROT 5.5 (L) 12/16/2020   ALBUMIN 2.4 (L) 12/16/2020   BILITOT 0.7 12/16/2020   ALKPHOS 69 12/16/2020   AST 15 12/16/2020   ALT 10 12/16/2020  .   Total Time in preparing paper work, data evaluation and todays exam - 9 minutes  Lala Lund M.D on 12/18/2020 at 8:51 AM  [facility]

## 2020-12-18 NOTE — TOC Transition Note (Addendum)
Transition of Care [facility]) - CM/SW Discharge Note   Patient Details  Name: KEDERICK DEBARGE MRN: [id number] Date of Birth: Feb 11, 1928  [facility] CM/SW Contact:  Candie Chroman, LCSW Phone Number: 12/18/2020, 1:11 PM   Clinical Narrative:   Patient has orders to discharge to [facility] today. RN will call report to [phone] (Room 610A). EMS transport has been arranged and he is 7th on the list. Tried calling daughter to update but voicemail is full. Tried calling patient in the room but he could not hear me. Asked RN to update him next time he goes in there. No further concerns. CSW signing off.  1:36 pm: Called and updated daughter.  Final next level of care: Skilled Nursing Facility Barriers to Discharge: Barriers Resolved   Patient Goals and CMS Choice Patient states their goals for this hospitalization and ongoing recovery are:: to continue rehab after discharge CMS Medicare.gov Compare Post Acute Care list provided to:: Patient Choice offered to / list presented to : Adult Children  Discharge Placement   Existing PASRR number confirmed : 12/18/20          Patient chooses bed at: [facility] [place] Patient to be transferred to facility by: EMS Name of family member notified: Sherlyn Lees - Voicemail is full Patient and family notified of of transfer: 12/18/20  Discharge Plan and Services     Post Acute Care Choice: [facility]                               Social Determinants of Health (SDOH) Interventions     Readmission Risk Interventions No flowsheet data found.

## 2020-12-18 NOTE — Care Management Important Message (Signed)
Important Message  Patient Details  Name: Jacob Moses MRN: [id number] Date of Birth: December 05, 1927   Medicare Important Message Given:  Yes  Patient is in an isolation room so I called his room [phone]) and talked with his daughter, [name] and she said they were in agreement with the discharge plan for today.  I asked her if she would like a copy of the form and she replied no.  I wished her father well and thanked her for her time.  [name] 12/18/2020, 10:37 AM

## 2020-12-19 LAB — CULTURE, BLOOD (ROUTINE X 2)
Culture: NO GROWTH
Culture: NO GROWTH
Special Requests: ADEQUATE
Special Requests: ADEQUATE

## 2021-01-01 DIAGNOSIS — N401 Enlarged prostate with lower urinary tract symptoms: Secondary | ICD-10-CM | POA: Diagnosis present

## 2021-01-08 ENCOUNTER — Other Ambulatory Visit: Payer: Self-pay

## 2021-01-08 ENCOUNTER — Ambulatory Visit: Payer: Medicare Other | Admitting: Physician Assistant

## 2021-01-08 ENCOUNTER — Ambulatory Visit (INDEPENDENT_AMBULATORY_CARE_PROVIDER_SITE_OTHER): Payer: Medicare Other | Admitting: Physician Assistant

## 2021-01-08 DIAGNOSIS — N401 Enlarged prostate with lower urinary tract symptoms: Secondary | ICD-10-CM

## 2021-01-08 DIAGNOSIS — R338 Other retention of urine: Secondary | ICD-10-CM

## 2021-01-08 LAB — BLADDER SCAN AMB NON-IMAGING

## 2021-01-08 NOTE — Progress Notes (Signed)
01/08/2021 9:23 AM   Jacob Moses Nov 25, 1927 [id number]  CC: Chief Complaint  Patient presents with   Benign Prostatic Hypertrophy   HPI: Jacob Moses is a 87 y.o. male with a recent history of urinary retention during hospitalization after a fall in which he sustained a right femoral neck fracture, s/p right hip hemiarthroplasty on 11/29/2020, who passed an outpatient voiding trial on 12/12/2020 and was then readmitted with recurrent retention and urosepsis who presents today for outpatient voiding trial.  He is accompanied today by his daughter, who contributes to HPI.  Inpatient urine culture finalized with multidrug-resistant Morganella morganii and pansensitive Staph aureus.  Blood cultures finalized with no growth at 5 days.  He was treated with vancomycin and cefepime and discharged on Bactrim.  Today he reports no acute concerns.  He is eager to have his catheter removed.  Foley catheter removed in clinic in the morning, see procedure note below for more information.  He returned to clinic in the afternoon.  He reports drinking 40oz of fluid.  He has been able to void and describes some dribbling.  PVR 284m; he denies lower abdominal pain.  He remains on Flomax and finasteride.  PMH: Past Medical History:  Diagnosis Date   H/O vertigo    History of stomach ulcers    HOH (hard of hearing)    Hypertension    Insomnia    Wears dentures     Surgical History: Past Surgical History:  Procedure Laterality Date   APPENDECTOMY  1999   [place] 2010   CATARACT EXTRACTION W/PHACO Left 09/13/2019   Procedure: CATARACT EXTRACTION PHACO AND INTRAOCULAR LENS PLACEMENT (IOC) LEFT  5.06  00:48.8;  Surgeon: PBirder Robson MD;  [place] [facility]  Service: Ophthalmology;  Laterality: Left;   CATARACT EXTRACTION W/PHACO Right 10/04/2019   Procedure: CATARACT EXTRACTION PHACO AND INTRAOCULAR LENS PLACEMENT ([place] RIGHT;  Surgeon: PBirder Robson MD;  [facility] [facility]  Service: Ophthalmology;  Laterality: Right;  4.64 0:43.9   ECTROPION REPAIR Bilateral 04/24/2015   Procedure: REPAIR OF ECTROPION TARSAL WEDGE LEFT EYE REPAIR EXTENSIVE BILATERAL;  Surgeon: AKarle Starch MD;  [place] [facility]  Service: Ophthalmology;  Laterality: Bilateral;   HERNIA REPAIR  1999   Dr.Byrnett   HIP ARTHROPLASTY Right 11/29/2020   Procedure: ARTHROPLASTY BIPOLAR HIP (HEMIARTHROPLASTY)-Extra Scrub;  Surgeon: PLeim Fabry MD;  Location: [facility];  Service: Orthopedics;  Laterality: Right;   LACRIMAL DUCT EXPLORATION Bilateral 04/24/2015   Procedure: LACRIMAL DUCT EXPLORATION PUNCTOPLASTY;  Surgeon: AKarle Starch MD;  [place] [facility]  Service: Ophthalmology;  Laterality: Bilateral;   SEPTOPLASTY     STOMACH SURGERY  1966   ulcer    Home Medications:  Allergies as of 01/08/2021   No Known Allergies      Medication List        Accurate as of January 08, 2021  9:23 AM. If you have any questions, ask your nurse or doctor.          ALPRAZolam 0.5 MG 24 hr tablet Commonly known as: XANAX XR Take 1 tablet (0.5 mg total) by mouth daily.   cholecalciferol 25 MCG (1000 UNIT) tablet Commonly known as: VITAMIN D3 Take 2,000 Units by mouth daily.   docusate sodium 100 MG capsule Commonly known as: COLACE Take 100 mg by mouth 2 (two) times daily.   enoxaparin 40 MG/0.4ML injection Commonly known as: LOVENOX Inject 0.3 mLs (30 mg  total) into the skin daily for 14 days.   finasteride 5 MG tablet Commonly known as: PROSCAR Take 1 tablet (5 mg total) by mouth daily.   lactulose 10 GM/15ML solution Commonly known as: CHRONULAC Take 20 g by mouth daily as needed for mild constipation.   multivitamin with minerals tablet Take 1 tablet by mouth daily.   ondansetron 4 MG tablet Commonly known as: ZOFRAN Take 1 tablet (4 mg total) by mouth every 6 (six) hours as needed for nausea.   oxyCODONE 5  MG immediate release tablet Commonly known as: Oxy IR/ROXICODONE Take 0.5 tablets (2.5 mg total) by mouth every 6 (six) hours as needed for breakthrough pain or severe pain (pain score 4-6).   polyethylene glycol 17 g packet Commonly known as: MIRALAX / GLYCOLAX Take 17 g by mouth daily as needed.   tamsulosin 0.4 MG Caps capsule Commonly known as: FLOMAX Take 0.4 mg by mouth daily.   traMADol 50 MG tablet Commonly known as: ULTRAM Take 1 tablet (50 mg total) by mouth every 12 (twelve) hours as needed for moderate pain.   traZODone 50 MG tablet Commonly known as: DESYREL Take 0.5 tablets (25 mg total) by mouth at bedtime as needed for sleep.   zinc oxide 20 % ointment Apply 1 application topically 2 (two) times daily as needed for irritation.        Allergies:  No Known Allergies  Family History: Family History  Problem Relation Age of Onset   Cancer Mother    Diabetes Father     Social History:   reports that he quit smoking about 42 years ago. His smoking use included cigarettes. He has never used smokeless tobacco. He reports that he does not drink alcohol and does not use drugs.  Physical Exam: There were no vitals taken for this visit.  Constitutional:  Alert and oriented, no acute distress, nontoxic appearing HEENT: [place], AT Cardiovascular: No clubbing, cyanosis, or edema Respiratory: Normal respiratory effort, no increased work of breathing Skin: No rashes, bruises or suspicious lesions Neurologic: Grossly intact, no focal deficits, moving all 4 extremities Psychiatric: Normal mood and affect  Laboratory Data: Results for orders placed or performed in visit on 01/08/21  Bladder Scan (Post Void Residual) in office  Result Value Ref Range   Scan Result 238m    Catheter Removal  Patient is present today for a catheter removal.  855mof water was drained from the balloon. A 16FR foley cath was removed from the bladder no complications were noted . Patient  tolerated well.  Performed by: SaDebroah LoopPA-C   Assessment & Plan:   1. Benign prostatic hyperplasia with urinary retention Voiding trial equivocal today.  I offered the patient Foley catheter removal with repeat voiding trial in 1 week versus trial without catheter with repeat PVR in 2 days.  He elected for the latter.  We discussed that the risks of this included recurrent urinary retention and/or urinary tract infection.  He expressed understanding.  Counseled the patient to return to clinic sooner if he develops difficulty urinating and lower abdominal pain.  Counseled him to proceed to the emergency department immediately if this occurs outside of office hours.  He expressed understanding.  Return in about 2 days (around 01/10/2021) for Repeat PVR.  SaDebroah LoopPA-C  [facility] [address] StreetSuRudduDiamondNC [phone]

## 2021-01-08 NOTE — Patient Instructions (Signed)
You may go back to your normal fluid intake.  I'll plan to see you back in clinic on Thursday to scan your bladder again. If you develop difficulty urinating and lower abdominal pain before then, either call our office for immediate assistance if during office hours (8a-5p) or go straight to the Emergency Department.

## 2021-01-10 ENCOUNTER — Ambulatory Visit: Payer: Self-pay | Admitting: Physician Assistant

## 2021-01-10 ENCOUNTER — Ambulatory Visit (INDEPENDENT_AMBULATORY_CARE_PROVIDER_SITE_OTHER): Payer: Medicare Other | Admitting: Physician Assistant

## 2021-01-10 ENCOUNTER — Other Ambulatory Visit: Payer: Self-pay

## 2021-01-10 VITALS — BP 124/82 | HR 0 | Ht 72.0 in | Wt 130.0 lb

## 2021-01-10 DIAGNOSIS — N401 Enlarged prostate with lower urinary tract symptoms: Secondary | ICD-10-CM

## 2021-01-10 DIAGNOSIS — R338 Other retention of urine: Secondary | ICD-10-CM | POA: Diagnosis not present

## 2021-01-10 LAB — BLADDER SCAN AMB NON-IMAGING: SCA Result: 505

## 2021-01-10 MED ORDER — CIPROFLOXACIN HCL 500 MG PO TABS
500.0000 mg | ORAL_TABLET | Freq: Once | ORAL | Status: AC
  Administered 2021-01-10: 500 mg via ORAL

## 2021-01-10 MED ORDER — CIPROFLOXACIN HCL 250 MG PO TABS: 250.0000 mg | ORAL_TABLET | Freq: Two times a day (BID) | ORAL | 0 refills | Status: AC

## 2021-01-10 NOTE — Progress Notes (Signed)
Patient returned to clinic today for repeat PVR 2 days after voiding trial in clinic with me with equivocal afternoon follow-up.  He is accompanied again by his daughter, who contributes to HPI.  Patient reports he has been able to urinate over the past 2 days.  He reports some painful urinary urgency, but denies dysuria and states he has been able to void without difficulty.  PVR 505 mL.  On physical exam, he has a distended urinary bladder palpable below the umbilicus.  Results for orders placed or performed in visit on 01/10/21  Bladder Scan (Post Void Residual) in office  Result Value Ref Range   SCA Result 505     I explained to the patient that he has back and urinary retention at this point.  I do not recommend conservative management.  Instead, I offered him Foley catheter replacement with plans for repeat voiding trial in 2 weeks versus CIC teaching.  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  Simple Catheter Placement  Due to urinary retention patient is present today for a foley cath placement.  Patient was cleaned and prepped in a sterile fashion with betadine and 2% lidocaine jelly was instilled into the urethra. A 16 FR coude foley catheter was inserted, urine return was noted  440m, urine was cloudy yellow in color.  The balloon was filled with 10cc of sterile water.  A night bag was attached for drainage. Patient tolerated well, no complications were noted   Performed by: SDebroah Loop PA-C   Additional notes: Urine efflux noted to be cloudy on physical exam today.  While he denies infective symptoms, given his recent history of urinary retention and urosepsis, I obtained a urine sample from his catheter for UA and culture today.  I treated him with 500 mg of Cipro in clinic today for UTI prevention and would like him to follow-up with Cipro 250 mg twice daily outpatient for total  of 3 days of empiric therapy for UTI prevention.  Patient expressed understanding.  Follow up: Return in about 2 weeks (around 01/24/2021) for Voiding trial.    I spent 25 minutes on the day of the encounter to include pre-visit record review, face-to-face time with the patient, and post-visit ordering of tests.

## 2021-01-11 LAB — URINALYSIS, COMPLETE
Bilirubin, UA: NEGATIVE
Glucose, UA: NEGATIVE
Ketones, UA: NEGATIVE
Nitrite, UA: POSITIVE — AB
Specific Gravity, UA: 1.02 (ref 1.005–1.030)
Urobilinogen, Ur: 0.2 mg/dL (ref 0.2–1.0)
pH, UA: 7 (ref 5.0–7.5)

## 2021-01-11 LAB — MICROSCOPIC EXAMINATION: WBC, UA: >30 /hpf — AB (ref 0–5)

## 2021-01-15 ENCOUNTER — Telehealth: Payer: Self-pay

## 2021-01-15 LAB — CULTURE, URINE COMPREHENSIVE

## 2021-01-15 NOTE — Telephone Encounter (Signed)
-----   Message from Debroah Loop, [place] sent at 01/15/2021 11:50 AM EDT ----- His culture grew bacteria, but Cipro was appropriate to treat it. Can you please call him and make sure he isn't having any infective symptoms? ----- Message ----- From: Interface, [facility] Lab Results In Sent: 01/11/2021   5:42 AM EDT To: Debroah Loop, PA-C

## 2021-01-15 NOTE — Telephone Encounter (Signed)
Relayed the message below to patients daughter, she voiced understanding.

## 2021-01-15 NOTE — Telephone Encounter (Signed)
Spoke with patients daughter [name] who reports patient is not currently having any infective symptoms. Daughter would like to note that patient was not experiencing infective symptoms before patient was given 3 days of Cipro. Daughter is wondering how to know if infection is cleared or not.

## 2021-01-15 NOTE — Telephone Encounter (Signed)
I agree, he did not have signs of infection when I saw him. The Cipro was for UTI prevention and given out of an abundance of caution given his recurrent retention, recent infection, and instrumentation with Foley replacement. The purpose was not to treat an active infection as I do not believe there was one. Asymptomatic bacteria in the urine is different than true infection (and incredibly common in catheterized patients), and we don't need to do anything about it in the absence of infection symptoms including lower abdominal pain, low back pain, fever, chills, nausea, or vomiting.

## 2021-01-23 ENCOUNTER — Other Ambulatory Visit: Payer: Self-pay

## 2021-01-23 ENCOUNTER — Ambulatory Visit: Payer: Medicare Other | Admitting: Physician Assistant

## 2021-01-23 ENCOUNTER — Encounter: Payer: Self-pay | Admitting: Urology

## 2021-01-23 ENCOUNTER — Ambulatory Visit (INDEPENDENT_AMBULATORY_CARE_PROVIDER_SITE_OTHER): Payer: Medicare Other | Admitting: Physician Assistant

## 2021-01-23 ENCOUNTER — Ambulatory Visit (INDEPENDENT_AMBULATORY_CARE_PROVIDER_SITE_OTHER): Payer: Medicare Other | Admitting: Urology

## 2021-01-23 VITALS — BP 138/87 | HR 80 | Ht 72.0 in | Wt 130.0 lb

## 2021-01-23 DIAGNOSIS — R338 Other retention of urine: Secondary | ICD-10-CM

## 2021-01-23 DIAGNOSIS — N401 Enlarged prostate with lower urinary tract symptoms: Secondary | ICD-10-CM

## 2021-01-23 LAB — BLADDER SCAN AMB NON-IMAGING: Scan Result: 338

## 2021-01-23 NOTE — Progress Notes (Signed)
16Simple Catheter Placement  Due to urinary retention patient is present today for a foley cath placement.  Patient was cleaned and prepped in a sterile fashion with betadine. A 16 coude foley catheter was inserted, urine return was noted  368ml, urine was yellow in color.  The balloon was filled with 10cc of sterile water.  A night bag was attached for drainage. Patient was given instruction on proper catheter care.  Patient tolerated well, no complications were noted   Performed by: Elberta Leatherwood, [place]

## 2021-01-23 NOTE — Progress Notes (Signed)
Catheter Removal  Patient is present today for a catheter removal.  74ml of water was drained from the balloon. A 16FR coude foley cath was removed from the bladder no complications were noted . Patient tolerated well.  Performed by: Bradly Bienenstock CMA  Follow up/ Additional notes: RTC this afternoon for PVR.

## 2021-01-23 NOTE — Patient Instructions (Signed)
What is a suprapubic catheter?  A suprapubic catheter (sometimes called an [facility]) is a device that's inserted into your bladder to drain urine if you can't urinate on your own.  Normally, a catheter is inserted into your bladder through your urethra, the tube that you usually urinate out of. An SPC is inserted a couple of inches below your navel, or belly button, directly into your bladder, just above your pubic bone. This allows urine to be drained without having a tube going through your genital area.  SPCs are usually more comfortable than regular catheters because they aren't inserted through your urethra, which is full of sensitive tissue. Your doctor may use an [facility] if your urethra isn't able to safely hold a catheter.  What is a suprapubic catheter used for? An SPC drains urine directly out of your bladder if you're not able to urinate by yourself. Some conditions that may require you to use a catheter include:  urinary retention (can't urinate on your own) urinary incontinence (leakage) pelvic organ prolapse spinal injuries or trauma lower body paralysis multiple sclerosis (MS) Parkinson's disease benign prostatic hyperplasia (BPH) bladder cancer  How is this device inserted? Your doctor will insert and change your catheter the first few times after you're given one. Then, your doctor may permit you to take care of your catheter at home.  First, your doctor may take X-rays or perform an ultrasound on the area to check for any abnormalities around your bladder area.  Your doctor will likely use the Stamey procedure to insert your catheter if your bladder is distended. This means that it's overfilled with urine. In this procedure, your doctor:  Prepares the bladder area with iodine and cleaning solution. Locates your bladder by gently feeling around the area. Uses local anesthesia to numb the area. Inserts a catheter using a Stamey device. This helps guide the catheter in with a  piece of metal called an obturator. Removes the obturator once the catheter is in your bladder. Inflates a balloon at the end of the catheter with water to keep it from falling out. Cleans the insertion area and stitches up the opening. Your doctor may also give you a bag that's attached to your leg for the urine to drain into. In some cases, the catheter itself may simply have a valve on it that allows you to drain the urine into a toilet whenever needed.  Are there any possible complications? SPC insertion is a short, safe procedure that usually has few complications. Before the insertion, your doctor may recommend taking antibiotics if you've had a heart valve replacement or are taking any blood thinners.  Possible minor complications of an SPC insertion include:  urine not draining properly urine leaking out of your catheter small amounts of blood in your urine You may be required to stay in the clinic or hospital if your doctor notices any complications that need immediate treatment, such as:  high fever abnormal abdominal pain infection discharge from the insertion area or urethra internal bleeding (hemorrhage) a hole in the bowel area (perforation) stones or pieces of tissue in your urine See your doctor as soon as possible if your catheter falls out at home, as it needs to be reinserted so that the opening doesn't close.

## 2021-01-24 ENCOUNTER — Ambulatory Visit: Payer: Medicare Other | Admitting: Physician Assistant

## 2021-01-24 NOTE — Progress Notes (Signed)
01/23/2021 6:49 AM   Jacob Moses 19-Mar-1928 [id number]  Referring provider: Albina Billet, MD [address]   [place],  [place] [address]  Chief Complaint  Patient presents with   Benign Prostatic Hypertrophy    HPI: 87 y.o. male presents for follow-up of recurrent urinary retention.  He presents with his daughter  Catheter was removed this morning and he has voided a minimal amount Estimated volume by bladder scan was 338 mL   PMH: Past Medical History:  Diagnosis Date   H/O vertigo    History of stomach ulcers    HOH (hard of hearing)    Hypertension    Insomnia    Wears dentures     Surgical History: Past Surgical History:  Procedure Laterality Date   APPENDECTOMY  1999   Dr.Byrnett   BELPHAROPTOSIS REPAIR  2010   CATARACT EXTRACTION W/PHACO Left 09/13/2019   Procedure: CATARACT EXTRACTION PHACO AND INTRAOCULAR LENS PLACEMENT (IOC) LEFT  5.06  00:48.8;  Surgeon: Birder Robson, MD;  Location: [facility];  Service: Ophthalmology;  Laterality: Left;   CATARACT EXTRACTION W/PHACO Right 10/04/2019   Procedure: CATARACT EXTRACTION PHACO AND INTRAOCULAR LENS PLACEMENT ([place]) RIGHT;  Surgeon: Birder Robson, MD;  Location: [facility];  Service: Ophthalmology;  Laterality: Right;  4.64 0:43.9   ECTROPION REPAIR Bilateral 04/24/2015   Procedure: REPAIR OF ECTROPION TARSAL WEDGE LEFT EYE REPAIR EXTENSIVE BILATERAL;  Surgeon: Karle Starch, MD;  Location: [place];  Service: Ophthalmology;  Laterality: Bilateral;   HERNIA REPAIR  1999   Dr.Byrnett   HIP ARTHROPLASTY Right 11/29/2020   Procedure: ARTHROPLASTY BIPOLAR HIP (HEMIARTHROPLASTY)-Extra Scrub;  Surgeon: Leim Fabry, MD;  Location: [facility];  Service: Orthopedics;  Laterality: Right;   LACRIMAL DUCT EXPLORATION Bilateral 04/24/2015   Procedure: LACRIMAL DUCT EXPLORATION PUNCTOPLASTY;  Surgeon: Karle Starch, MD;  Location: [place];  Service: Ophthalmology;   Laterality: Bilateral;   SEPTOPLASTY     STOMACH SURGERY  1966   ulcer    Home Medications:  Allergies as of 01/23/2021   No Known Allergies      Medication List        Accurate as of January 23, 2021 11:59 PM. If you have any questions, ask your nurse or doctor.          ALPRAZolam 0.5 MG 24 hr tablet Commonly known as: XANAX XR Take 1 tablet (0.5 mg total) by mouth daily.   cholecalciferol 25 MCG (1000 UNIT) tablet Commonly known as: VITAMIN D3 Take 2,000 Units by mouth daily.   docusate sodium 100 MG capsule Commonly known as: COLACE Take 100 mg by mouth 2 (two) times daily.   enoxaparin 40 MG/0.4ML injection Commonly known as: LOVENOX Inject 0.3 mLs (30 mg total) into the skin daily for 14 days.   finasteride 5 MG tablet Commonly known as: PROSCAR Take 1 tablet (5 mg total) by mouth daily.   lactulose 10 GM/15ML solution Commonly known as: CHRONULAC Take 20 g by mouth daily as needed for mild constipation.   multivitamin with minerals tablet Take 1 tablet by mouth daily.   ondansetron 4 MG tablet Commonly known as: ZOFRAN Take 1 tablet (4 mg total) by mouth every 6 (six) hours as needed for nausea.   oxyCODONE 5 MG immediate release tablet Commonly known as: Oxy IR/ROXICODONE Take 0.5 tablets (2.5 mg total) by mouth every 6 (six) hours as needed for breakthrough pain or severe pain (pain score 4-6).  polyethylene glycol 17 g packet Commonly known as: MIRALAX / GLYCOLAX Take 17 g by mouth daily as needed.   tamsulosin 0.4 MG Caps capsule Commonly known as: FLOMAX Take 0.4 mg by mouth daily.   traMADol 50 MG tablet Commonly known as: ULTRAM Take 1 tablet (50 mg total) by mouth every 12 (twelve) hours as needed for moderate pain.   traZODone 50 MG tablet Commonly known as: DESYREL Take 0.5 tablets (25 mg total) by mouth at bedtime as needed for sleep.   zinc oxide 20 % ointment Apply 1 application topically 2 (two) times daily as needed for  irritation.        Allergies: No Known Allergies  Family History: Family History  Problem Relation Age of Onset   Cancer Mother    Diabetes Father     Social History:  reports that he quit smoking about 42 years ago. His smoking use included cigarettes. He has never used smokeless tobacco. He reports that he does not drink alcohol and does not use drugs.   Physical Exam: BP 138/87   Pulse 80   Ht 6' (1.829 m)   Wt 130 lb (59 kg)   BMI 17.63 kg/m   Constitutional:  Alert and oriented, No acute distress. HEENT: [place] AT, moist mucus membranes.  Trachea midline, no masses. Cardiovascular: No clubbing, cyanosis, or edema. Respiratory: Normal respiratory effort, no increased work of breathing. GI: Abdomen is soft, nontender, nondistended, no abdominal masses GU: No CVA tenderness Lymph: No cervical or inguinal lymphadenopathy. Skin: No rashes, bruises or suspicious lesions. Neurologic: Grossly intact, no focal deficits, moving all 4 extremities. Psychiatric: Normal mood and affect.    Assessment & Plan:    1. Benign prostatic hyperplasia with urinary retention Recurrent retention Recommended replacement of his Foley catheter Long-term options were discussed including Foley catheter drainage and suprapubic tube placement Outlet procedures were discussed however he has at increased surgical risk.  Would recommend a urodynamic study prior to any outlet procedure to assess detrusor tone He does not desire to proceed with surgery and is interested in scheduling placement of a suprapubic tube   Abbie Sons, MD  [facility] [address], [place], [place] [address] [phone]

## 2021-01-29 ENCOUNTER — Encounter: Payer: Self-pay | Admitting: Urology

## 2021-01-30 ENCOUNTER — Telehealth: Payer: Self-pay

## 2021-01-30 DIAGNOSIS — R339 Retention of urine, unspecified: Secondary | ICD-10-CM

## 2021-01-30 NOTE — Telephone Encounter (Signed)
Order placed, form faxed to IR and message sent to scheduling awaiting response

## 2021-01-30 NOTE — Telephone Encounter (Signed)
-----   Message from Abbie Sons, MD sent at 01/29/2021  8:56 PM EDT ----- Regarding: SP Tube Please SP tube placement with IR  Dx: urinary retention- has Foley

## 2021-01-31 NOTE — Telephone Encounter (Signed)
Update from scheduling, patient is currently scheduled for Wed 10/5 at [facility] and to arrive at [place].  The IR Nurse will call a day or two prior to the procedure to go over instructions.

## 2021-02-01 NOTE — Telephone Encounter (Signed)
Patient's daughter notified, instructions reviewed NPO from midnight on, must have a driver. Confirmed patient is no longer on Lovenox or any other blood thinners.

## 2021-02-05 ENCOUNTER — Other Ambulatory Visit: Payer: Self-pay | Admitting: Radiology

## 2021-02-06 ENCOUNTER — Ambulatory Visit
Admission: RE | Admit: 2021-02-06 | Discharge: 2021-02-06 | Disposition: A | Discharge status: Home / Self Care | Payer: Medicare Other | Source: Ambulatory Visit | Attending: Urology | Admitting: Urology

## 2021-02-06 ENCOUNTER — Other Ambulatory Visit: Payer: Self-pay

## 2021-02-06 DIAGNOSIS — R339 Retention of urine, unspecified: Secondary | ICD-10-CM | POA: Insufficient documentation

## 2021-02-06 DIAGNOSIS — Z79899 Other long term (current) drug therapy: Secondary | ICD-10-CM | POA: Insufficient documentation

## 2021-02-06 DIAGNOSIS — N401 Enlarged prostate with lower urinary tract symptoms: Secondary | ICD-10-CM | POA: Insufficient documentation

## 2021-02-06 DIAGNOSIS — Z87891 Personal history of nicotine dependence: Secondary | ICD-10-CM | POA: Insufficient documentation

## 2021-02-06 LAB — CBC
HCT: 36.2 % — ABNORMAL LOW (ref 39.0–52.0)
Hemoglobin: 11.9 g/dL — ABNORMAL LOW (ref 13.0–17.0)
MCH: 32.8 pg (ref 26.0–34.0)
MCHC: 32.9 g/dL (ref 30.0–36.0)
MCV: 99.7 fL (ref 80.0–100.0)
Platelets: 304 10*3/uL (ref 150–400)
RBC: 3.63 MIL/uL — ABNORMAL LOW (ref 4.22–5.81)
RDW: 13.9 % (ref 11.5–15.5)
WBC: 6.4 10*3/uL (ref 4.0–10.5)
nRBC: 0 % (ref 0.0–0.2)

## 2021-02-06 LAB — PROTIME-INR
INR: 1 (ref 0.8–1.2)
Prothrombin Time: 13.6 seconds (ref 11.4–15.2)

## 2021-02-06 MED ORDER — HYDROCODONE-ACETAMINOPHEN 5-325 MG PO TABS
1.0000 | ORAL_TABLET | ORAL | Status: DC | PRN
  Filled 2021-02-06: qty 2

## 2021-02-06 MED ORDER — FENTANYL CITRATE (PF) 100 MCG/2ML IJ SOLN
INTRAMUSCULAR | Status: DC | PRN
  Administered 2021-02-06: 25 ug via INTRAVENOUS

## 2021-02-06 MED ORDER — SODIUM CHLORIDE 0.9 % IV SOLN: INTRAVENOUS | Status: DC

## 2021-02-06 MED ORDER — FENTANYL CITRATE (PF) 100 MCG/2ML IJ SOLN
INTRAMUSCULAR | Status: AC
  Filled 2021-02-06: qty 2

## 2021-02-06 MED ORDER — SODIUM CHLORIDE 0.9% FLUSH: 5.0000 mL | Freq: Three times a day (TID) | INTRAVENOUS | Status: DC

## 2021-02-06 MED ORDER — MIDAZOLAM HCL 2 MG/2ML IJ SOLN
INTRAMUSCULAR | Status: AC
  Filled 2021-02-06: qty 2

## 2021-02-06 NOTE — H&P (Signed)
Chief Complaint: Patient was seen in consultation today for urinary retention at the request of Stoioff,Scott C  Referring Physician(s): Stoioff,Scott C  Supervising Physician: Arne Cleveland  Patient Status: [facility] - Out-pt  History of Present Illness: Jacob Moses is a 87 y.o. male with BPH and urinary retention with chronic indwelling foley catheter, seen by Urology 9/21 and decision made to transition from foley to suprapubic catheter. The patient has had a H&P performed within the last 30 days, all history, medications, and exam have been reviewed. The patient denies any interval changes since the H&P.   Past Medical History:  Diagnosis Date   H/O vertigo    History of stomach ulcers    HOH (hard of hearing)    Hypertension    Insomnia    Wears dentures     Past Surgical History:  Procedure Laterality Date   APPENDECTOMY  1999   [place]  2010   CATARACT EXTRACTION W/PHACO Left 09/13/2019   Procedure: CATARACT EXTRACTION PHACO AND INTRAOCULAR LENS PLACEMENT (IOC) LEFT  5.06  00:48.8;  Surgeon: Birder Robson, MD;  Location: [place];  Service: Ophthalmology;  Laterality: Left;   CATARACT EXTRACTION W/PHACO Right 10/04/2019   Procedure: CATARACT EXTRACTION PHACO AND INTRAOCULAR LENS PLACEMENT ([place]) RIGHT;  Surgeon: Birder Robson, MD;  Location: [place];  Service: Ophthalmology;  Laterality: Right;  4.64 0:43.9   ECTROPION REPAIR Bilateral 04/24/2015   Procedure: REPAIR OF ECTROPION TARSAL WEDGE LEFT EYE REPAIR EXTENSIVE BILATERAL;  Surgeon: Karle Starch, MD;  Location: [place];  Service: Ophthalmology;  Laterality: Bilateral;   HERNIA REPAIR  1999   Dr.Byrnett   HIP ARTHROPLASTY Right 11/29/2020   Procedure: ARTHROPLASTY BIPOLAR HIP (HEMIARTHROPLASTY)-Extra Scrub;  Surgeon: Leim Fabry, MD;  Location: [facility];  Service: Orthopedics;  Laterality: Right;   LACRIMAL DUCT EXPLORATION Bilateral  04/24/2015   Procedure: LACRIMAL DUCT EXPLORATION PUNCTOPLASTY;  Surgeon: Karle Starch, MD;  Location: [place];  Service: Ophthalmology;  Laterality: Bilateral;   SEPTOPLASTY     STOMACH SURGERY  1966   ulcer    Allergies: Patient has no known allergies.  Medications: Prior to Admission medications   Medication Sig Start Date End Date Taking? Authorizing Provider  ALPRAZolam (XANAX XR) 0.5 MG 24 hr tablet Take 1 tablet (0.5 mg total) by mouth daily. 12/18/20  Yes Thurnell Lose, MD  cholecalciferol (VITAMIN D3) 25 MCG (1000 UNIT) tablet Take 2,000 Units by mouth daily.   Yes [provider]  Multiple Vitamins-Minerals (MULTIVITAMIN WITH MINERALS) tablet Take 1 tablet by mouth daily.   Yes [provider]  polyethylene glycol (MIRALAX / GLYCOLAX) 17 g packet Take 17 g by mouth daily as needed.   Yes [provider]  traZODone (DESYREL) 50 MG tablet Take 0.5 tablets (25 mg total) by mouth at bedtime as needed for sleep. 02/25/19  Yes Demetrios Loll, MD  zinc oxide 20 % ointment Apply 1 application topically 2 (two) times daily as needed for irritation.   Yes [provider]  docusate sodium (COLACE) 100 MG capsule Take 100 mg by mouth 2 (two) times daily. Patient not taking: Reported on 02/06/2021    [provider]  enoxaparin (LOVENOX) 40 MG/0.4ML injection Inject 0.3 mLs (30 mg total) into the skin daily for 14 days. 12/03/20 12/17/20  Wyvonnia Dusky, MD  finasteride (PROSCAR) 5 MG tablet Take 1 tablet (5 mg total) by mouth daily. Patient not taking: Reported on 02/06/2021  12/18/20   Thurnell Lose, MD  lactulose (CHRONULAC) 10 GM/15ML solution Take 20 g by mouth daily as needed for mild constipation. Patient not taking: Reported on 02/06/2021    [provider]  ondansetron (ZOFRAN) 4 MG tablet Take 1 tablet (4 mg total) by mouth every 6 (six) hours as needed for nausea. Patient not taking: Reported on 02/06/2021 11/30/20    Reche Dixon, PA-C  oxyCODONE (OXY IR/ROXICODONE) 5 MG immediate release tablet Take 0.5 tablets (2.5 mg total) by mouth every 6 (six) hours as needed for breakthrough pain or severe pain (pain score 4-6). Patient not taking: Reported on 02/06/2021 12/18/20   Thurnell Lose, MD  tamsulosin (FLOMAX) 0.4 MG CAPS capsule Take 0.4 mg by mouth daily. Patient not taking: Reported on 02/06/2021    [provider]  traMADol (ULTRAM) 50 MG tablet Take 1 tablet (50 mg total) by mouth every 12 (twelve) hours as needed for moderate pain. Patient not taking: Reported on 02/06/2021 12/18/20   Thurnell Lose, MD     Family History  Problem Relation Age of Onset   Cancer Mother    Diabetes Father     Social History   Socioeconomic History   Marital status: Widowed    Spouse name: Not on file   Number of children: Not on file   Years of education: Not on file   Highest education level: Not on file  Occupational History   Not on file  Tobacco Use   Smoking status: Former    Years: 30.00    Types: Cigarettes    Quit date: 05/05/1978    Years since quitting: 42.7   Smokeless tobacco: Never  Vaping Use   Vaping Use: Never used  Substance and Sexual Activity   Alcohol use: No   Drug use: No   Sexual activity: Not on file  Other Topics Concern   Not on file  Social History Narrative   Not on file   Social Determinants of Health   Financial Resource Strain: Not on file  Food Insecurity: Not on file  Transportation Needs: Not on file  Physical Activity: Not on file  Stress: Not on file  Social Connections: Not on file   Review of Systems: A 12 point ROS discussed and pertinent positives are indicated in the HPI above.  All other systems are negative.  Review of Systems  Vital Signs: BP (!) 178/90   Pulse (!) 52   Temp 98.6 F (37 C)   Resp 20   Ht 5\' 11"  (1.803 m)   Wt 130 lb (59 kg)   SpO2 100%   BMI 18.13 kg/m   Physical Exam Constitutional:      Appearance:  Normal appearance.  HENT:     Head: Normocephalic and atraumatic.  Cardiovascular:     Rate and Rhythm: Normal rate and regular rhythm.  Pulmonary:     Effort: Pulmonary effort is normal. No respiratory distress.  Abdominal:     General: Abdomen is flat.     Palpations: Abdomen is soft.  Genitourinary:    Comments: Foley catheter intact with clear yellow urine Neurological:     Mental Status: He is alert and oriented to person, place, and time.    Imaging: No results found.  Labs:  CBC: Recent Labs    12/14/20 0705 12/15/20 0536 12/16/20 0532 02/06/21 1039  WBC 28.3* 19.2* 12.2* 6.4  HGB 9.0* 9.0* 8.1* 11.9*  HCT 27.4* 27.7* 25.0* 36.2*  PLT 531*  [place]    11/29/20 0837 12/13/20 2341 12/14/20 0705 02/06/21 1039  INR 1.1 1.3* 1.2 1.0  APTT 35 37* 37*  --     BMP: Recent Labs    12/13/20 2341 12/14/20 0705 12/15/20 0536 12/16/20 0532  NA 131* 132* 134* 136  K 5.3* 5.3* 4.7 4.2  CL 95* 100 103 104  CO2 28 25 26 26   GLUCOSE 147* 107* 97 97  BUN 34* 30* 25* 19  CALCIUM 8.6* 7.8* 7.8* 7.9*  CREATININE 1.61* 1.42* 1.43* 1.28*  GFRNONAA 40* 47* 46* 53*    LIVER FUNCTION TESTS: Recent Labs    12/13/20 2341 12/14/20 0705 12/15/20 0536 12/16/20 0532  BILITOT 0.9 0.9 1.0 0.7  AST 24 17 15 15   ALT 17 11 11 10   ALKPHOS 100 67 71 69  PROT 7.7 5.3* 5.6* 5.5*  ALBUMIN 3.6 2.4* 2.5* 2.4*    Assessment and Plan: 87 year old male with BPH and urinary retention with chronic indwelling foley catheter, seen by Urology 9/21 and decision made to transition from foley to suprapubic catheter.   Scheduled today for elective SPT placement with IR   The patient has been NPO, no blood thinners taken, labs and vitals have been reviewed.  Risks and benefits of image guided suprapubic catheter placement was discussed with the patient and/or patient's family including, but not limited to bleeding, infection, damage to adjacent structures  and need for additional procedure in 8 weeks.  All of the questions were answered and there is agreement to proceed.  Consent signed and in chart.   Thank you for this interesting consult.  I greatly enjoyed meeting Jacob Moses and look forward to participating in their care.  A copy of this report was sent to the requesting provider on this date.  Electronically Signed: Hedy Jacob, PA-C 02/06/2021, 11:22 AM   I spent a total of 15 Minutes in face to face in clinical consultation, greater than 50% of which was counseling/coordinating care for urinary retention.

## 2021-02-14 ENCOUNTER — Ambulatory Visit (INDEPENDENT_AMBULATORY_CARE_PROVIDER_SITE_OTHER): Payer: Medicare Other | Admitting: Physician Assistant

## 2021-02-14 ENCOUNTER — Other Ambulatory Visit: Payer: Self-pay

## 2021-02-14 ENCOUNTER — Encounter: Payer: Self-pay | Admitting: Physician Assistant

## 2021-02-14 VITALS — BP 161/70 | HR 73 | Temp 98.5°F

## 2021-02-14 DIAGNOSIS — L03311 Cellulitis of abdominal wall: Secondary | ICD-10-CM

## 2021-02-14 MED ORDER — SULFAMETHOXAZOLE-TRIMETHOPRIM 800-160 MG PO TABS: 1.0000 | ORAL_TABLET | Freq: Two times a day (BID) | ORAL | 0 refills | Status: AC

## 2021-02-14 NOTE — Progress Notes (Signed)
02/14/2021 2:48 PM   Jacob Moses 28-Apr-1928 [id number]  CC: Chief Complaint  Patient presents with   Other   HPI: Jacob Moses is a 87 y.o. male with PMH recurrent urinary retention who underwent suprapubic catheter placement with IR 8 days ago who presents today for evaluation of possible tract infection.  He is accompanied today by his daughter, who contributes to HPI.  Today he reports an approximate 2 to 3-day history of increased purulent output from his tract site with slightly worsening surrounding erythema.  He had an elevated temperature around 100.5 F yesterday, however this has spontaneously resolved today.  PMH: Past Medical History:  Diagnosis Date   H/O vertigo    History of stomach ulcers    HOH (hard of hearing)    Hypertension    Insomnia    Wears dentures     Surgical History: Past Surgical History:  Procedure Laterality Date   APPENDECTOMY  1999   [place]  2010   CATARACT EXTRACTION W/PHACO Left 09/13/2019   Procedure: CATARACT EXTRACTION PHACO AND INTRAOCULAR LENS PLACEMENT (I[facility]) LEFT  5.06  00:48.8;  Surgeon: Birder Robson, MD;  Location: [place];  Service: Ophthalmology;  Laterality: Left;   CATARACT EXTRACTION W/PHACO Right 10/04/2019   Procedure: CATARACT EXTRACTION PHACO AND INTRAOCULAR LENS PLACEMENT ([place]) RIGHT;  Surgeon: Birder Robson, MD;  Location: [place];  Service: Ophthalmology;  Laterality: Right;  4.64 0:43.9   ECTROPION REPAIR Bilateral 04/24/2015   Procedure: REPAIR OF ECTROPION TARSAL WEDGE LEFT EYE REPAIR EXTENSIVE BILATERAL;  Surgeon: Karle Starch, MD;  Location: [place];  Service: Ophthalmology;  Laterality: Bilateral;   HERNIA REPAIR  1999   Dr.Byrnett   HIP ARTHROPLASTY Right 11/29/2020   Procedure: ARTHROPLASTY BIPOLAR HIP (HEMIARTHROPLASTY)-Extra Scrub;  Surgeon: Leim Fabry, MD;  Location: [facility];  Service: Orthopedics;  Laterality: Right;    LACRIMAL DUCT EXPLORATION Bilateral 04/24/2015   Procedure: LACRIMAL DUCT EXPLORATION PUNCTOPLASTY;  Surgeon: Karle Starch, MD;  Location: [place];  Service: Ophthalmology;  Laterality: Bilateral;   SEPTOPLASTY     STOMACH SURGERY  1966   ulcer    Home Medications:  Allergies as of 02/14/2021   No Known Allergies      Medication List        Accurate as of February 14, 2021  2:48 PM. If you have any questions, ask your nurse or doctor.          ALPRAZolam 0.5 MG 24 hr tablet Commonly known as: XANAX XR Take 1 tablet (0.5 mg total) by mouth daily.   cholecalciferol 25 MCG (1000 UNIT) tablet Commonly known as: VITAMIN D3 Take 2,000 Units by mouth daily.   docusate sodium 100 MG capsule Commonly known as: COLACE Take 100 mg by mouth 2 (two) times daily.   enoxaparin 40 MG/0.4ML injection Commonly known as: LOVENOX Inject 0.3 mLs (30 mg total) into the skin daily for 14 days.   finasteride 5 MG tablet Commonly known as: PROSCAR Take 1 tablet (5 mg total) by mouth daily.   lactulose 10 GM/15ML solution Commonly known as: CHRONULAC Take 20 g by mouth daily as needed for mild constipation.   multivitamin with minerals tablet Take 1 tablet by mouth daily.   ondansetron 4 MG tablet Commonly known as: ZOFRAN Take 1 tablet (4 mg total) by mouth every 6 (six) hours as needed for nausea.   oxyCODONE 5 MG immediate release tablet Commonly known as: Oxy  IR/ROXICODONE Take 0.5 tablets (2.5 mg total) by mouth every 6 (six) hours as needed for breakthrough pain or severe pain (pain score 4-6).   polyethylene glycol 17 g packet Commonly known as: MIRALAX / GLYCOLAX Take 17 g by mouth daily as needed.   tamsulosin 0.4 MG Caps capsule Commonly known as: FLOMAX Take 0.4 mg by mouth daily.   traMADol 50 MG tablet Commonly known as: ULTRAM Take 1 tablet (50 mg total) by mouth every 12 (twelve) hours as needed for moderate pain.   traZODone 50 MG  tablet Commonly known as: DESYREL Take 0.5 tablets (25 mg total) by mouth at bedtime as needed for sleep.   zinc oxide 20 % ointment Apply 1 application topically 2 (two) times daily as needed for irritation.        Allergies:  No Known Allergies  Family History: Family History  Problem Relation Age of Onset   Cancer Mother    Diabetes Father     Social History:   reports that he quit smoking about 42 years ago. His smoking use included cigarettes. He has never used smokeless tobacco. He reports that he does not drink alcohol and does not use drugs.  Physical Exam: BP (!) 161/70   Pulse 73   Temp 98.5 F (36.9 C) (Oral)   Constitutional:  Alert and oriented, no acute distress, nontoxic appearing HEENT: [place], AT Cardiovascular: No clubbing, cyanosis, or edema Respiratory: Normal respiratory effort, no increased work of breathing GU: SPT site noted with mild surrounding erythema and scant purulent drainage.  On palpation, there is slight surrounding induration without fluctuance.  There is some purulent drainage emerging from the site of the securing stitch. Skin: No rashes, bruises or suspicious lesions Neurologic: Grossly intact, no focal deficits, moving all 4 extremities Psychiatric: Normal mood and affect  Assessment & Plan:   1. Cellulitis of abdominal wall Mild erythema and purulent output in his SP tube site.  We discussed that this more likely represents normal healing process in the setting of recently placed suprapubic catheter, however given his age, I do not think it is inappropriate to treat with a short course of empiric Bactrim for mild cellulitis.  Patient is in agreement with this plan. - sulfamethoxazole-trimethoprim (BACTRIM DS) 800-160 MG tablet; Take 1 tablet by mouth 2 (two) times daily for 5 days.  Dispense: 10 tablet; Refill: 0   Return if symptoms worsen or fail to improve.  Debroah Loop, PA-C  [facility] [address] [place], [place] [address] [phone]

## 2021-02-26 ENCOUNTER — Telehealth: Payer: Self-pay

## 2021-02-26 DIAGNOSIS — R339 Retention of urine, unspecified: Secondary | ICD-10-CM

## 2021-02-26 NOTE — Telephone Encounter (Signed)
IR states patient is scheduled for Tues 03/12/21@10am  and to arrive at [place].  Patient's daughter [name] was notified of this appointment date and time and is in agreement. Patient had initial placement of 14FR and will be up-sized to a 16FR at this next IR exchange. Patient was also scheduled for in office SPT exchange for next visit with Sam in December . Patient's daughter verbalized understanding

## 2021-02-26 NOTE — Telephone Encounter (Signed)
IR SPT upsize/exchange order placed to have done around 11/7, message sent to scheduling. Awaiting response

## 2021-03-11 NOTE — Progress Notes (Signed)
Patient on schedule for SPT upsize 03/12/2021, called and  spoke with patients daughter/Donna, with pre procedure instructions given. Daughter in agreeance for procedure to be done with only local, since very little meds given with insertion. Made aware to be here @ 0900 at [facility].stated understanding.

## 2021-03-12 ENCOUNTER — Other Ambulatory Visit: Payer: Self-pay

## 2021-03-12 ENCOUNTER — Ambulatory Visit
Admission: RE | Admit: 2021-03-12 | Discharge: 2021-03-12 | Disposition: A | Discharge status: Home / Self Care | Payer: Medicare Other | Source: Ambulatory Visit | Attending: Urology | Admitting: Urology

## 2021-03-12 DIAGNOSIS — R339 Retention of urine, unspecified: Secondary | ICD-10-CM | POA: Insufficient documentation

## 2021-03-12 DIAGNOSIS — Z435 Encounter for attention to cystostomy: Secondary | ICD-10-CM | POA: Diagnosis present

## 2021-03-12 HISTORY — PX: IR CATHETER TUBE CHANGE: IMG717

## 2021-03-12 MED ORDER — LIDOCAINE HCL (PF) 1 % IJ SOLN
INTRAMUSCULAR | Status: AC
  Administered 2021-03-12: 8 mL
  Filled 2021-03-12: qty 30

## 2021-03-12 MED ORDER — LIDOCAINE HCL (PF) 1 % IJ SOLN
INTRAMUSCULAR | Status: AC
  Filled 2021-03-12: qty 30

## 2021-03-12 MED ORDER — IOHEXOL 350 MG/ML SOLN
12.0000 mL | Freq: Once | INTRAVENOUS | Status: AC | PRN
  Administered 2021-03-12: 12 mL
  Filled 2021-03-12: qty 15

## 2021-03-12 MED ORDER — LIDOCAINE VISCOUS HCL 2 % MT SOLN
OROMUCOSAL | Status: AC
  Administered 2021-03-12: 15 mL
  Filled 2021-03-12: qty 15

## 2021-03-12 NOTE — Procedures (Signed)
Vascular and Interventional Radiology Procedure Note  Patient: Jacob Moses DOB: 08/23/1927 Medical Record Number: [id number] Note Date/Time: 03/12/21 10:16 AM   Performing Physician: Michaelle Birks, MD Assistant(s): None  Diagnosis: Routine exchange.  Procedure:  PERCUTANEOUS SUPRAPUBIC CATHETER TUBE EXCHANGE and UPSIZE  Anesthesia: Local Anesthetic Complications: None Estimated Blood Loss:  0 mL  Findings:  Successful exchange for a 57F Council SPT  under fluoroscopy.  See detailed procedure note with images in PACS. The patient tolerated the procedure well without incident or complication and was returned to Recovery in stable condition.    Michaelle Birks, MD [facility] Interventional Radiology [facility] Radiology   Pager. [place]

## 2021-03-12 NOTE — H&P (Signed)
Vascular and Interventional Radiology  PRE PROCEDURE H&P  Assessment  Plan:   Mr. Jacob Moses is a 87 y.o. year old male who will undergo [place] in Interventional Radiology.  The procedure has been fully reviewed with the patient/patient's authorized representative. The risks, benefits and alternatives have been explained, and the patient/patient's authorized representative has consented to the procedure. -- The patient will accept blood products in an emergent situation. -- The patient does not have a Do Not Resuscitate order in effect.  HPI: Mr. Jacob Moses is a 87 y.o. year old male w PMHx significant for BPH and urinary retention with prior chronic indwelling foley catheter. Pt was recommened for transition from foley to suprapubic catheter by Urology and IR CT-guided 48F pigtail SPT on 02/06/21. Pt returns for routine upsize and exchange to 36F Council catheter. ROS negative.  Informed consent was obtained, witnessed and placed in the patient's chart.  Allergies: No Known Allergies  Medications:  Current Outpatient Medications on File Prior to Encounter  Medication Sig Dispense Refill   ALPRAZolam (XANAX XR) 0.5 MG 24 hr tablet Take 1 tablet (0.5 mg total) by mouth daily. 5 tablet 0   cholecalciferol (VITAMIN D3) 25 MCG (1000 UNIT) tablet Take 2,000 Units by mouth daily.     docusate sodium (COLACE) 100 MG capsule Take 100 mg by mouth 2 (two) times daily. (Patient not taking: Reported on 02/06/2021)     enoxaparin (LOVENOX) 40 MG/0.4ML injection Inject 0.3 mLs (30 mg total) into the skin daily for 14 days. 4.2 mL 0   finasteride (PROSCAR) 5 MG tablet Take 1 tablet (5 mg total) by mouth daily. (Patient not taking: Reported on 02/06/2021)     lactulose (CHRONULAC) 10 GM/15ML solution Take 20 g by mouth daily as needed for mild constipation. (Patient not taking: Reported on 02/06/2021)     Multiple Vitamins-Minerals (MULTIVITAMIN WITH MINERALS) tablet  Take 1 tablet by mouth daily.     ondansetron (ZOFRAN) 4 MG tablet Take 1 tablet (4 mg total) by mouth every 6 (six) hours as needed for nausea. (Patient not taking: Reported on 02/06/2021) 20 tablet 0   oxyCODONE (OXY IR/ROXICODONE) 5 MG immediate release tablet Take 0.5 tablets (2.5 mg total) by mouth every 6 (six) hours as needed for breakthrough pain or severe pain (pain score 4-6). (Patient not taking: Reported on 02/06/2021) 5 tablet 0   polyethylene glycol (MIRALAX / GLYCOLAX) 17 g packet Take 17 g by mouth daily as needed.     tamsulosin (FLOMAX) 0.4 MG CAPS capsule Take 0.4 mg by mouth daily. (Patient not taking: Reported on 02/06/2021)     traMADol (ULTRAM) 50 MG tablet Take 1 tablet (50 mg total) by mouth every 12 (twelve) hours as needed for moderate pain. (Patient not taking: Reported on 02/06/2021) 10 tablet 0   traZODone (DESYREL) 50 MG tablet Take 0.5 tablets (25 mg total) by mouth at bedtime as needed for sleep.     zinc oxide 20 % ointment Apply 1 application topically 2 (two) times daily as needed for irritation.     No current facility-administered medications on file prior to encounter.    PSH:  Past Surgical History:  Procedure Laterality Date   APPENDECTOMY  1999   [place]  2010   CATARACT EXTRACTION W/PHACO Left 09/13/2019   Procedure: CATARACT EXTRACTION PHACO AND INTRAOCULAR LENS PLACEMENT (I[facility]) LEFT  5.06  00:48.8;  Surgeon: Birder Robson, MD;  Location: [facility]  [facility];  Service: Ophthalmology;  Laterality: Left;   CATARACT EXTRACTION W/PHACO Right 10/04/2019   Procedure: CATARACT EXTRACTION PHACO AND INTRAOCULAR LENS PLACEMENT ([place]) RIGHT;  Surgeon: Birder Robson, MD;  Location: [place];  Service: Ophthalmology;  Laterality: Right;  4.64 0:43.9   ECTROPION REPAIR Bilateral 04/24/2015   Procedure: REPAIR OF ECTROPION TARSAL WEDGE LEFT EYE REPAIR EXTENSIVE BILATERAL;  Surgeon: Karle Starch, MD;  Location: [place];  Service: Ophthalmology;  Laterality: Bilateral;   HERNIA REPAIR  1999   Dr.Byrnett   HIP ARTHROPLASTY Right 11/29/2020   Procedure: ARTHROPLASTY BIPOLAR HIP (HEMIARTHROPLASTY)-Extra Scrub;  Surgeon: Leim Fabry, MD;  Location: [facility];  Service: Orthopedics;  Laterality: Right;   LACRIMAL DUCT EXPLORATION Bilateral 04/24/2015   Procedure: LACRIMAL DUCT EXPLORATION PUNCTOPLASTY;  Surgeon: Karle Starch, MD;  Location: [place];  Service: Ophthalmology;  Laterality: Bilateral;   SEPTOPLASTY     STOMACH SURGERY  1966   ulcer    PMH:  Past Medical History:  Diagnosis Date   H/O vertigo    History of stomach ulcers    HOH (hard of hearing)    Hypertension    Insomnia    Wears dentures     Brief Physical Examination: Vitals:   03/12/21 0941 03/12/21 1002  BP: (!) 165/76 (!) 165/74  Pulse: (!) 52 (!) 54  Resp: 16 20  SpO2: 99% 99%   General: WD, WN elderly male in NAD HEENT: Normocephalic, atraumatic Lungs: Respirations non-labored  ASA Grade: 3 Mallampati Class: 3   Michaelle Birks, MD Vascular and Interventional Radiology Specialists [facility] Radiology   Pager. [place]

## 2021-03-12 NOTE — Progress Notes (Signed)
Patient clinically stable post SPT upsize to 16 FR balloon council placement, tolerated well. Discharge instructions given to patient and daughter/Donna post procedure. Extra leg bag sent with patient upon discharge. Vitals stable post procedure.

## 2021-04-11 ENCOUNTER — Ambulatory Visit (INDEPENDENT_AMBULATORY_CARE_PROVIDER_SITE_OTHER): Payer: Medicare Other | Admitting: Physician Assistant

## 2021-04-11 ENCOUNTER — Other Ambulatory Visit: Payer: Self-pay

## 2021-04-11 DIAGNOSIS — Z435 Encounter for attention to cystostomy: Secondary | ICD-10-CM

## 2021-04-11 NOTE — Progress Notes (Signed)
Suprapubic Cath Change  Patient is present today for a suprapubic catheter change due to urinary retention.  42ml of water was drained from the balloon, a 16FR foley cath was removed from the tract without difficulty.  Site was cleaned and prepped in a sterile fashion with betadine.  A 16FR foley cath was replaced into the tract no complications were noted. Urine return was noted, 10 ml of sterile water was inflated into the balloon and a leg bag on extension tubing was attached for drainage.  Patient tolerated well.   Performed by: Debroah Loop, PA-C   Follow up: Return in about 4 weeks (around 05/09/2021) for SPT exchange.

## 2021-05-10 ENCOUNTER — Ambulatory Visit (INDEPENDENT_AMBULATORY_CARE_PROVIDER_SITE_OTHER): Payer: Medicare Other

## 2021-05-10 ENCOUNTER — Other Ambulatory Visit: Payer: Self-pay

## 2021-05-10 DIAGNOSIS — Z435 Encounter for attention to cystostomy: Secondary | ICD-10-CM | POA: Diagnosis not present

## 2021-05-10 NOTE — Progress Notes (Signed)
Suprapubic Cath Change  Patient is present today for a suprapubic catheter change due to urinary retention.  66ml of water was drained from the balloon, a 16FR foley cath was removed from the tract with out difficulty. Site was cleaned and prepped in a sterile fashion with betadine. A 16FR foley cath was replaced into the tract no complications were noted. Urine return was noted, at 25mL. 10 ml of sterile water was inflated into the balloon and a leg bag was attached for drainage. Patient tolerated well.    Performed by: Evonnie Pat  Follow up: RTC in 1 month for SPT exchange

## 2021-06-06 NOTE — Progress Notes (Cosign Needed)
Suprapubic Cath Change  Patient is present today for a suprapubic catheter change due to urinary retention.  8 ml of water was drained from the balloon, a 16 FR foley cath was removed from the tract with out difficulty.  Site was cleaned and prepped in a sterile fashion with betadine.  A 16 FR foley cath was replaced into the tract no complications were noted. Urine return was noted, 10 ml of sterile water was inflated into the balloon and a  bag was attached for drainage.  Patient tolerated well. A night bag was given to patient and proper instruction was given on how to switch bags.    Performed by: Zara Council, PA-C   Follow up:  Return in about 4 weeks (around 07/05/2021) for SPT exchange.   I,Kailey Littlejohn,acting as a Education administrator for [employer], PA-C.,have documented all relevant documentation on the behalf of [place], PA-C,as directed by  [facility], PA-C while in the presence of SHANNON MCGOWAN, PA-C.

## 2021-06-07 ENCOUNTER — Other Ambulatory Visit: Payer: Self-pay

## 2021-06-07 ENCOUNTER — Ambulatory Visit (INDEPENDENT_AMBULATORY_CARE_PROVIDER_SITE_OTHER): Payer: Medicare Other | Admitting: Urology

## 2021-06-07 DIAGNOSIS — Z435 Encounter for attention to cystostomy: Secondary | ICD-10-CM | POA: Diagnosis not present

## 2021-06-12 ENCOUNTER — Telehealth: Payer: Self-pay | Admitting: Urology

## 2021-06-12 NOTE — Telephone Encounter (Signed)
Spoke with daughter and she is against pt having cath removed. Pt wants to urinate out his penis? Pt was told by shannon that he can have the supapubic cath plugged and that's all he's heard per daughter. Daughter will call back to schedule appt after she spoke to her father.

## 2021-06-12 NOTE — Telephone Encounter (Signed)
Spoke with daughter again and advised shannon advice and daughter wanted to schedule appt with Dr. Bernardo Heater.

## 2021-06-12 NOTE — Telephone Encounter (Signed)
Pts daughter said he wants to have his sup cath removed and would like to have it removed. She would really like for someone to give her a call back to discuss this. Advised pt she may get a call back tomorrow if not today. Her number is [phone].

## 2021-06-20 ENCOUNTER — Ambulatory Visit: Payer: Medicare Other | Admitting: Urology

## 2021-06-28 ENCOUNTER — Ambulatory Visit (INDEPENDENT_AMBULATORY_CARE_PROVIDER_SITE_OTHER): Payer: Medicare Other | Admitting: Urology

## 2021-06-28 ENCOUNTER — Other Ambulatory Visit: Payer: Self-pay

## 2021-06-28 ENCOUNTER — Encounter: Payer: Self-pay | Admitting: Urology

## 2021-06-28 VITALS — BP 182/95 | HR 61 | Ht 71.0 in | Wt 130.0 lb

## 2021-06-28 DIAGNOSIS — R339 Retention of urine, unspecified: Secondary | ICD-10-CM

## 2021-06-28 NOTE — Progress Notes (Signed)
06/28/2021 10:55 AM   Jacob Moses October 16, 1927 [id number]  Referring provider: Albina Billet, MD [address]   [place],  [place] [address]  Chief Complaint  Patient presents with   Follow-up    HPI: 88 y.o. male with chronic urinary retention and an indwelling SP tube.  He presents today with his daughter.  He is requesting his SP tube be removed because he has intermittent urgency and feels he will be able to void successfully.  He states his SP tube is inconvenient and uncomfortable   PMH: Past Medical History:  Diagnosis Date   H/O vertigo    History of stomach ulcers    HOH (hard of hearing)    Hypertension    Insomnia    Wears dentures     Surgical History: Past Surgical History:  Procedure Laterality Date   APPENDECTOMY  1999   [place]  2010   CATARACT EXTRACTION W/PHACO Left 09/13/2019   Procedure: CATARACT EXTRACTION PHACO AND INTRAOCULAR LENS PLACEMENT (IOC) LEFT  5.06  00:48.8;  Surgeon: Birder Robson, MD;  Location: [facility];  Service: Ophthalmology;  Laterality: Left;   CATARACT EXTRACTION W/PHACO Right 10/04/2019   Procedure: CATARACT EXTRACTION PHACO AND INTRAOCULAR LENS PLACEMENT ([place]) RIGHT;  Surgeon: Birder Robson, MD;  Location: [facility];  Service: Ophthalmology;  Laterality: Right;  4.64 0:43.9   ECTROPION REPAIR Bilateral 04/24/2015   Procedure: REPAIR OF ECTROPION TARSAL WEDGE LEFT EYE REPAIR EXTENSIVE BILATERAL;  Surgeon: Karle Starch, MD;  Location: [facility];  Service: Ophthalmology;  Laterality: Bilateral;   HERNIA REPAIR  1999   Dr.Byrnett   HIP ARTHROPLASTY Right 11/29/2020   Procedure: ARTHROPLASTY BIPOLAR HIP (HEMIARTHROPLASTY)-Extra Scrub;  Surgeon: Leim Fabry, MD;  Location: [facility];  Service: Orthopedics;  Laterality: Right;   IR CATHETER TUBE CHANGE  03/12/2021   LACRIMAL DUCT EXPLORATION Bilateral 04/24/2015   Procedure: LACRIMAL DUCT EXPLORATION PUNCTOPLASTY;   Surgeon: Karle Starch, MD;  Location: [facility];  Service: Ophthalmology;  Laterality: Bilateral;   SEPTOPLASTY     STOMACH SURGERY  1966   ulcer    Home Medications:  Allergies as of 06/28/2021   No Known Allergies      Medication List        Accurate as of June 28, 2021 10:55 AM. If you have any questions, ask your nurse or doctor.          STOP taking these medications    oxyCODONE 5 MG immediate release tablet Commonly known as: Oxy IR/ROXICODONE Stopped by: Abbie Sons, MD   tamsulosin 0.4 MG Caps capsule Commonly known as: FLOMAX Stopped by: Abbie Sons, MD   zinc oxide 20 % ointment Stopped by: Abbie Sons, MD       TAKE these medications    ALPRAZolam 0.5 MG 24 hr tablet Commonly known as: XANAX XR Take 1 tablet (0.5 mg total) by mouth daily.   finasteride 5 MG tablet Commonly known as: PROSCAR Take 1 tablet (5 mg total) by mouth daily.   multivitamin with minerals tablet Take 1 tablet by mouth daily.        Allergies: No Known Allergies  Family History: Family History  Problem Relation Age of Onset   Cancer Mother    Diabetes Father     Social History:  reports that he quit smoking about 43 years ago. His smoking use included cigarettes. He has never used smokeless tobacco. He reports  that he does not drink alcohol and does not use drugs.   Physical Exam: BP (!) 182/95    Pulse 61    Ht 5\' 11"  (1.803 m)    Wt 130 lb (59 kg)    BMI 18.13 kg/m   Constitutional:  Alert and oriented, No acute distress. HEENT: [place] AT, moist mucus membranes.  Trachea midline, no masses. Cardiovascular: No clubbing, cyanosis, or edema. Respiratory: Normal respiratory effort, no increased work of breathing. Psychiatric: Normal mood and affect.   Assessment & Plan:    1.  Chronic urinary retention We discussed if his SP tube was removed and he had persistent retention he would require a urethral catheter which is generally more  uncomfortable than an SP tube I recommended clogging his SP tube and giving him a void trial.  He lives in an independent living facility which does not have available nursing care.  His daughter does not think he would be able to correctly plug the SP tube Schedule a.m. office visit to place a catheter plug.  He will be given a urinal to void and return later that afternoon to unplug the catheter and measure the residual   Abbie Sons, MD  [facility] [address] [place], [place] [address] [phone]

## 2021-07-04 ENCOUNTER — Ambulatory Visit: Payer: Medicare Other

## 2021-07-05 ENCOUNTER — Ambulatory Visit: Payer: Medicare Other | Admitting: Urology

## 2021-07-08 ENCOUNTER — Ambulatory Visit (INDEPENDENT_AMBULATORY_CARE_PROVIDER_SITE_OTHER): Payer: Medicare Other | Admitting: Urology

## 2021-07-08 ENCOUNTER — Ambulatory Visit: Payer: Medicare Other

## 2021-07-08 ENCOUNTER — Other Ambulatory Visit: Payer: Self-pay

## 2021-07-08 VITALS — Ht 71.0 in | Wt 130.0 lb

## 2021-07-08 DIAGNOSIS — N401 Enlarged prostate with lower urinary tract symptoms: Secondary | ICD-10-CM | POA: Diagnosis not present

## 2021-07-08 DIAGNOSIS — R339 Retention of urine, unspecified: Secondary | ICD-10-CM | POA: Diagnosis not present

## 2021-07-08 DIAGNOSIS — R338 Other retention of urine: Secondary | ICD-10-CM

## 2021-07-08 NOTE — Progress Notes (Signed)
Pt present today for voiding trial. Pt's SPT disconnected from catheter bag and plugged. Pt provided with urinal and instructions on return.  ?

## 2021-07-10 ENCOUNTER — Encounter: Payer: Self-pay | Admitting: Urology

## 2021-07-10 NOTE — Progress Notes (Signed)
? ?07/08/2021 ?7:57 AM  ? ?Jacob Moses ?May 17, 1927 ?[id number] ? ?Referring provider: Albina Billet, MD ?[place] [address]   ?[place],  [place] [id number] ? ?Chief Complaint  ?Patient presents with  ? Other  ? ? ?HPI: ?88 y.o. male presents for follow-up of urinary retention. ? ?Refer to my prior note 06/28/2021 ?SP tube was clamped this morning and he has not voided 13 ounces since the tube was clamped ?Feels he is voiding with a good stream. ?Voided on arrival to office and residual was 128 mL ? ? ?PMH: ?Past Medical History:  ?Diagnosis Date  ? H/O vertigo   ? History of stomach ulcers   ? HOH (hard of hearing)   ? Hypertension   ? Insomnia   ? Wears dentures   ? ? ?Surgical History: ?Past Surgical History:  ?Procedure Laterality Date  ? APPENDECTOMY  1999  ? Dr.Byrnett  ? [place]  2010  ? CATARACT EXTRACTION W/PHACO Left 09/13/2019  ? Procedure: CATARACT EXTRACTION PHACO AND INTRAOCULAR LENS PLACEMENT (I[facility]) LEFT  5.06  00:48.8;  Surgeon: Birder Robson, MD;  Location: [facility];  Service: Ophthalmology;  Laterality: Left;  ? CATARACT EXTRACTION W/PHACO Right 10/04/2019  ? Procedure: CATARACT EXTRACTION PHACO AND INTRAOCULAR LENS PLACEMENT ([place]) RIGHT;  Surgeon: Birder Robson, MD;  Location: [facility];  Service: Ophthalmology;  Laterality: Right;  4.64 ?0:43.9  ? ECTROPION REPAIR Bilateral 04/24/2015  ? Procedure: REPAIR OF ECTROPION TARSAL WEDGE LEFT EYE REPAIR EXTENSIVE BILATERAL;  Surgeon: Karle Starch, MD;  Location: [facility];  Service: Ophthalmology;  Laterality: Bilateral;  ? HERNIA REPAIR  1999  ? Dr.Byrnett  ? HIP ARTHROPLASTY Right 11/29/2020  ? Procedure: ARTHROPLASTY BIPOLAR HIP (HEMIARTHROPLASTY)-Extra Scrub;  Surgeon: Leim Fabry, MD;  Location: [facility];  Service: Orthopedics;  Laterality: Right;  ? IR CATHETER TUBE CHANGE  03/12/2021  ? LACRIMAL DUCT EXPLORATION Bilateral 04/24/2015  ? Procedure: LACRIMAL DUCT EXPLORATION PUNCTOPLASTY;  Surgeon: Karle Starch, MD;  Location: [facility];  Service: Ophthalmology;  Laterality: Bilateral;  ? SEPTOPLASTY    ? [place]  ? ulcer  ? ? ?Home Medications:  ?Allergies as of 07/08/2021   ?No Known Allergies ?  ? ?  ?Medication List  ?  ? ?  ? Accurate as of July 08, 2021 11:59 PM. If you have any questions, ask your nurse or doctor.  ?  ?  ? ?  ? ?ALPRAZolam 0.5 MG 24 hr tablet ?Commonly known as: XANAX XR ?Take 1 tablet (0.5 mg total) by mouth daily. ?  ?finasteride 5 MG tablet ?Commonly known as: PROSCAR ?Take 1 tablet (5 mg total) by mouth daily. ?  ?multivitamin with minerals tablet ?Take 1 tablet by mouth daily. ?  ? ?  ? ? ?Allergies: No Known Allergies ? ?Family History: ?Family History  ?Problem Relation Age of Onset  ? Cancer Mother   ? Diabetes Father   ? ? ?Social History:  reports that he quit smoking about 43 years ago. His smoking use included cigarettes. He has never used smokeless tobacco. He reports that he does not drink alcohol and does not use drugs. ? ? ?Physical Exam: ?Ht '5\' 11"'$  (1.803 m)   Wt 130 lb (59 kg)   BMI 18.13 kg/m?   ?Constitutional:  Alert and oriented, No acute distress. ? ? ?Assessment & Plan:   ? ?1.  History of urinary retention ?Voiding spontaneously with acceptable residual ?Recommended leaving the SP tube  plugged until 07/11/2021 ?Instructed to call earlier for symptomatic retention ?Return a.m. 07/11/2021 for residual and if <300 mL will remove SP tube ? ? ?Abbie Sons, MD ? ?[facility] ?[address] ?[place],  [address] ?[phone] ? ?

## 2021-07-11 ENCOUNTER — Ambulatory Visit: Payer: Medicare Other | Admitting: Urology

## 2021-07-12 ENCOUNTER — Encounter: Payer: Self-pay | Admitting: Urology

## 2021-07-12 ENCOUNTER — Ambulatory Visit (INDEPENDENT_AMBULATORY_CARE_PROVIDER_SITE_OTHER): Payer: Medicare Other | Admitting: Urology

## 2021-07-12 ENCOUNTER — Other Ambulatory Visit: Payer: Self-pay

## 2021-07-12 VITALS — BP 130/74 | HR 84 | Ht 72.0 in | Wt 125.0 lb

## 2021-07-12 DIAGNOSIS — N401 Enlarged prostate with lower urinary tract symptoms: Secondary | ICD-10-CM

## 2021-07-12 DIAGNOSIS — R3914 Feeling of incomplete bladder emptying: Secondary | ICD-10-CM | POA: Diagnosis not present

## 2021-07-12 DIAGNOSIS — R339 Retention of urine, unspecified: Secondary | ICD-10-CM | POA: Diagnosis not present

## 2021-07-12 LAB — BLADDER SCAN AMB NON-IMAGING: Scan Result: 179

## 2021-07-12 MED ORDER — SILODOSIN 8 MG PO CAPS: 8.0000 mg | ORAL_CAPSULE | Freq: Every day | ORAL | 3 refills | Status: DC

## 2021-07-12 NOTE — Progress Notes (Signed)
? ?07/12/2021 ?8:18 AM  ? ?Jacob Moses ?12-11-1927 ?[id number] ? ?Referring provider: Albina Billet, MD ?[place] [address]   ?[place],  [place] [address] ? ?Chief Complaint  ?Patient presents with  ? Urinary Retention  ? ? ?HPI: ?88 y.o. male presents for follow-up.  He presents today with his daughter ? ?SP tube plugged 07/08/2021 and he voided successfully with a residual of 128 mL ?I recommended keeping the catheter indwelling but plugged this week ?He states he has been voiding and does not have any sensation of bladder fullness ?He voided this morning and catheter was drained with 179 mL of urine obtained ? ? ?PMH: ?Past Medical History:  ?Diagnosis Date  ? H/O vertigo   ? History of stomach ulcers   ? HOH (hard of hearing)   ? Hypertension   ? Insomnia   ? Wears dentures   ? ? ?Surgical History: ?Past Surgical History:  ?Procedure Laterality Date  ? APPENDECTOMY  1999  ? Dr.Byrnett  ? [place]  2010  ? CATARACT EXTRACTION W/PHACO Left 09/13/2019  ? Procedure: CATARACT EXTRACTION PHACO AND INTRAOCULAR LENS PLACEMENT (I[facility]) LEFT  5.06  00:48.8;  Surgeon: Birder Robson, MD;  Location: [facility];  Service: Ophthalmology;  Laterality: Left;  ? CATARACT EXTRACTION W/PHACO Right 10/04/2019  ? Procedure: CATARACT EXTRACTION PHACO AND INTRAOCULAR LENS PLACEMENT ([place]) RIGHT;  Surgeon: Birder Robson, MD;  Location: [facility];  Service: Ophthalmology;  Laterality: Right;  4.64 ?0:43.9  ? ECTROPION REPAIR Bilateral 04/24/2015  ? Procedure: REPAIR OF ECTROPION TARSAL WEDGE LEFT EYE REPAIR EXTENSIVE BILATERAL;  Surgeon: Karle Starch, MD;  Location: [facility];  Service: Ophthalmology;  Laterality: Bilateral;  ? HERNIA REPAIR  1999  ? Dr.Byrnett  ? HIP ARTHROPLASTY Right 11/29/2020  ? Procedure: ARTHROPLASTY BIPOLAR HIP (HEMIARTHROPLASTY)-Extra Scrub;  Surgeon: Leim Fabry, MD;  Location: [facility];  Service: Orthopedics;  Laterality: Right;  ? IR CATHETER TUBE CHANGE  03/12/2021  ?  LACRIMAL DUCT EXPLORATION Bilateral 04/24/2015  ? Procedure: LACRIMAL DUCT EXPLORATION PUNCTOPLASTY;  Surgeon: Karle Starch, MD;  Location: [facility];  Service: Ophthalmology;  Laterality: Bilateral;  ? SEPTOPLASTY    ? [place]  ? ulcer  ? ? ?Home Medications:  ?Allergies as of 07/12/2021   ?No Known Allergies ?  ? ?  ?Medication List  ?  ? ?  ? Accurate as of July 12, 2021  8:18 AM. If you have any questions, ask your nurse or doctor.  ?  ?  ? ?  ? ?ALPRAZolam 0.5 MG 24 hr tablet ?Commonly known as: XANAX XR ?Take 1 tablet (0.5 mg total) by mouth daily. ?  ?finasteride 5 MG tablet ?Commonly known as: PROSCAR ?Take 1 tablet (5 mg total) by mouth daily. ?  ?multivitamin with minerals tablet ?Take 1 tablet by mouth daily. ?  ?silodosin 8 MG Caps capsule ?Commonly known as: RAPAFLO ?Take 1 capsule (8 mg total) by mouth daily with breakfast. ?Started by: Abbie Sons, MD ?  ? ?  ? ? ?Allergies: No Known Allergies ? ?Family History: ?Family History  ?Problem Relation Age of Onset  ? Cancer Mother   ? Diabetes Father   ? ? ?Social History:  reports that he quit smoking about 43 years ago. His smoking use included cigarettes. He has never used smokeless tobacco. He reports that he does not drink alcohol and does not use drugs. ? ? ?Physical Exam: ?BP 130/74   Pulse 84  Ht 6' (1.829 m)   Wt 125 lb (56.7 kg)   BMI 16.95 kg/m?   ?Constitutional:  Alert and oriented, No acute distress. ?HEENT: [place] AT, moist mucus membranes.  Trachea midline, no masses. ?Cardiovascular: No clubbing, cyanosis, or edema. ?Respiratory: Normal respiratory effort, no increased work of breathing. ? ? ?Assessment & Plan:   ? ?1.  BPH with incomplete bladder emptying ?Residual < 300 mL and he has no complaints ?He has requested to have his SP tube removed ?Continue finasteride ?Will add silodosin 4 mg daily ?PA follow-up 3 months with PVR ?Call earlier for voiding difficulty ? ?Abbie Sons, MD ? ?[facility] ?[address] ?[place],  [address] ?[phone] ?

## 2021-07-22 ENCOUNTER — Ambulatory Visit: Payer: Medicare Other

## 2021-10-15 ENCOUNTER — Ambulatory Visit: Payer: Medicare Other | Admitting: Urology

## 2021-11-14 ENCOUNTER — Ambulatory Visit (INDEPENDENT_AMBULATORY_CARE_PROVIDER_SITE_OTHER): Payer: Medicare Other | Admitting: Physician Assistant

## 2021-11-14 ENCOUNTER — Encounter: Payer: Self-pay | Admitting: Physician Assistant

## 2021-11-14 VITALS — BP 193/80 | HR 56 | Ht 72.0 in | Wt 128.0 lb

## 2021-11-14 DIAGNOSIS — N401 Enlarged prostate with lower urinary tract symptoms: Secondary | ICD-10-CM | POA: Diagnosis not present

## 2021-11-14 DIAGNOSIS — R339 Retention of urine, unspecified: Secondary | ICD-10-CM | POA: Diagnosis not present

## 2021-11-14 LAB — MICROSCOPIC EXAMINATION: WBC, UA: >30 /hpf — AB (ref 0–5)

## 2021-11-14 LAB — URINALYSIS, COMPLETE
Bilirubin, UA: NEGATIVE
Glucose, UA: NEGATIVE
Ketones, UA: NEGATIVE
Nitrite, UA: NEGATIVE
Specific Gravity, UA: 1.01 (ref 1.005–1.030)
Urobilinogen, Ur: 0.2 mg/dL (ref 0.2–1.0)
pH, UA: 6 (ref 5.0–7.5)

## 2021-11-14 LAB — BLADDER SCAN AMB NON-IMAGING: Scan Result: 80

## 2021-11-14 NOTE — Progress Notes (Signed)
11/14/2021 5:04 PM   Jacob Moses 06-10-1927 [id number]  CC: Chief Complaint  Patient presents with   Abdominal Pain   HPI: Jacob Moses is a 88 y.o. male with PMH BPH with recurrent urinary retention on silodosin and finasteride previously managed by SPT but who subsequently passed a voiding trial in March 2023 allowing for SPT removal who presents today for evaluation of lower abdominal pain.  He is accompanied today by his daughter, who contributes to HPI  Today he reports he is worried that a piece of his suprapubic catheter may have been left behind when the catheter was removed earlier this year.  He sometimes has shooting pain at his SPT site, typically in the middle of the night.  In-office UA today positive for 2+ blood, 2+ protein, and 3+ leukocyte esterase; urine microscopy with >30 WBCs/HPF, 3-10 RBCs/HPF, and few bacteria. PVR 39m.  PMH: Past Medical History:  Diagnosis Date   H/O vertigo    History of stomach ulcers    HOH (hard of hearing)    Hypertension    Insomnia    Wears dentures     Surgical History: Past Surgical History:  Procedure Laterality Date   APPENDECTOMY  1999   [place] 2010   CATARACT EXTRACTION W/PHACO Left 09/13/2019   Procedure: CATARACT EXTRACTION PHACO AND INTRAOCULAR LENS PLACEMENT (I[facility]) LEFT  5.06  00:48.8;  Surgeon: PBirder Robson MD;  Location: [facility]  Service: Ophthalmology;  Laterality: Left;   CATARACT EXTRACTION W/PHACO Right 10/04/2019   Procedure: CATARACT EXTRACTION PHACO AND INTRAOCULAR LENS PLACEMENT ([place] RIGHT;  Surgeon: PBirder Robson MD;  Location: [facility]  Service: Ophthalmology;  Laterality: Right;  4.64 0:43.9   ECTROPION REPAIR Bilateral 04/24/2015   Procedure: REPAIR OF ECTROPION TARSAL WEDGE LEFT EYE REPAIR EXTENSIVE BILATERAL;  Surgeon: AKarle Starch MD;  [place]  Service: Ophthalmology;  Laterality: Bilateral;   HERNIA  REPAIR  1999   Dr.Byrnett   HIP ARTHROPLASTY Right 11/29/2020   Procedure: ARTHROPLASTY BIPOLAR HIP (HEMIARTHROPLASTY)-Extra Scrub;  Surgeon: PLeim Fabry MD;  Location: [facility];  Service: Orthopedics;  Laterality: Right;   IR CATHETER TUBE CHANGE  03/12/2021   LACRIMAL DUCT EXPLORATION Bilateral 04/24/2015   Procedure: LACRIMAL DUCT EXPLORATION PUNCTOPLASTY;  Surgeon: AKarle Starch MD;  [place]  Service: Ophthalmology;  Laterality: Bilateral;   SEPTOPLASTY     STOMACH SURGERY  1966   ulcer    Home Medications:  Allergies as of 11/14/2021   No Known Allergies      Medication List        Accurate as of November 14, 2021  5:04 PM. If you have any questions, ask your nurse or doctor.          ALPRAZolam 0.5 MG 24 hr tablet Commonly known as: XANAX XR Take 1 tablet (0.5 mg total) by mouth daily.   finasteride 5 MG tablet Commonly known as: PROSCAR Take 1 tablet (5 mg total) by mouth daily.   multivitamin with minerals tablet Take 1 tablet by mouth daily.   silodosin 8 MG Caps capsule Commonly known as: RAPAFLO Take 1 capsule (8 mg total) by mouth daily with breakfast.        Allergies:  No Known Allergies  Family History: Family History  Problem Relation Age of Onset   Cancer Mother    Diabetes Father     Social History:   reports that he quit smoking  about 43 years ago. His smoking use included cigarettes. He has never used smokeless tobacco. He reports that he does not drink alcohol and does not use drugs.  Physical Exam: BP (!) 193/80   Pulse (!) 56   Ht 6' (1.829 m)   Wt 128 lb (58.1 kg)   BMI 17.36 kg/m   Constitutional:  Alert and oriented, no acute distress, nontoxic appearing HEENT: [place], AT Cardiovascular: No clubbing, cyanosis, or edema Respiratory: Normal respiratory effort, no increased work of breathing GU: Well-healed site of prior SP tract.  Some skin tethering consistent with scar tissue along the tract. Skin: No  rashes, bruises or suspicious lesions Neurologic: Grossly intact, no focal deficits, moving all 4 extremities Psychiatric: Normal mood and affect  Laboratory Data: Results for orders placed or performed in visit on 11/14/21  Microscopic Examination   Urine  Result Value Ref Range   WBC, UA >30 (A) 0 - 5 /hpf   RBC, Urine 3-10 (A) 0 - 2 /hpf   Epithelial Cells (non renal) 0-10 0 - 10 /hpf   Bacteria, UA Few None seen/Few  Urinalysis, Complete  Result Value Ref Range   Specific Gravity, UA 1.010 1.005 - 1.030   pH, UA 6.0 5.0 - 7.5   Color, UA Yellow Yellow   Appearance Ur Cloudy (A) Clear   Leukocytes,UA 3+ (A) Negative   Protein,UA 2+ (A) Negative/Trace   Glucose, UA Negative Negative   Ketones, UA Negative Negative   RBC, UA 2+ (A) Negative   Bilirubin, UA Negative Negative   Urobilinogen, Ur 0.2 0.2 - 1.0 mg/dL   Nitrite, UA Negative Negative   Microscopic Examination See below:   Bladder Scan (Post Void Residual) in office  Result Value Ref Range   Scan Result 80    Assessment & Plan:   1. Benign prostatic hyperplasia with incomplete bladder emptying I reassured the patient today that he is emptying well and I do not think that there is a retained piece of catheter in his bladder.  He was reassured by this.  We discussed that his shooting pain at the prior SPT site could be due to scar tissue, which is expected to soft and over time.  His UA today is notable for pyuria and microscopic hematuria.  Will send for culture for further evaluation and treat based on symptoms when results become available. - Urinalysis, Complete - Bladder Scan (Post Void Residual) in office - CULTURE, URINE COMPREHENSIVE  Return for Will call with results.  Debroah Loop, PA-C  [facility] [address], [place], [place] [address] [phone]

## 2021-11-19 LAB — CULTURE, URINE COMPREHENSIVE

## 2021-11-20 ENCOUNTER — Other Ambulatory Visit: Payer: Self-pay | Admitting: *Deleted

## 2021-11-20 MED ORDER — CIPROFLOXACIN HCL 250 MG PO TABS: 250.0000 mg | ORAL_TABLET | Freq: Two times a day (BID) | ORAL | 0 refills | Status: AC

## 2022-04-01 ENCOUNTER — Ambulatory Visit: Payer: Medicare Other | Admitting: Dermatology

## 2022-11-20 ENCOUNTER — Observation Stay: Payer: Medicare Other

## 2022-11-20 ENCOUNTER — Inpatient Hospital Stay
Admission: EM | Admit: 2022-11-20 | Discharge: 2022-11-25 | DRG: 640 | Disposition: A | Discharge status: Skilled Nursing Facility | Payer: Medicare Other | Attending: Hospitalist | Admitting: Hospitalist

## 2022-11-20 ENCOUNTER — Other Ambulatory Visit: Payer: Self-pay

## 2022-11-20 ENCOUNTER — Emergency Department: Payer: Medicare Other

## 2022-11-20 ENCOUNTER — Encounter: Payer: Self-pay | Admitting: Emergency Medicine

## 2022-11-20 DIAGNOSIS — Z8711 Personal history of peptic ulcer disease: Secondary | ICD-10-CM

## 2022-11-20 DIAGNOSIS — Z87891 Personal history of nicotine dependence: Secondary | ICD-10-CM

## 2022-11-20 DIAGNOSIS — G4700 Insomnia, unspecified: Secondary | ICD-10-CM | POA: Diagnosis present

## 2022-11-20 DIAGNOSIS — Z9841 Cataract extraction status, right eye: Secondary | ICD-10-CM

## 2022-11-20 DIAGNOSIS — N401 Enlarged prostate with lower urinary tract symptoms: Secondary | ICD-10-CM | POA: Diagnosis present

## 2022-11-20 DIAGNOSIS — H9190 Unspecified hearing loss, unspecified ear: Secondary | ICD-10-CM | POA: Diagnosis present

## 2022-11-20 DIAGNOSIS — Z9181 History of falling: Secondary | ICD-10-CM

## 2022-11-20 DIAGNOSIS — R001 Bradycardia, unspecified: Secondary | ICD-10-CM | POA: Diagnosis not present

## 2022-11-20 DIAGNOSIS — S065XAA Traumatic subdural hemorrhage with loss of consciousness status unknown, initial encounter: Secondary | ICD-10-CM | POA: Diagnosis present

## 2022-11-20 DIAGNOSIS — M6282 Rhabdomyolysis: Secondary | ICD-10-CM

## 2022-11-20 DIAGNOSIS — I10 Essential (primary) hypertension: Secondary | ICD-10-CM | POA: Diagnosis not present

## 2022-11-20 DIAGNOSIS — W1830XA Fall on same level, unspecified, initial encounter: Secondary | ICD-10-CM | POA: Diagnosis present

## 2022-11-20 DIAGNOSIS — I444 Left anterior fascicular block: Secondary | ICD-10-CM | POA: Diagnosis present

## 2022-11-20 DIAGNOSIS — R7989 Other specified abnormal findings of blood chemistry: Secondary | ICD-10-CM | POA: Diagnosis present

## 2022-11-20 DIAGNOSIS — K219 Gastro-esophageal reflux disease without esophagitis: Secondary | ICD-10-CM | POA: Diagnosis present

## 2022-11-20 DIAGNOSIS — Z833 Family history of diabetes mellitus: Secondary | ICD-10-CM

## 2022-11-20 DIAGNOSIS — Z96641 Presence of right artificial hip joint: Secondary | ICD-10-CM | POA: Diagnosis present

## 2022-11-20 DIAGNOSIS — N1831 Chronic kidney disease, stage 3a: Secondary | ICD-10-CM | POA: Diagnosis present

## 2022-11-20 DIAGNOSIS — Y92009 Unspecified place in unspecified non-institutional (private) residence as the place of occurrence of the external cause: Secondary | ICD-10-CM

## 2022-11-20 DIAGNOSIS — Z91199 Patient's noncompliance with other medical treatment and regimen due to unspecified reason: Secondary | ICD-10-CM

## 2022-11-20 DIAGNOSIS — Z681 Body mass index (BMI) 19 or less, adult: Secondary | ICD-10-CM

## 2022-11-20 DIAGNOSIS — W19XXXA Unspecified fall, initial encounter: Secondary | ICD-10-CM

## 2022-11-20 DIAGNOSIS — I440 Atrioventricular block, first degree: Secondary | ICD-10-CM | POA: Diagnosis present

## 2022-11-20 DIAGNOSIS — R64 Cachexia: Secondary | ICD-10-CM | POA: Diagnosis present

## 2022-11-20 DIAGNOSIS — R55 Syncope and collapse: Secondary | ICD-10-CM | POA: Diagnosis not present

## 2022-11-20 DIAGNOSIS — Z961 Presence of intraocular lens: Secondary | ICD-10-CM | POA: Diagnosis present

## 2022-11-20 DIAGNOSIS — R441 Visual hallucinations: Secondary | ICD-10-CM | POA: Diagnosis present

## 2022-11-20 DIAGNOSIS — S81801A Unspecified open wound, right lower leg, initial encounter: Secondary | ICD-10-CM | POA: Diagnosis present

## 2022-11-20 DIAGNOSIS — F419 Anxiety disorder, unspecified: Secondary | ICD-10-CM | POA: Diagnosis present

## 2022-11-20 DIAGNOSIS — I4891 Unspecified atrial fibrillation: Secondary | ICD-10-CM | POA: Diagnosis present

## 2022-11-20 DIAGNOSIS — I495 Sick sinus syndrome: Secondary | ICD-10-CM | POA: Diagnosis not present

## 2022-11-20 DIAGNOSIS — N183 Chronic kidney disease, stage 3 unspecified: Secondary | ICD-10-CM | POA: Diagnosis present

## 2022-11-20 DIAGNOSIS — I7121 Aneurysm of the ascending aorta, without rupture: Secondary | ICD-10-CM | POA: Diagnosis present

## 2022-11-20 DIAGNOSIS — T796XXA Traumatic ischemia of muscle, initial encounter: Secondary | ICD-10-CM | POA: Diagnosis present

## 2022-11-20 DIAGNOSIS — R918 Other nonspecific abnormal finding of lung field: Secondary | ICD-10-CM

## 2022-11-20 DIAGNOSIS — Z66 Do not resuscitate: Secondary | ICD-10-CM | POA: Diagnosis present

## 2022-11-20 DIAGNOSIS — E876 Hypokalemia: Secondary | ICD-10-CM | POA: Diagnosis present

## 2022-11-20 DIAGNOSIS — Z79899 Other long term (current) drug therapy: Secondary | ICD-10-CM

## 2022-11-20 DIAGNOSIS — R748 Abnormal levels of other serum enzymes: Secondary | ICD-10-CM | POA: Diagnosis present

## 2022-11-20 DIAGNOSIS — E875 Hyperkalemia: Principal | ICD-10-CM | POA: Diagnosis present

## 2022-11-20 DIAGNOSIS — R3914 Feeling of incomplete bladder emptying: Secondary | ICD-10-CM

## 2022-11-20 DIAGNOSIS — I472 Ventricular tachycardia, unspecified: Secondary | ICD-10-CM | POA: Diagnosis present

## 2022-11-20 DIAGNOSIS — E43 Unspecified severe protein-calorie malnutrition: Secondary | ICD-10-CM | POA: Diagnosis present

## 2022-11-20 DIAGNOSIS — Z87820 Personal history of traumatic brain injury: Secondary | ICD-10-CM

## 2022-11-20 DIAGNOSIS — E860 Dehydration: Secondary | ICD-10-CM | POA: Diagnosis present

## 2022-11-20 DIAGNOSIS — I129 Hypertensive chronic kidney disease with stage 1 through stage 4 chronic kidney disease, or unspecified chronic kidney disease: Secondary | ICD-10-CM | POA: Diagnosis present

## 2022-11-20 DIAGNOSIS — F05 Delirium due to known physiological condition: Secondary | ICD-10-CM | POA: Diagnosis present

## 2022-11-20 DIAGNOSIS — Z9842 Cataract extraction status, left eye: Secondary | ICD-10-CM

## 2022-11-20 DIAGNOSIS — N1832 Chronic kidney disease, stage 3b: Secondary | ICD-10-CM

## 2022-11-20 LAB — COMPREHENSIVE METABOLIC PANEL
ALT: 17 U/L (ref 0–44)
AST: 30 U/L (ref 15–41)
Albumin: 4.3 g/dL (ref 3.5–5.0)
Alkaline Phosphatase: 77 U/L (ref 38–126)
Anion gap: 6 (ref 5–15)
BUN: 36 mg/dL — ABNORMAL HIGH (ref 8–23)
CO2: 26 mmol/L (ref 22–32)
Calcium: 8.9 mg/dL (ref 8.9–10.3)
Chloride: 102 mmol/L (ref 98–111)
Creatinine, Ser: 1.67 mg/dL — ABNORMAL HIGH (ref 0.61–1.24)
GFR, Estimated: 38 mL/min — ABNORMAL LOW (ref >60)
Glucose, Bld: 110 mg/dL — ABNORMAL HIGH (ref 70–99)
Potassium: 6.3 mmol/L (ref 3.5–5.1)
Sodium: 134 mmol/L — ABNORMAL LOW (ref 135–145)
Total Bilirubin: 0.6 mg/dL (ref 0.3–1.2)
Total Protein: 7.6 g/dL (ref 6.5–8.1)

## 2022-11-20 LAB — CBC WITH DIFFERENTIAL/PLATELET
Abs Immature Granulocytes: 0.04 10*3/uL (ref 0.00–0.07)
Basophils Absolute: 0 10*3/uL (ref 0.0–0.1)
Basophils Relative: 0 %
Eosinophils Absolute: 0.1 10*3/uL (ref 0.0–0.5)
Eosinophils Relative: 1 %
HCT: 39.3 % (ref 39.0–52.0)
Hemoglobin: 12.7 g/dL — ABNORMAL LOW (ref 13.0–17.0)
Immature Granulocytes: 0 %
Lymphocytes Relative: 15 %
Lymphs Abs: 1.4 10*3/uL (ref 0.7–4.0)
MCH: 31.4 pg (ref 26.0–34.0)
MCHC: 32.3 g/dL (ref 30.0–36.0)
MCV: 97.3 fL (ref 80.0–100.0)
Monocytes Absolute: 1 10*3/uL (ref 0.1–1.0)
Monocytes Relative: 11 %
Neutro Abs: 7 10*3/uL (ref 1.7–7.7)
Neutrophils Relative %: 73 %
Platelets: 254 10*3/uL (ref 150–400)
RBC: 4.04 MIL/uL — ABNORMAL LOW (ref 4.22–5.81)
RDW: 12.3 % (ref 11.5–15.5)
WBC: 9.5 10*3/uL (ref 4.0–10.5)
nRBC: 0 % (ref 0.0–0.2)

## 2022-11-20 LAB — CK
Total CK: 664 U/L — ABNORMAL HIGH (ref 49–397)
Total CK: 587 U/L — ABNORMAL HIGH (ref 49–397)

## 2022-11-20 LAB — TROPONIN I (HIGH SENSITIVITY)
Troponin I (High Sensitivity): 39 ng/L — ABNORMAL HIGH (ref <18)
Troponin I (High Sensitivity): 44 ng/L — ABNORMAL HIGH (ref <18)
Troponin I (High Sensitivity): 35 ng/L — ABNORMAL HIGH (ref <18)

## 2022-11-20 LAB — BASIC METABOLIC PANEL
Anion gap: 4 — ABNORMAL LOW (ref 5–15)
BUN: 33 mg/dL — ABNORMAL HIGH (ref 8–23)
CO2: 25 mmol/L (ref 22–32)
Calcium: 8.5 mg/dL — ABNORMAL LOW (ref 8.9–10.3)
Chloride: 105 mmol/L (ref 98–111)
Creatinine, Ser: 1.68 mg/dL — ABNORMAL HIGH (ref 0.61–1.24)
GFR, Estimated: 38 mL/min — ABNORMAL LOW (ref >60)
Glucose, Bld: 100 mg/dL — ABNORMAL HIGH (ref 70–99)
Potassium: 5.8 mmol/L — ABNORMAL HIGH (ref 3.5–5.1)
Sodium: 134 mmol/L — ABNORMAL LOW (ref 135–145)

## 2022-11-20 LAB — CBG MONITORING, ED
Glucose-Capillary: 118 mg/dL — ABNORMAL HIGH (ref 70–99)
Glucose-Capillary: 98 mg/dL (ref 70–99)
Glucose-Capillary: 98 mg/dL (ref 70–99)

## 2022-11-20 LAB — POTASSIUM: Potassium: 6.5 mmol/L (ref 3.5–5.1)

## 2022-11-20 MED ORDER — ENOXAPARIN SODIUM 30 MG/0.3ML IJ SOSY
30.0000 mg | PREFILLED_SYRINGE | INTRAMUSCULAR | Status: DC
  Administered 2022-11-20 – 2022-11-24 (×5): 30 mg via SUBCUTANEOUS
  Filled 2022-11-20 (×5): qty 0.3

## 2022-11-20 MED ORDER — ACETAMINOPHEN 650 MG RE SUPP: 650.0000 mg | Freq: Four times a day (QID) | RECTAL | Status: DC | PRN

## 2022-11-20 MED ORDER — ALPRAZOLAM 0.25 MG PO TABS
0.2500 mg | ORAL_TABLET | Freq: Every evening | ORAL | Status: DC | PRN
  Administered 2022-11-20 – 2022-11-23 (×4): 0.25 mg via ORAL
  Filled 2022-11-20 (×4): qty 1

## 2022-11-20 MED ORDER — ENOXAPARIN SODIUM 30 MG/0.3ML IJ SOSY: 30.0000 mg | PREFILLED_SYRINGE | INTRAMUSCULAR | Status: DC

## 2022-11-20 MED ORDER — CALCIUM GLUCONATE-NACL 1-0.675 GM/50ML-% IV SOLN
1.0000 g | Freq: Once | INTRAVENOUS | Status: AC
  Administered 2022-11-20: 1000 mg via INTRAVENOUS
  Filled 2022-11-20: qty 50

## 2022-11-20 MED ORDER — SODIUM CHLORIDE 0.9% FLUSH
3.0000 mL | Freq: Two times a day (BID) | INTRAVENOUS | Status: DC
  Administered 2022-11-20 – 2022-11-25 (×9): 3 mL via INTRAVENOUS

## 2022-11-20 MED ORDER — DEXTROSE 50 % IV SOLN: 1.0000 | INTRAVENOUS | Status: DC | PRN

## 2022-11-20 MED ORDER — ACETAMINOPHEN 500 MG PO TABS
500.0000 mg | ORAL_TABLET | Freq: Every day | ORAL | Status: DC
  Administered 2022-11-20: 500 mg via ORAL
  Filled 2022-11-20: qty 1

## 2022-11-20 MED ORDER — SODIUM CHLORIDE 0.9 % IV BOLUS
250.0000 mL | Freq: Once | INTRAVENOUS | Status: AC
  Administered 2022-11-20: 250 mL via INTRAVENOUS

## 2022-11-20 MED ORDER — ONDANSETRON HCL 4 MG PO TABS: 4.0000 mg | ORAL_TABLET | Freq: Four times a day (QID) | ORAL | Status: DC | PRN

## 2022-11-20 MED ORDER — PATIROMER SORBITEX CALCIUM 8.4 G PO PACK
16.8000 g | PACK | Freq: Every day | ORAL | Status: DC
  Filled 2022-11-20: qty 2

## 2022-11-20 MED ORDER — POLYETHYLENE GLYCOL 3350 17 G PO PACK: 17.0000 g | PACK | Freq: Every day | ORAL | Status: DC | PRN

## 2022-11-20 MED ORDER — ONDANSETRON HCL 4 MG/2ML IJ SOLN
4.0000 mg | Freq: Four times a day (QID) | INTRAMUSCULAR | Status: DC | PRN
  Administered 2022-11-22: 4 mg via INTRAVENOUS
  Filled 2022-11-20: qty 2

## 2022-11-20 MED ORDER — SODIUM ZIRCONIUM CYCLOSILICATE 10 G PO PACK
10.0000 g | PACK | Freq: Once | ORAL | Status: AC
  Administered 2022-11-20: 10 g via ORAL
  Filled 2022-11-20: qty 1

## 2022-11-20 MED ORDER — SODIUM CHLORIDE 0.9 % IV BOLUS
1000.0000 mL | Freq: Once | INTRAVENOUS | Status: AC
  Administered 2022-11-20: 1000 mL via INTRAVENOUS

## 2022-11-20 MED ORDER — ACETAMINOPHEN 325 MG PO TABS
650.0000 mg | ORAL_TABLET | Freq: Four times a day (QID) | ORAL | Status: DC | PRN
  Administered 2022-11-21 – 2022-11-22 (×2): 650 mg via ORAL
  Filled 2022-11-20 (×2): qty 2

## 2022-11-20 MED ORDER — INSULIN ASPART 100 UNIT/ML IJ SOLN
6.0000 [IU] | Freq: Once | INTRAMUSCULAR | Status: AC
  Administered 2022-11-20: 6 [IU] via INTRAVENOUS
  Filled 2022-11-20: qty 1

## 2022-11-20 MED ORDER — DEXTROSE 50 % IV SOLN
25.0000 g | Freq: Once | INTRAVENOUS | Status: AC
  Administered 2022-11-20: 25 g via INTRAVENOUS
  Filled 2022-11-20: qty 50

## 2022-11-20 NOTE — Assessment & Plan Note (Signed)
Patient is on Xanax 0.5 mg daily at home for insomnia and anxiety.  I am concerned this may have contributed to his ground-level fall.  Can restart but at a lower dose  - Xanax 0.25 mg nightly

## 2022-11-20 NOTE — ED Notes (Signed)
Pt up to bedside commode and had a BM

## 2022-11-20 NOTE — Assessment & Plan Note (Signed)
History of sinus bradycardia, with current rates in the 50s.  - Telemetry monitoring

## 2022-11-20 NOTE — ED Notes (Signed)
Marchelle Folks, RN called this RN to inform that their director said they can't take this patient due to order for Q1 hour BG's. Consulting civil engineer notified

## 2022-11-20 NOTE — Progress Notes (Signed)
PT Cancellation Note  Patient Details Name: Jacob Moses MRN: [id number] DOB: 1928-04-10   Cancelled Treatment:    Reason Eval/Treat Not Completed: Other (comment) Consult received and chart reviewed. Patient noted with critically elevated K+ , per PT practice guidelines contraindicated for exertional activity at this time.  Will continue efforts next date pending medical stability and appropriateness.   Olga Coaster PT, DPT 1:42 PM,11/20/22

## 2022-11-20 NOTE — Assessment & Plan Note (Addendum)
Patient is presenting after syncopal yesterday evening after which he laid on the ground due to inability to get up independently.  Mild rhabdomyolysis noted, however no acute fractures patient was able to ambulate with his walker afterwards.   - Telemetry monitoring given bradycardia - Management of hyperkalemia as noted below - Given history of moderate AI in 2021, repeat echocardiogram.  Patient not a candidate for any advanced therapies, however may help with goals of care discussions - PT/OT

## 2022-11-20 NOTE — Assessment & Plan Note (Signed)
Patient has a history of CKD, initially stage IIIa, however currently seems stage IIIb.  He has previously refused follow-up with nephrology.  Will continue to monitor while admitted  - Repeat BMP in the a.m.

## 2022-11-20 NOTE — ED Provider Notes (Signed)
[facility] Provider Note    Event Date/Time   First MD Initiated Contact with Patient 11/20/22 [id number]     (approximate)   History   Fall   HPI  Jacob Moses is a 89 y.o. male presents to the ED via EMS from [facility] which is a apartment in the [place] assisted living facility.  Patient lives alone.  He reports that he fell last evening and spent the night on the floor.  EMS reports that they did find him on the floor after nursing staff checked on him this morning.  Patient denies any head injury or loss of consciousness but states "my lights went out".  EMS reports that his walker was not near him where they found him.  Patient was given his walker has been ambulatory with it prior to arrival in the ED.  He denies any pain.  This was an unwitnessed fall.  He denies any headache, nausea, vomiting, visual changes, chest pain, shortness of breath.  Patient has a history of hypertension but discontinued taking his blood pressure medication several months ago.  He has a history of a subdural from a fall in 2021, hypertension, GERD, anxiety,chronic kidney disease stage III, anxiety, bradycardia.     Physical Exam   Triage Vital Signs: ED Triage Vitals  Encounter Vitals Group     BP 11/20/22 0724 (!) 175/97     Systolic BP Percentile --      Diastolic BP Percentile --      Pulse Rate 11/20/22 0724 (!) 52     Resp 11/20/22 0724 16     Temp 11/20/22 0724 97.7 F (36.5 C)     Temp Source 11/20/22 0724 Oral     SpO2 11/20/22 0724 100 %     Weight 11/20/22 0730 128 lb 1.4 oz (58.1 kg)     Height 11/20/22 0730 6' (1.829 m)     Head Circumference --      Peak Flow --      Pain Score 11/20/22 0730 0     Pain Loc --      Pain Education --      Exclude from Growth Chart --     Most recent vital signs: Vitals:   11/20/22 1148 11/20/22 1300  BP:  (!) 165/89  Pulse:  (!) 50  Resp:  14  Temp: 97.8 F (36.6 C)   SpO2:  99%     General: Awake, no  distress.  Patient is very hard of hearing.  Alert, talkative. CV:  Good peripheral perfusion.  Heart regular rate and rhythm. Resp:  Normal effort.  Lungs clear bilaterally.  Nontender ribs to palpation. Abd:  No distention.  Soft, nontender, bowel sounds present x 4 quadrants. Other:  Patient able to move upper and lower extremities without any difficulty.  Cranial nerves II through XII grossly intact.  No tenderness on palpation of cervical spine posteriorly.  Nontender thoracic or lumbar spine, no tenderness or pain with compression of the pelvis.  No pitting edema noted lower extremities and patient is able to move lower extremities without any difficulty.  Diffuse superficial skin abrasions secondary to fall.   ED Results / Procedures / Treatments   Labs (all labs ordered are listed, but only abnormal results are displayed) Labs Reviewed  CBC WITH DIFFERENTIAL/PLATELET - Abnormal; Notable for the following components:      Result Value   RBC 4.04 (*)    Hemoglobin 12.7 (*)  All other components within normal limits  COMPREHENSIVE METABOLIC PANEL - Abnormal; Notable for the following components:   Sodium 134 (*)    Potassium 6.3 (*)    Glucose, Bld 110 (*)    BUN 36 (*)    Creatinine, Ser 1.67 (*)    GFR, Estimated 38 (*)    All other components within normal limits  CK - Abnormal; Notable for the following components:   Total CK 664 (*)    All other components within normal limits  POTASSIUM - Abnormal; Notable for the following components:   Potassium 6.5 (*)    All other components within normal limits  CK - Abnormal; Notable for the following components:   Total CK 587 (*)    All other components within normal limits  TROPONIN I (HIGH SENSITIVITY) - Abnormal; Notable for the following components:   Troponin I (High Sensitivity) 39 (*)    All other components within normal limits  TROPONIN I (HIGH SENSITIVITY) - Abnormal; Notable for the following components:    Troponin I (High Sensitivity) 44 (*)    All other components within normal limits  BASIC METABOLIC PANEL  CBG MONITORING, ED  CBG MONITORING, ED  CBG MONITORING, ED  CBG MONITORING, ED  CBG MONITORING, ED  CBG MONITORING, ED  CBG MONITORING, ED  CBG MONITORING, ED  CBG MONITORING, ED  CBG MONITORING, ED     EKG  Vent. rate 51 BPM PR interval 228 ms QRS duration 116 ms QT/QTcB 466/429 ms P-R-T axes 83 -81 25 Sinus bradycardia with 1st degree A-V block Left anterior fascicular block Left ventricular hypertrophy with QRS widening ( R in aVL , Cornell product ) Anteroseptal infarct , age undetermined Abnormal ECG  RADIOLOGY CT cervical spine per radiologist negative for acute fracture or malalignment.  CT head per radiologist shows a subacute subdural hematoma measuring 5 mm left parietal area.  This appears to be the same area that was seen initially in 2021. Chest x-ray one-view portable per radiologist is slightly hazy right perihilar region that could represent atelectasis or infection.   PROCEDURES:  Critical Care performed:   Procedures   MEDICATIONS ORDERED IN ED: Medications  sodium chloride flush (NS) 0.9 % injection 3 mL (has no administration in time range)  enoxaparin (LOVENOX) injection 40 mg (has no administration in time range)  acetaminophen (TYLENOL) tablet 650 mg (has no administration in time range)    Or  acetaminophen (TYLENOL) suppository 650 mg (has no administration in time range)  polyethylene glycol (MIRALAX / GLYCOLAX) packet 17 g (has no administration in time range)  ondansetron (ZOFRAN) tablet 4 mg (has no administration in time range)    Or  ondansetron (ZOFRAN) injection 4 mg (has no administration in time range)  sodium chloride 0.9 % bolus 250 mL (0 mLs Intravenous Stopped 11/20/22 1205)  sodium chloride 0.9 % bolus 1,000 mL (1,000 mLs Intravenous New Bag/Given 11/20/22 1204)  insulin aspart (novoLOG) injection 6 Units (6 Units  Intravenous Given 11/20/22 1300)  dextrose 50 % solution 25 g (25 g Intravenous Given 11/20/22 1302)  sodium zirconium cyclosilicate (LOKELMA) packet 10 g (10 g Oral Given 11/20/22 1329)     IMPRESSION / MDM / ASSESSMENT AND PLAN / ED COURSE  I reviewed the triage vital signs and the nursing notes.   Differential diagnosis includes, but is not limited to, syncopal episode, myocardial infarction, acute head injury, rhabdomyolysis, hypoglycemia, hypokalemia, chronic kidney disease, hypertension uncontrolled, patient noncompliant.  89 year old male presents to  the ED via EMS after being on the floor from during the night.  Patient lives alone and this was unwitnessed.  Patient lives in an apartment in a nursing facility.  Staff found him on the floor talkative and alert.  Patient reports that he discontinued taking his blood pressure medication several months ago.  Daughter is present and states that he has seen cardiology at [facility] but has refused to continue with scheduled visits.  He also refused to see nephrology about his renal functions.  He continues to deny any shortness of breath, chest pain, nausea, vomiting.  Fluids were given while in the ED and CK rechecked.  Troponin with mild elevation from 39-44 on the second test.  Hyperkalemia with a potassium of 6.5.  CT scan shows a subacute subdural that measures 5 mm and previous CT shows initial injury measuring 2.4 I spoke with patient and daughter and patient is agreeable to be hospitalized.  Arrangements are being made for admission.  I spoke with Dr. Huel Cote, who is the hospitalist and will see the patient for admission.        Patient's presentation is most consistent with acute presentation with potential threat to life or bodily function.  FINAL CLINICAL IMPRESSION(S) / ED DIAGNOSES   Final diagnoses:  Traumatic rhabdomyolysis, initial encounter (HCC)  Elevated troponin  Hypertension, uncontrolled  Medically noncompliant   Fall in home, initial encounter     Rx / DC Orders   ED Discharge Orders     None        Note:  This document was prepared using Dragon voice recognition software and may include unintentional dictation errors.   Tommi Rumps, PA-C 11/20/22 1341    Sharman Cheek, MD 11/20/22 [phone]

## 2022-11-20 NOTE — Assessment & Plan Note (Signed)
Chest x-ray noted to have a hazy opacity.  Lung examination is reassuring and patient declines any shortness of breath.  Potentially aspiration pneumonitis given syncopal episode, not quite warranting antimicrobials at this point.  Will check a CT of the chest for further characterization.  - CT of the chest without contrast

## 2022-11-20 NOTE — ED Notes (Signed)
Pt gone to CT 

## 2022-11-20 NOTE — Assessment & Plan Note (Addendum)
Patient has a history of hyperkalemia in the past.  Uncertain etiology at this time as patient's renal dysfunction is not so severe.  Rhabdomyolysis, however this is also mild.  He is not on any medications that can cause this.  EKG notable for borderline PR prolongation and peaked T waves.  - Telemetry monitoring - S/p IV fluids - Insulin and Lokelma ordered - Recheck potassium this evening

## 2022-11-20 NOTE — Progress Notes (Signed)
       CROSS COVER NOTE  NAME: Jacob Moses MRN: [id number] DOB : [birth year]    Concern as stated by nurse / staff   Per Dr Huel Cote: just wanted to let you know about this guy. He came in with a syncopal event, and his EKG showed sinus bradycardia. This afternoon, he went into sinus tach as high as 140. Then it was irregular at times, with either PACs or A-fib. He also had a lot of PVCs during this with several 5-6 beat NSVT. I talked to cardiology, they are recommending low-dose metoprolol, but now he is back to his sinus bradycardia in the 50s so I held off. He is hyperkalemic so he is getting treatment for that too with a repeat BMP at 2300.      Pertinent findings on chart review:    11/20/2022   11:56 PM 11/20/2022    8:50 PM 11/20/2022    6:49 PM  Vitals with BMI  Systolic 155 161 782  Diastolic 79 77 81  Pulse 53 55 59      Latest Ref Rng & Units 11/20/2022    4:45 PM 11/20/2022   10:37 AM 11/20/2022    7:32 AM  BMP  Glucose 70 - 99 mg/dL 956   213   BUN 8 - 23 mg/dL 33   36   Creatinine 0.86 - 1.24 mg/dL 5.78   4.69   Sodium 629 - 145 mmol/L 134   134   Potassium 3.5 - 5.1 mmol/L 5.8  6.5  6.3   Chloride 98 - 111 mmol/L 105   102   CO2 22 - 32 mmol/L 25   26   Calcium 8.9 - 10.3 mg/dL 8.5   8.9      Assessment and  Interventions   Assessment:  Sinus bradycardia-stable Hyperkalemia-repeat BMP still pending(likely due to unanticipated downtime)  Plan: Follow-up repeat BMP X X

## 2022-11-20 NOTE — Progress Notes (Addendum)
  Progress Note  Patient's nurse alerted to regarding tachycardia via CCMD.   Initial EKG on arrival notable for sinus bradycardia with first-degree AV block and T wave peaking.  J-point elevation in leads V2, V3 and V4.  Now, he has had a change.  Telemetry monitoring at approximately 1702, appears concerning for possible atrial flutter.  Around 1730, patient's heart rate increases up to 120, making it difficult to see P waves however rate is regular with intermittent PAC.  From 1734, patient's rate increased up to 140.  Between 1744 and 1759, patient has had numerous PVCs, and some nonsustained V. tach with maximum of 5 beats at a time.  Now at approximately 1809, patient's rates have improved down to the 70s, however with continued first-degree block and intermittent PACs.  Assessment/plan:  # Intermittent Sinus Tachycardia # Non-sustained V. Tach # Questionable Atrial Flutter BMP was repeated this afternoon with potassium improving down to 5.8.  Creatinine function is stable.  Given changes rate and rhythm, will give additional dose of Lokelma and one-time dose of calcium gluconate, as he did receive any calcium earlier.  EKG was obtained after rhythm changes resolved, demonstrating persistent sinus bradycardia with first-degree AV block and peaked T waves.  - Lokelma 10g once - Calcium gluconate 1 g - Repeat troponin now - Will consult cardiology; appreciate their recommendations.  Per discussion, will consider low-dose metoprolol for ectopy suppression, however if heart rate or blood pressure cannot support, may require amiodarone - Transferr to progressive unit for closer monitoring - Repeat BMP at 2300 - Echocardiogram already ordered  Author: Verdene Lennert, MD 11/20/2022 6:03 PM  For on call review [email or web address].

## 2022-11-20 NOTE — Assessment & Plan Note (Signed)
-   Continue home medications - Bladder scan to evaluate for retention

## 2022-11-20 NOTE — ED Provider Notes (Signed)
-----------------------------------------   12:10 PM on 11/20/2022 ----------------------------------------- I have seen and evaluated the patient in conjunction with physician assistant Bridget Hartshorn.  Patient was seen for a fall found down was down at least overnight.  Patient appears to be having from mild rhabdomyolysis with a CK of 587 slight troponin elevation although largely unchanged on recheck.  Patient CBC is reassuring chemistry however shows hyperkalemia, rechecked and shows similar findings.  I reviewed the patient's EKG:  EKG viewed and interpreted by myself shows sinus bradycardia 51 bpm with a narrow QRS, left axis deviation, prolonged PR interval, somewhat peaked T waves with nonspecific ST changes.  Given the patient's PR prolongation with peaked T waves we will dose insulin and glucose as well as Veltassa in addition to IV fluids.  Patient has received 250 cc of IV fluids I have added an additional liter of normal saline.  Patient CT scan of the head does show subacute subdural hematoma daughter states initial injury occurred earlier this year and it does appear to be decreasing in size.  No acute issues from this.  Patient will require admission to the hospitalist service for ongoing workup and treatment for his rhabdomyolysis and hyperkalemia.  Patient and daughter are agreeable to plan of care.  CRITICAL CARE Performed by: Minna Antis   Total critical care time: 30 minutes  Critical care time was exclusive of separately billable procedures and treating other patients.  Critical care was necessary to treat or prevent imminent or life-threatening deterioration.  Critical care was time spent personally by me on the following activities: development of treatment plan with patient and/or surrogate as well as nursing, discussions with consultants, evaluation of patient's response to treatment, examination of patient, obtaining history from patient or surrogate, ordering and  performing treatments and interventions, ordering and review of laboratory studies, ordering and review of radiographic studies, pulse oximetry and re-evaluation of patient's condition.    Minna Antis, MD 11/21/22 [phone]

## 2022-11-20 NOTE — ED Notes (Signed)
Admitted provider at bedside.

## 2022-11-20 NOTE — Assessment & Plan Note (Deleted)
Patient is presenting after ground-level fall yesterday evening after which she laid on the ground due to inability to get up independently.  Mild rhabdomyolysis noted, however no acute fractures patient was able to ambulate with his walker afterwards.  Etiology of fall is undetermined, but syncopal episode is possible.  - Telemetry monitoring - Management of hyperkalemia as noted below - Given history of moderate AI in 2021, repeat echocardiogram.  Patient not a candidate for any advanced therapies, however may help with goals of care discussions - PT/OT

## 2022-11-20 NOTE — ED Notes (Signed)
Pt to be transported to the floor at this time. Stable at time of departure

## 2022-11-20 NOTE — Assessment & Plan Note (Signed)
Patient was significantly hypertensive on presentation, likely due to self discontinuation of home antihypertensives.  Given advanced age and patient preference, can hold off on restarting at this time.

## 2022-11-20 NOTE — ED Triage Notes (Signed)
Presents via EMS from home  States he lives in assisted living apartment behind [facility]  States he fell last night around 8 pm  Was unable to get up   Ems was able to help him up and he did ambulate with walker  Denies any pain  Skin tear to right lower leg and back

## 2022-11-20 NOTE — H&P (Addendum)
History and Physical    Patient: Jacob Moses:[id number] DOB: 03-Mar-1928 DOA: 11/20/2022 DOS: the patient was seen and examined on 11/20/2022 PCP: Jaclyn Shaggy, MD  Patient coming from: Home  Chief Complaint:  Chief Complaint  Patient presents with   Fall   HPI: Jacob Moses is a 89 y.o. male with medical history significant of hypertension, BPH with urinary retention, previous fall with SDH, insomnia on Xanax, CKD stage IIIa who presents to the ED due to a syncope.  History obtained from both patient and his daughter at bedside.  Jacob Moses states that yesterday, he felt fine initially.  He was walking back to his apartment at the nursing home where he resides when he began to feel dizzy.  The he notes that he was walking outside in the heat and the dizziness started when he started to feel hot.  Once he was in his apartment, he felt better however later on in the evening, he notes dizziness once again, but this time it was after showering.  He cannot recall how the fall occurred but states it was at approximately 8 PM.  He recalls "the lights went out."  When I asked later on in the conversation how he fell, he stated he cannot remember.  Patient's daughter states that Jacob Moses self-discontinued his own medications.  When asked when he discontinued them, he has had varying replies between 1 to 3 months.  He has had some weight loss, however uncertain timeline.  ED course: On arrival to the ED, patient was hypertensive at 175/97 with heart rate of 52.  He was saturating at 100% on room air.  He was afebrile at 97.7.  Blood pressure improved to 151/119.  Initial workup demonstrates hemoglobin of 12.7, potassium of 6.3, BUN 36, creatinine 1.67 with GFR 38.  Initial troponin elevated at 39 with flat trend to 44.  Initial CK elevated 664. CT of the head and spine with no acute findings, previously known SDH is stable.  Chest x-ray pending.  Patient started on IV fluids, Veltassa,  insulin/D50.  TRH contacted for admission.  Review of Systems: As mentioned in the history of present illness. All other systems reviewed and are negative.  Past Medical History:  Diagnosis Date   H/O vertigo    History of stomach ulcers    HOH (hard of hearing)    Hypertension    Insomnia    Wears dentures    Past Surgical History:  Procedure Laterality Date   APPENDECTOMY  1999   Dr.Byrnett   BELPHAROPTOSIS REPAIR  2010   CATARACT EXTRACTION W/PHACO Left 09/13/2019   Procedure: CATARACT EXTRACTION PHACO AND INTRAOCULAR LENS PLACEMENT (IOC) LEFT  5.06  00:48.8;  Surgeon: Galen Manila, MD;  Location: [facility];  Service: Ophthalmology;  Laterality: Left;   CATARACT EXTRACTION W/PHACO Right 10/04/2019   Procedure: CATARACT EXTRACTION PHACO AND INTRAOCULAR LENS PLACEMENT (IOC) RIGHT;  Surgeon: Galen Manila, MD;  Location: [facility];  Service: Ophthalmology;  Laterality: Right;  4.64 0:43.9   ECTROPION REPAIR Bilateral 04/24/2015   Procedure: REPAIR OF ECTROPION TARSAL WEDGE LEFT EYE REPAIR EXTENSIVE BILATERAL;  Surgeon: Imagene Riches, MD;  Location: [facility];  Service: Ophthalmology;  Laterality: Bilateral;   HERNIA REPAIR  1999   Dr.Byrnett   HIP ARTHROPLASTY Right 11/29/2020   Procedure: ARTHROPLASTY BIPOLAR HIP (HEMIARTHROPLASTY)-Extra Scrub;  Surgeon: Signa Kell, MD;  Location: [facility];  Service: Orthopedics;  Laterality: Right;   IR CATHETER TUBE CHANGE  03/12/2021   LACRIMAL DUCT EXPLORATION Bilateral 04/24/2015   Procedure: LACRIMAL DUCT EXPLORATION PUNCTOPLASTY;  Surgeon: Imagene Riches, MD;  Location: [facility];  Service: Ophthalmology;  Laterality: Bilateral;   SEPTOPLASTY     STOMACH SURGERY  1966   ulcer   Social History:  reports that he quit smoking about 44 years ago. His smoking use included cigarettes. He started smoking about 74 years ago. He has never used smokeless tobacco. He reports that he does not drink alcohol  and does not use drugs.  No Known Allergies  Family History  Problem Relation Age of Onset   Cancer Mother    Diabetes Father     Prior to Admission medications   Medication Sig Start Date End Date Taking? Authorizing Provider  amLODipine (NORVASC) 5 MG tablet Take 5 mg by mouth daily.   Yes [provider]  ALPRAZolam (XANAX XR) 0.5 MG 24 hr tablet Take 1 tablet (0.5 mg total) by mouth daily. 12/18/20   Leroy Sea, MD  finasteride (PROSCAR) 5 MG tablet Take 1 tablet (5 mg total) by mouth daily. 12/18/20   Leroy Sea, MD  Multiple Vitamins-Minerals (MULTIVITAMIN WITH MINERALS) tablet Take 1 tablet by mouth daily.    [provider]  silodosin (RAPAFLO) 8 MG CAPS capsule Take 1 capsule (8 mg total) by mouth daily with breakfast. 07/12/21   Riki Altes, MD    Physical Exam: Vitals:   11/20/22 1148 11/20/22 1300 11/20/22 1330 11/20/22 1400  BP:  (!) 165/89 (!) 164/74 (!) 174/77  Pulse:  (!) 50 (!) 49 (!) 58  Resp:  14 18 14   Temp: 97.8 F (36.6 C)     TempSrc: Oral     SpO2:  99% 100% 100%  Weight:      Height:       Physical Exam Vitals and nursing note reviewed.  Constitutional:      Appearance: He is cachectic. He is not ill-appearing or toxic-appearing.  HENT:     Head: Normocephalic and atraumatic.     Mouth/Throat:     Mouth: Mucous membranes are moist.     Pharynx: Oropharynx is clear.  Eyes:     Conjunctiva/sclera: Conjunctivae normal.     Pupils: Pupils are equal, round, and reactive to light.  Cardiovascular:     Rate and Rhythm: Regular rhythm. Bradycardia present.     Heart sounds: Murmur (3/6 decrescendo systolic murmur heard best at the right upper sternal border) heard.  Pulmonary:     Effort: Pulmonary effort is normal. No respiratory distress.     Breath sounds: Normal breath sounds. No wheezing, rhonchi or rales.  Abdominal:     General: Bowel sounds are normal. There is no distension.     Palpations: Abdomen is  soft.     Tenderness: There is no abdominal tenderness. There is no guarding.  Musculoskeletal:     Right lower leg: No edema.     Left lower leg: No edema.  Skin:    General: Skin is warm and dry.     Comments: Large skin tear to the right lower extremity that is now wrapped.  Some nonpitting edema surrounding.  Neurological:     Mental Status: He is alert.     Comments:  Patient is alert and oriented to self, and somewhat to the situation.  His replies to situational questions do vary, concerning for memory/cognitive dysfunction 5 out of 5 strength of bilateral upper extremities 4 out of  5 strength of bilateral lower extremities No facial asymmetry or dysarthria  Psychiatric:        Mood and Affect: Mood normal.        Behavior: Behavior normal.        Cognition and Memory: He exhibits impaired recent memory.    Data Reviewed: CBC with WBC of 9.5, hemoglobin 12.7, MCV of 97, platelets of 254 CMP with sodium of 134, potassium 6.3, bicarb 26, glucose 110, BUN 36, creatinine 1.67, AST 30, ALT 17, GFR 38 CK6 164 and then 587 Troponin 39 and 44  EKG personally reviewed.  Sinus bradycardia with rate of 51.  Borderline first-degree AV block.  Peaked T waves.  No ST or T wave changes consistent with acute ischemia though  DG Chest Port 1 View  Result Date: 11/20/2022 CLINICAL DATA:  Hyperkalemia, mild rhabdomyolysis, possible syncopal episode. EXAM: PORTABLE CHEST 1 VIEW COMPARISON:  CXR 12/13/20 FINDINGS: No pleural effusion. No pneumothorax. Normal cardiac and mediastinal contours. No radiographically apparent displaced rib fractures. There is hazy opacity in the right perihilar region that could represent atelectasis or infection. No radiographically apparent displaced rib fractures. Visualized upper abdomen is unremarkable. IMPRESSION: Hazy opacity in the right perihilar region could represent atelectasis or infection. Electronically Signed   By: Lorenza Cambridge M.D.   On: 11/20/2022 12:41    CT Head Wo Contrast  Result Date: 11/20/2022 CLINICAL DATA:  Blunt poly trauma EXAM: CT HEAD WITHOUT CONTRAST CT CERVICAL SPINE WITHOUT CONTRAST TECHNIQUE: Multidetector CT imaging of the head and cervical spine was performed following the standard protocol without intravenous contrast. Multiplanar CT image reconstructions of the cervical spine were also generated. RADIATION DOSE REDUCTION: This exam was performed according to the departmental dose-optimization program which includes automated exposure control, adjustment of the mA and/or kV according to patient size and/or use of iterative reconstruction technique. COMPARISON:  11/29/2020 FINDINGS: CT HEAD FINDINGS Brain: Intermediate to low-density subdural collection along the left cerebral convexity measuring up to 5 mm in thickness on coronal reformats. No significant mass effect on the atrophic brain. Chronic small vessel ischemia in the cerebral white matter. No evidence of acute infarct, hydrocephalus, or mass. Vascular: No hyperdense vessel or unexpected calcification. Skull: Negative for fracture Sinuses/Orbits: No visible injury CT CERVICAL SPINE FINDINGS Alignment: No traumatic malalignment.  Mild fused dextrocurvature. Skull base and vertebrae: No acute fracture or focal bone lesion. Soft tissues and spinal canal: No prevertebral fluid or swelling. No visible canal hematoma. Disc levels: Generalized cervical spine ankylosis from bridging osteophytes/syndesmophytes spanning C3-T2. The open levels show degenerative joint space narrowing and ridging. Upper chest: No evidence of injury IMPRESSION: 1. Low-density/nonacute subdural hematoma along the left cerebral convexity measuring up to 5 mm in thickness. No associated mass effect 2. Negative for cervical spine fracture or subluxation. Extensive cervicothoracic ankylosis. Electronically Signed   By: Tiburcio Pea M.D.   On: 11/20/2022 08:06   CT Cervical Spine Wo Contrast  Result Date:  11/20/2022 CLINICAL DATA:  Blunt poly trauma EXAM: CT HEAD WITHOUT CONTRAST CT CERVICAL SPINE WITHOUT CONTRAST TECHNIQUE: Multidetector CT imaging of the head and cervical spine was performed following the standard protocol without intravenous contrast. Multiplanar CT image reconstructions of the cervical spine were also generated. RADIATION DOSE REDUCTION: This exam was performed according to the departmental dose-optimization program which includes automated exposure control, adjustment of the mA and/or kV according to patient size and/or use of iterative reconstruction technique. COMPARISON:  11/29/2020 FINDINGS: CT HEAD FINDINGS Brain: Intermediate to low-density  subdural collection along the left cerebral convexity measuring up to 5 mm in thickness on coronal reformats. No significant mass effect on the atrophic brain. Chronic small vessel ischemia in the cerebral white matter. No evidence of acute infarct, hydrocephalus, or mass. Vascular: No hyperdense vessel or unexpected calcification. Skull: Negative for fracture Sinuses/Orbits: No visible injury CT CERVICAL SPINE FINDINGS Alignment: No traumatic malalignment.  Mild fused dextrocurvature. Skull base and vertebrae: No acute fracture or focal bone lesion. Soft tissues and spinal canal: No prevertebral fluid or swelling. No visible canal hematoma. Disc levels: Generalized cervical spine ankylosis from bridging osteophytes/syndesmophytes spanning C3-T2. The open levels show degenerative joint space narrowing and ridging. Upper chest: No evidence of injury IMPRESSION: 1. Low-density/nonacute subdural hematoma along the left cerebral convexity measuring up to 5 mm in thickness. No associated mass effect 2. Negative for cervical spine fracture or subluxation. Extensive cervicothoracic ankylosis. Electronically Signed   By: Tiburcio Pea M.D.   On: 11/20/2022 08:06    Results are pending, will review when available.  Assessment and Plan:  *  Syncope Patient is presenting after syncopal yesterday evening after which he laid on the ground due to inability to get up independently.  Mild rhabdomyolysis noted, however no acute fractures patient was able to ambulate with his walker afterwards.   - Telemetry monitoring given bradycardia - Management of hyperkalemia as noted below - Given history of moderate AI in 2021, repeat echocardiogram.  Patient not a candidate for any advanced therapies, however may help with goals of care discussions - PT/OT  Hyperkalemia Patient has a history of hyperkalemia in the past.  Uncertain etiology at this time as patient's renal dysfunction is not so severe.  Rhabdomyolysis, however this is also mild.  He is not on any medications that can cause this.  EKG notable for borderline PR prolongation and peaked T waves.  - Telemetry monitoring - S/p IV fluids - Insulin and Lokelma ordered - Recheck potassium this evening  Bradycardia History of sinus bradycardia, with current rates in the 50s.  - Telemetry monitoring  Opacity of lung on imaging study Chest x-ray noted to have a hazy opacity.  Lung examination is reassuring and patient declines any shortness of breath.  Potentially aspiration pneumonitis given syncopal episode, not quite warranting antimicrobials at this point.  Will check a CT of the chest for further characterization.  - CT of the chest without contrast  HTN (hypertension) Patient was significantly hypertensive on presentation, likely due to self discontinuation of home antihypertensives.  Given advanced age and patient preference, can hold off on restarting at this time.  CKD (chronic kidney disease), stage IIIb Patient has a history of CKD, initially stage IIIa, however currently seems stage IIIb.  He has previously refused follow-up with nephrology.  Will continue to monitor while admitted  - Repeat BMP in the a.m.  Benign prostatic hyperplasia with lower urinary tract  symptoms - Continue home medications - Bladder scan to evaluate for retention  Rhabdomyolysis Mild elevation of CK, I would not expect this to cause any renal dysfunction.  - S/p 1.25 L of normal saline  Anxiety Patient is on Xanax 0.5 mg daily at home for insomnia and anxiety.  I am concerned this may have contributed to his ground-level fall.  Can restart but at a lower dose  - Xanax 0.25 mg nightly  SDH (subdural hematoma) (HCC) Stable on CT imaging.  No further interventions at this point.  Advance Care Planning:   Code Status: DNR/DNI.  Consistent with prior wishes, confirmed by patient's daughter at bedside and patient  Consults: None  Family Communication: Patient's daughter Lupita Leash updated at bedside  Severity of Illness: The appropriate patient status for this patient is OBSERVATION. Observation status is judged to be reasonable and necessary in order to provide the required intensity of service to ensure the patient's safety. The patient's presenting symptoms, physical exam findings, and initial radiographic and laboratory data in the context of their medical condition is felt to place them at decreased risk for further clinical deterioration. Furthermore, it is anticipated that the patient will be medically stable for discharge from the hospital within 2 midnights of admission.   Author: Verdene Lennert, MD 11/20/2022 2:59 PM  For on call review [email or web address].

## 2022-11-20 NOTE — Progress Notes (Signed)
SLP Cancellation Note  Patient Details Name: Jacob Moses MRN: [id number] DOB: 05/16/1927   Cancelled treatment:       Reason Eval/Treat Not Completed: SLP screened, no needs identified, will sign off (SLP consult received and appreciated. Per chart review, pt with hx of dementia,  L SDH which is stable per imaging. Pt resides in SNF. If a cognitive-linguistic evaluation is indicated, it should be completed in home/functional environment.)  Clyde Canterbury, M.S., CCC-SLP Speech-Language Pathologist [facility] [phone] Arnette Felts)  Woodroe Chen 11/20/2022, 1:44 PM

## 2022-11-20 NOTE — Assessment & Plan Note (Signed)
Mild elevation of CK, I would not expect this to cause any renal dysfunction.  - S/p 1.25 L of normal saline

## 2022-11-20 NOTE — Assessment & Plan Note (Signed)
Stable on CT imaging.  No further interventions at this point.

## 2022-11-21 ENCOUNTER — Observation Stay
Admit: 2022-11-21 | Discharge: 2022-11-21 | Disposition: A | Discharge status: Home / Self Care | Payer: Medicare Other | Attending: Internal Medicine | Admitting: Internal Medicine

## 2022-11-21 DIAGNOSIS — E876 Hypokalemia: Secondary | ICD-10-CM | POA: Diagnosis present

## 2022-11-21 DIAGNOSIS — E43 Unspecified severe protein-calorie malnutrition: Secondary | ICD-10-CM | POA: Diagnosis present

## 2022-11-21 DIAGNOSIS — Z9181 History of falling: Secondary | ICD-10-CM | POA: Diagnosis not present

## 2022-11-21 DIAGNOSIS — N1831 Chronic kidney disease, stage 3a: Secondary | ICD-10-CM | POA: Diagnosis present

## 2022-11-21 DIAGNOSIS — N401 Enlarged prostate with lower urinary tract symptoms: Secondary | ICD-10-CM | POA: Diagnosis present

## 2022-11-21 DIAGNOSIS — Z66 Do not resuscitate: Secondary | ICD-10-CM | POA: Diagnosis present

## 2022-11-21 DIAGNOSIS — F05 Delirium due to known physiological condition: Secondary | ICD-10-CM | POA: Diagnosis present

## 2022-11-21 DIAGNOSIS — I4891 Unspecified atrial fibrillation: Secondary | ICD-10-CM | POA: Diagnosis present

## 2022-11-21 DIAGNOSIS — W1830XA Fall on same level, unspecified, initial encounter: Secondary | ICD-10-CM | POA: Diagnosis present

## 2022-11-21 DIAGNOSIS — R55 Syncope and collapse: Secondary | ICD-10-CM | POA: Diagnosis present

## 2022-11-21 DIAGNOSIS — I7121 Aneurysm of the ascending aorta, without rupture: Secondary | ICD-10-CM | POA: Diagnosis present

## 2022-11-21 DIAGNOSIS — R64 Cachexia: Secondary | ICD-10-CM | POA: Diagnosis present

## 2022-11-21 DIAGNOSIS — I440 Atrioventricular block, first degree: Secondary | ICD-10-CM | POA: Diagnosis present

## 2022-11-21 DIAGNOSIS — T796XXA Traumatic ischemia of muscle, initial encounter: Secondary | ICD-10-CM | POA: Diagnosis present

## 2022-11-21 DIAGNOSIS — I444 Left anterior fascicular block: Secondary | ICD-10-CM | POA: Diagnosis present

## 2022-11-21 DIAGNOSIS — F419 Anxiety disorder, unspecified: Secondary | ICD-10-CM | POA: Diagnosis present

## 2022-11-21 DIAGNOSIS — G4700 Insomnia, unspecified: Secondary | ICD-10-CM | POA: Diagnosis present

## 2022-11-21 DIAGNOSIS — K219 Gastro-esophageal reflux disease without esophagitis: Secondary | ICD-10-CM | POA: Diagnosis present

## 2022-11-21 DIAGNOSIS — R001 Bradycardia, unspecified: Secondary | ICD-10-CM | POA: Diagnosis present

## 2022-11-21 DIAGNOSIS — Z87891 Personal history of nicotine dependence: Secondary | ICD-10-CM | POA: Diagnosis not present

## 2022-11-21 DIAGNOSIS — H9190 Unspecified hearing loss, unspecified ear: Secondary | ICD-10-CM | POA: Diagnosis present

## 2022-11-21 DIAGNOSIS — R42 Dizziness and giddiness: Secondary | ICD-10-CM | POA: Diagnosis not present

## 2022-11-21 DIAGNOSIS — I129 Hypertensive chronic kidney disease with stage 1 through stage 4 chronic kidney disease, or unspecified chronic kidney disease: Secondary | ICD-10-CM | POA: Diagnosis present

## 2022-11-21 DIAGNOSIS — E860 Dehydration: Secondary | ICD-10-CM | POA: Diagnosis present

## 2022-11-21 DIAGNOSIS — I472 Ventricular tachycardia, unspecified: Secondary | ICD-10-CM | POA: Diagnosis present

## 2022-11-21 DIAGNOSIS — E875 Hyperkalemia: Secondary | ICD-10-CM | POA: Diagnosis present

## 2022-11-21 DIAGNOSIS — Z681 Body mass index (BMI) 19 or less, adult: Secondary | ICD-10-CM | POA: Diagnosis not present

## 2022-11-21 LAB — ECHOCARDIOGRAM COMPLETE
AR max vel: 3.03 cm2
AV Area VTI: 3.42 cm2
AV Area mean vel: 2.83 cm2
AV Mean grad: 2.5 mmHg
AV Peak grad: 4.6 mmHg
Ao pk vel: 1.07 m/s
Area-P 1/2: 1.91 cm2
Height: 72 in
MV VTI: 1.95 cm2
S' Lateral: 2.4 cm
Weight: 2217.6 oz

## 2022-11-21 LAB — CBC
HCT: 38.3 % — ABNORMAL LOW (ref 39.0–52.0)
Hemoglobin: 12.2 g/dL — ABNORMAL LOW (ref 13.0–17.0)
MCH: 31.2 pg (ref 26.0–34.0)
MCHC: 31.9 g/dL (ref 30.0–36.0)
MCV: 98 fL (ref 80.0–100.0)
Platelets: 240 10*3/uL (ref 150–400)
RBC: 3.91 MIL/uL — ABNORMAL LOW (ref 4.22–5.81)
RDW: 12.5 % (ref 11.5–15.5)
WBC: 8.2 10*3/uL (ref 4.0–10.5)
nRBC: 0 % (ref 0.0–0.2)

## 2022-11-21 LAB — BASIC METABOLIC PANEL
Anion gap: 9 (ref 5–15)
BUN: 35 mg/dL — ABNORMAL HIGH (ref 8–23)
CO2: 24 mmol/L (ref 22–32)
Calcium: 8.7 mg/dL — ABNORMAL LOW (ref 8.9–10.3)
Chloride: 101 mmol/L (ref 98–111)
Creatinine, Ser: 1.69 mg/dL — ABNORMAL HIGH (ref 0.61–1.24)
GFR, Estimated: 37 mL/min — ABNORMAL LOW (ref >60)
Glucose, Bld: 100 mg/dL — ABNORMAL HIGH (ref 70–99)
Potassium: 5.7 mmol/L — ABNORMAL HIGH (ref 3.5–5.1)
Sodium: 134 mmol/L — ABNORMAL LOW (ref 135–145)

## 2022-11-21 MED ORDER — ENSURE ENLIVE PO LIQD
237.0000 mL | Freq: Three times a day (TID) | ORAL | Status: DC
  Administered 2022-11-21 – 2022-11-25 (×12): 237 mL via ORAL

## 2022-11-21 MED ORDER — MECLIZINE HCL 25 MG PO TABS
25.0000 mg | ORAL_TABLET | Freq: Three times a day (TID) | ORAL | Status: DC | PRN
  Administered 2022-11-22: 25 mg via ORAL
  Filled 2022-11-21 (×2): qty 1

## 2022-11-21 MED ORDER — ADULT MULTIVITAMIN W/MINERALS CH
1.0000 | ORAL_TABLET | Freq: Every day | ORAL | Status: DC
  Administered 2022-11-21 – 2022-11-25 (×5): 1 via ORAL
  Filled 2022-11-21 (×5): qty 1

## 2022-11-21 MED ORDER — SODIUM ZIRCONIUM CYCLOSILICATE 10 G PO PACK
10.0000 g | PACK | Freq: Two times a day (BID) | ORAL | Status: DC
  Administered 2022-11-21 – 2022-11-24 (×7): 10 g via ORAL
  Filled 2022-11-21 (×8): qty 1

## 2022-11-21 MED ORDER — SODIUM CHLORIDE 0.9 % IV SOLN: INTRAVENOUS | Status: AC

## 2022-11-21 MED ORDER — HYDRALAZINE HCL 20 MG/ML IJ SOLN
10.0000 mg | Freq: Four times a day (QID) | INTRAMUSCULAR | Status: DC | PRN
  Administered 2022-11-21 – 2022-11-22 (×2): 10 mg via INTRAVENOUS
  Filled 2022-11-21 (×2): qty 1

## 2022-11-21 MED ORDER — AMLODIPINE BESYLATE 10 MG PO TABS
10.0000 mg | ORAL_TABLET | Freq: Every day | ORAL | Status: DC
  Administered 2022-11-21 – 2022-11-24 (×4): 10 mg via ORAL
  Filled 2022-11-21 (×4): qty 1

## 2022-11-21 NOTE — Consult Note (Signed)
CARDIOLOGY CONSULT NOTE               Patient ID: DAQWAN DOUGAL MRN: [id number] DOB/AGE: 1927-08-14 89 y.o.  Admit date: 11/20/2022 Referring Physician Imagene Gurney hospitalist  Primary Physician Dewaine Oats MD primary Cardiologist Dr. Darrold Junker  Reason for Consultation syncope fall hypokalemia  HPI: 89 year old patient with hypertension urinary retention SDH chronic renal sufficiency reportedly had a fall.  At home laid on the floor for quite some time overnight until found by staff and his independent living situation.  Patient does remember how he fell but had some dizziness weakness and fatigue his daughter instructed him to send him to the emergency room patient has been followed by cardiology but quit taking all medications and following up with cardiology about 2 years ago he did not want anything else done but was brought to the emergency room at his daughter's insistence.  Review of systems complete and found to be negative unless listed above     Past Medical History:  Diagnosis Date   H/O vertigo    History of stomach ulcers    HOH (hard of hearing)    Hypertension    Insomnia    Wears dentures     Past Surgical History:  Procedure Laterality Date   APPENDECTOMY  1999   Dr.Byrnett   BELPHAROPTOSIS REPAIR  2010   CATARACT EXTRACTION W/PHACO Left 09/13/2019   Procedure: CATARACT EXTRACTION PHACO AND INTRAOCULAR LENS PLACEMENT (IOC) LEFT  5.06  00:48.8;  Surgeon: Galen Manila, MD;  Location: [facility];  Service: Ophthalmology;  Laterality: Left;   CATARACT EXTRACTION W/PHACO Right 10/04/2019   Procedure: CATARACT EXTRACTION PHACO AND INTRAOCULAR LENS PLACEMENT (IOC) RIGHT;  Surgeon: Galen Manila, MD;  Location: [facility];  Service: Ophthalmology;  Laterality: Right;  4.64 0:43.9   ECTROPION REPAIR Bilateral 04/24/2015   Procedure: REPAIR OF ECTROPION TARSAL WEDGE LEFT EYE REPAIR EXTENSIVE BILATERAL;  Surgeon: Imagene Riches, MD;   Location: [facility];  Service: Ophthalmology;  Laterality: Bilateral;   HERNIA REPAIR  1999   Dr.Byrnett   HIP ARTHROPLASTY Right 11/29/2020   Procedure: ARTHROPLASTY BIPOLAR HIP (HEMIARTHROPLASTY)-Extra Scrub;  Surgeon: Signa Kell, MD;  Location: [facility];  Service: Orthopedics;  Laterality: Right;   IR CATHETER TUBE CHANGE  03/12/2021   LACRIMAL DUCT EXPLORATION Bilateral 04/24/2015   Procedure: LACRIMAL DUCT EXPLORATION PUNCTOPLASTY;  Surgeon: Imagene Riches, MD;  Location: [facility];  Service: Ophthalmology;  Laterality: Bilateral;   SEPTOPLASTY     STOMACH SURGERY  1966   ulcer    Medications Prior to Admission  Medication Sig Dispense Refill Last Dose   acetaminophen (TYLENOL) 500 MG tablet Take 500 mg by mouth at bedtime.   11/19/2022 at 2000   ALPRAZolam (XANAX XR) 0.5 MG 24 hr tablet Take 1 tablet (0.5 mg total) by mouth daily. 5 tablet 0 11/19/2022 at 2000   amLODipine (NORVASC) 5 MG tablet Take 5 mg by mouth daily. (Patient not taking: Reported on 11/20/2022)   Not Taking   finasteride (PROSCAR) 5 MG tablet Take 1 tablet (5 mg total) by mouth daily. (Patient not taking: Reported on 11/20/2022)   Not Taking   Multiple Vitamins-Minerals (MULTIVITAMIN WITH MINERALS) tablet Take 1 tablet by mouth daily. (Patient not taking: Reported on 11/20/2022)   Not Taking   silodosin (RAPAFLO) 8 MG CAPS capsule Take 1 capsule (8 mg total) by mouth daily with breakfast. (Patient not taking: Reported on 11/20/2022) 90 capsule 3 Not Taking  Social History   Socioeconomic History   Marital status: Widowed    Spouse name: Not on file   Number of children: Not on file   Years of education: Not on file   Highest education level: Not on file  Occupational History   Not on file  Tobacco Use   Smoking status: Former    Current packs/day: 0.00    Types: Cigarettes    Start date: 05/05/1948    Quit date: 05/05/1978    Years since quitting: 44.5   Smokeless tobacco: Never  Vaping Use    Vaping status: Never Used  Substance and Sexual Activity   Alcohol use: No   Drug use: No   Sexual activity: Not Currently  Other Topics Concern   Not on file  Social History Narrative   Not on file   Social Determinants of Health   Financial Resource Strain: Not on file  Food Insecurity: No Food Insecurity (11/20/2022)   Hunger Vital Sign    Worried About Running Out of Food in the Last Year: Never true    Ran Out of Food in the Last Year: Never true  Transportation Needs: No Transportation Needs (11/20/2022)   PRAPARE - Administrator, Civil Service (Medical): No    Lack of Transportation (Non-Medical): No  Physical Activity: Not on file  Stress: Not on file  Social Connections: Not on file  Intimate Partner Violence: Not At Risk (11/20/2022)   Humiliation, Afraid, Rape, and Kick questionnaire    Fear of Current or Ex-Partner: No    Emotionally Abused: No    Physically Abused: No    Sexually Abused: No    Family History  Problem Relation Age of Onset   Cancer Mother    Diabetes Father       Review of systems complete and found to be negative unless listed above      PHYSICAL EXAM  General: Well developed, well nourished, in no acute distress HEENT:  Normocephalic and atramatic Neck:  No JVD.  Lungs: Clear bilaterally to auscultation and percussion. Heart: HRRR . Normal S1 and S2 without gallops or murmurs.  Abdomen: Bowel sounds are positive, abdomen soft and non-tender  Msk:  Back normal, normal gait. Normal strength and tone for age. Extremities: No clubbing, cyanosis or edema.   Neuro: Alert and oriented X 3. Psych:  Good affect, responds appropriately  Labs:   Lab Results  Component Value Date   WBC 8.2 11/21/2022   HGB 12.2 (L) 11/21/2022   HCT 38.3 (L) 11/21/2022   MCV 98.0 11/21/2022   PLT 240 11/21/2022    Recent Labs  Lab 11/20/22 0732 11/20/22 1037 11/21/22 0859  NA 134*   < > 134*  K 6.3*   < > 5.7*  CL 102   < > 101  CO2  26   < > 24  BUN 36*   < > 35*  CREATININE 1.67*   < > 1.69*  CALCIUM 8.9   < > 8.7*  PROT 7.6  --   --   BILITOT 0.6  --   --   ALKPHOS 77  --   --   ALT 17  --   --   AST 30  --   --   GLUCOSE 110*   < > 100*   < > = values in this interval not displayed.   Lab Results  Component Value Date   CKTOTAL 587 (H) 11/20/2022   No results  found for: "CHOL" No results found for: "HDL" No results found for: "LDLCALC" No results found for: "TRIG" No results found for: "CHOLHDL" No results found for: "LDLDIRECT"    Radiology: ECHOCARDIOGRAM COMPLETE  Result Date: 11/21/2022    ECHOCARDIOGRAM REPORT   Patient Name:   KAEDEN MESTER Date of Exam: 11/21/2022 Medical Rec #:  [id number]       Height:       72.0 in Accession #:    [id number]      Weight:       138.6 lb Date of Birth:  Sep 18, 1927       BSA:          1.823 m Patient Age:    89 years        BP:           153/73 mmHg Patient Gender: M               HR:           53 bpm. Exam Location:  [facility] Procedure: 2D Echo, Cardiac Doppler and Color Doppler Indications:     Syncope R55  History:         Patient has no prior history of Echocardiogram examinations.                  Risk Factors:Hypertension.  Sonographer:     Cristela Blue Referring Phys:  [id number] Verdene Lennert Diagnosing Phys: Alwyn Pea MD  Sonographer Comments: Image acquisition challenging due to patient body habitus and Pt has pectus excavatum and very bony thorax. IMPRESSIONS  1. Left ventricular ejection fraction, by estimation, is 60 to 65%. The left ventricle has normal function. The left ventricle has no regional wall motion abnormalities. There is moderate concentric left ventricular hypertrophy. Left ventricular diastolic parameters are consistent with Grade I diastolic dysfunction (impaired relaxation).  2. Right ventricular systolic function is normal. The right ventricular size is normal.  3. The mitral valve is grossly normal. Trivial mitral valve regurgitation.  4. The  aortic valve is normal in structure. Aortic valve regurgitation is mild. FINDINGS  Left Ventricle: Left ventricular ejection fraction, by estimation, is 60 to 65%. The left ventricle has normal function. The left ventricle has no regional wall motion abnormalities. The left ventricular internal cavity size was normal in size. There is  moderate concentric left ventricular hypertrophy. Left ventricular diastolic parameters are consistent with Grade I diastolic dysfunction (impaired relaxation). Right Ventricle: The right ventricular size is normal. No increase in right ventricular wall thickness. Right ventricular systolic function is normal. Left Atrium: Left atrial size was normal in size. Right Atrium: Right atrial size was not assessed. Pericardium: There is no evidence of pericardial effusion. Mitral Valve: The mitral valve is grossly normal. Trivial mitral valve regurgitation. MV peak gradient, 7.4 mmHg. The mean mitral valve gradient is 2.0 mmHg. Tricuspid Valve: The tricuspid valve is grossly normal. Tricuspid valve regurgitation is trivial. Aortic Valve: The aortic valve is normal in structure. Aortic valve regurgitation is mild. Aortic valve mean gradient measures 2.5 mmHg. Aortic valve peak gradient measures 4.6 mmHg. Aortic valve area, by VTI measures 3.42 cm. Pulmonic Valve: The pulmonic valve was normal in structure. Pulmonic valve regurgitation is not visualized. Aorta: The ascending aorta was not well visualized. IAS/Shunts: No atrial level shunt detected by color flow Doppler.  LEFT VENTRICLE PLAX 2D LVIDd:         3.60 cm   Diastology LVIDs:  2.40 cm   LV e' medial:    3.81 cm/s LV PW:         1.70 cm   LV E/e' medial:  15.1 LV IVS:        1.30 cm   LV e' lateral:   3.92 cm/s LVOT diam:     2.10 cm   LV E/e' lateral: 14.7 LV SV:         72 LV SV Index:   40 LVOT Area:     3.46 cm  RIGHT VENTRICLE RV Basal diam:  3.30 cm RV Mid diam:    2.60 cm RV S prime:     8.70 cm/s TAPSE (M-mode): 1.4  cm LEFT ATRIUM           Index        RIGHT ATRIUM           Index LA diam:      3.80 cm 2.08 cm/m   RA Area:     14.70 cm LA Vol (A2C): 15.6 ml 8.56 ml/m   RA Volume:   35.00 ml  19.20 ml/m LA Vol (A4C): 28.4 ml 15.58 ml/m  AORTIC VALVE AV Area (Vmax):    3.03 cm AV Area (Vmean):   2.83 cm AV Area (VTI):     3.42 cm AV Vmax:           106.85 cm/s AV Vmean:          72.900 cm/s AV VTI:            0.212 m AV Peak Grad:      4.6 mmHg AV Mean Grad:      2.5 mmHg LVOT Vmax:         93.40 cm/s LVOT Vmean:        59.600 cm/s LVOT VTI:          0.209 m LVOT/AV VTI ratio: 0.99  AORTA Ao Root diam: 2.50 cm MITRAL VALVE                TRICUSPID VALVE MV Area (PHT): 1.91 cm     TR Peak grad:   18.0 mmHg MV Area VTI:   1.95 cm     TR Vmax:        212.00 cm/s MV Peak grad:  7.4 mmHg MV Mean grad:  2.0 mmHg     SHUNTS MV Vmax:       1.36 m/s     Systemic VTI:  0.21 m MV Vmean:      60.3 cm/s    Systemic Diam: 2.10 cm MV Decel Time: 398 msec MV E velocity: 57.60 cm/s MV A velocity: 114.00 cm/s MV E/A ratio:  0.51 Hollis Tuller D Donal Lynam MD Electronically signed by Alwyn Pea MD Signature Date/Time: 11/21/2022/2:52:05 PM    Final    CT CHEST WO CONTRAST  Result Date: 11/20/2022 CLINICAL DATA:  Respiratory illness, recent fall EXAM: CT CHEST WITHOUT CONTRAST TECHNIQUE: Multidetector CT imaging of the chest was performed following the standard protocol without IV contrast. RADIATION DOSE REDUCTION: This exam was performed according to the departmental dose-optimization program which includes automated exposure control, adjustment of the mA and/or kV according to patient size and/or use of iterative reconstruction technique. COMPARISON:  Previous studies including chest radiograph done today FINDINGS: Cardiovascular: Heart is enlarged in size. Coronary artery calcifications are seen. Calcifications are seen in thoracic aorta and its major branches. There is ectasia of ascending thoracic aorta measuring 4.1 cm. There is  ectasia of left side of the aortic arch measuring 4.1 cm. Midportion of the aortic arch measures 3.2 cm. Descending thoracic aorta measures 3.2 cm. Main pulmonary artery measures 4.1 cm. Mediastinum/Nodes: There are a few subcentimeter nodes seen mediastinum, possibly reactive hyperplasia. Thyroid is smaller than usual in size. Lungs/Pleura: There is no focal pulmonary consolidation. Breathing motion limits evaluation of lower lung fields. Small linear densities are seen in the posterior lower lung fields suggesting scarring or subsegmental atelectasis. Left hemidiaphragm is elevated. There is calcified nodule in lingula. There is no pleural effusion or pneumothorax. Upper Abdomen: Calcifications are seen in abdominal aorta and its major branches. Surgical clips are noted along the lesser curvature aspect of stomach. High density in the lumen of stomach may be Oral medication. Musculoskeletal: There is a fusion of anterior aspects of multiple thoracic vertebral bodies suggesting possible ankylosing spondylitis. Similar finding is also noted in the visualized lower cervical spine and upper lumbar spine. IMPRESSION: There is no focal pulmonary consolidation. There is no pleural effusion or pneumothorax. Small linear densities in the lower lung fields may suggest scarring or subsegmental atelectasis. Part of this finding may be due to motion artifacts. There is no pleural effusion or pneumothorax. Cardiomegaly. Coronary artery disease. Aortic arteriosclerosis. There is ectasia of the main pulmonary artery suggesting pulmonary arterial hypertension. 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 Recommend annual imaging followup by CTA or MRA. This recommendation follows 2010 ACCF/AHA/AATS/ACR/ASA/SCA/SCAI/SIR/STS/SVM Guidelines for the Diagnosis and Management of Patients with Thoracic Aortic Disease. Circulation. 2010; 121: Z610-R604. Aortic aneurysm  NOS (ICD10-I71.9) Other findings as described in the body of the report. Electronically Signed   By: Ernie Avena M.D.   On: 11/20/2022 15:25   DG Chest Port 1 View  Result Date: 11/20/2022 CLINICAL DATA:  Hyperkalemia, mild rhabdomyolysis, possible syncopal episode. EXAM: PORTABLE CHEST 1 VIEW COMPARISON:  CXR 12/13/20 FINDINGS: No pleural effusion. No pneumothorax. Normal cardiac and mediastinal contours. No radiographically apparent displaced rib fractures. There is hazy opacity in the right perihilar region that could represent atelectasis or infection. No radiographically apparent displaced rib fractures. Visualized upper abdomen is unremarkable. IMPRESSION: Hazy opacity in the right perihilar region could represent atelectasis or infection. Electronically Signed   By: Lorenza Cambridge M.D.   On: 11/20/2022 12:41   CT Head Wo Contrast  Result Date: 11/20/2022 CLINICAL DATA:  Blunt poly trauma EXAM: CT HEAD WITHOUT CONTRAST CT CERVICAL SPINE WITHOUT CONTRAST TECHNIQUE: Multidetector CT imaging of the head and cervical spine was performed following the standard protocol without intravenous contrast. Multiplanar CT image reconstructions of the cervical spine were also generated. RADIATION DOSE REDUCTION: This exam was performed according to the departmental dose-optimization program which includes automated exposure control, adjustment of the mA and/or kV according to patient size and/or use of iterative reconstruction technique. COMPARISON:  11/29/2020 FINDINGS: CT HEAD FINDINGS Brain: Intermediate to low-density subdural collection along the left cerebral convexity measuring up to 5 mm in thickness on coronal reformats. No significant mass effect on the atrophic brain. Chronic small vessel ischemia in the cerebral white matter. No evidence of acute infarct, hydrocephalus, or mass. Vascular: No hyperdense vessel or unexpected calcification. Skull: Negative for fracture Sinuses/Orbits: No visible injury  CT CERVICAL SPINE FINDINGS Alignment: No traumatic malalignment.  Mild fused dextrocurvature. Skull base and vertebrae: No acute fracture or focal bone lesion. Soft tissues and spinal canal: No prevertebral fluid or swelling. No visible  canal hematoma. Disc levels: Generalized cervical spine ankylosis from bridging osteophytes/syndesmophytes spanning C3-T2. The open levels show degenerative joint space narrowing and ridging. Upper chest: No evidence of injury IMPRESSION: 1. Low-density/nonacute subdural hematoma along the left cerebral convexity measuring up to 5 mm in thickness. No associated mass effect 2. Negative for cervical spine fracture or subluxation. Extensive cervicothoracic ankylosis. Electronically Signed   By: Tiburcio Pea M.D.   On: 11/20/2022 08:06   CT Cervical Spine Wo Contrast  Result Date: 11/20/2022 CLINICAL DATA:  Blunt poly trauma EXAM: CT HEAD WITHOUT CONTRAST CT CERVICAL SPINE WITHOUT CONTRAST TECHNIQUE: Multidetector CT imaging of the head and cervical spine was performed following the standard protocol without intravenous contrast. Multiplanar CT image reconstructions of the cervical spine were also generated. RADIATION DOSE REDUCTION: This exam was performed according to the departmental dose-optimization program which includes automated exposure control, adjustment of the mA and/or kV according to patient size and/or use of iterative reconstruction technique. COMPARISON:  11/29/2020 FINDINGS: CT HEAD FINDINGS Brain: Intermediate to low-density subdural collection along the left cerebral convexity measuring up to 5 mm in thickness on coronal reformats. No significant mass effect on the atrophic brain. Chronic small vessel ischemia in the cerebral white matter. No evidence of acute infarct, hydrocephalus, or mass. Vascular: No hyperdense vessel or unexpected calcification. Skull: Negative for fracture Sinuses/Orbits: No visible injury CT CERVICAL SPINE FINDINGS Alignment: No  traumatic malalignment.  Mild fused dextrocurvature. Skull base and vertebrae: No acute fracture or focal bone lesion. Soft tissues and spinal canal: No prevertebral fluid or swelling. No visible canal hematoma. Disc levels: Generalized cervical spine ankylosis from bridging osteophytes/syndesmophytes spanning C3-T2. The open levels show degenerative joint space narrowing and ridging. Upper chest: No evidence of injury IMPRESSION: 1. Low-density/nonacute subdural hematoma along the left cerebral convexity measuring up to 5 mm in thickness. No associated mass effect 2. Negative for cervical spine fracture or subluxation. Extensive cervicothoracic ankylosis. Electronically Signed   By: Tiburcio Pea M.D.   On: 11/20/2022 08:06    EKG: NSR 70 peaked T-waves NSSTW RBBB LAHB  ASSESSMENT AND PLAN:  Syncope Hyperkalemia Bradycardia Hypertension CRI III Anxiety SDH . Plan Reported syncope unclear etiology recommend limited cardiac workup patient is reluctant to do any invasive procedures Hypokalemia continue to correct elevated potassium leukemia Generalized weakness possible mild dehydration recommend rehydrate gently Fall appears to be mechanical no obvious injury mild rhabdo recommend hydration Renal insufficiency stage III continue hydrate avoid nephrotoxic drugs Recommend conservative management  Signed: Alwyn Pea MD 11/21/2022, 5:14 PM

## 2022-11-21 NOTE — Progress Notes (Signed)
Initial Nutrition Assessment  DOCUMENTATION CODES:   Severe malnutrition in context of social or environmental circumstances, Underweight  INTERVENTION:   -MVI with minerals daily -Ensure Enlive po TID, each supplement provides 350 kcal and 20 grams of protein.  -Downgrade diet to dysphagia 3 diet for ease on intake (pt with no teeth)  NUTRITION DIAGNOSIS:   Severe Malnutrition related to social / environmental circumstances as evidenced by severe fat depletion, severe muscle depletion.  GOAL:   Patient will meet greater than or equal to 90% of their needs  MONITOR:   PO intake, Supplement acceptance  REASON FOR ASSESSMENT:   Consult Assessment of nutrition requirement/status  ASSESSMENT:   Pt with medical history significant of hypertension, BPH with urinary retention, previous fall with SDH, insomnia on Xanax, CKD stage IIIa who presents due to a syncope.  Pt admitted with syncope.   Reviewed I/O's: +800 ml x 24 hours   UOP: 200 ml x 24 hours   Spoke with pt and daughter at bedside. Pt reports feeling better today. He resides in a ALF where he receives one per day in the dining room. Pt has a good appetite and "eats like a horse" (Breakfast: grits, eggs, sausage, Lunch: meat,  starch, and vegetable; Dinner: salad or sandwich). Pt also snacks on potato chips, cookies, and crackers.   Pt consumed about 50% of his lunch meal. He has difficulty chewing meat secondary to not having access to his dentures.   Pt is active, uses a walker to ambulate. Pt shares that he was lifting weights up until 2 weeks ago secondary to weakness.   No wt loss noted in the past 5 months. Pt estimated he has lost 5-6# within the past 2 weeks due to not eating well.   Discussed importance of good meal and supplement intake to promote healing. Pt amenable to Ensure and diet downgrade. Provided pt with strawberry Ensure per his request.   Medications reviewed and include lokelma and 0.9% sodium  chloride infusion @ 100 ml/hr.   Labs reviewed: Na: 134, K: 5.7. Mg: 2.5.    NUTRITION - FOCUSED PHYSICAL EXAM:  Flowsheet Row Most Recent Value  Orbital Region Severe depletion  Upper Arm Region Severe depletion  Thoracic and Lumbar Region Severe depletion  Buccal Region Severe depletion  Temple Region Severe depletion  Clavicle Bone Region Severe depletion  Clavicle and Acromion Bone Region Severe depletion  Scapular Bone Region Severe depletion  Dorsal Hand Severe depletion  Patellar Region Severe depletion  Anterior Thigh Region Severe depletion  Posterior Calf Region Severe depletion  Edema (RD Assessment) None  Hair Reviewed  Eyes Reviewed  Mouth Reviewed  Skin Reviewed  Nails Reviewed       Diet Order:   Diet Order             Diet regular Room service appropriate? Yes; Fluid consistency: Thin  Diet effective now                   EDUCATION NEEDS:   Education needs have been addressed  Skin:  Skin Assessment: Skin Integrity Issues: Skin Integrity Issues:: Other (Comment) Other: skin to upper back and rt pretibial  Last BM:  11/20/22  Height:   Ht Readings from Last 1 Encounters:  11/20/22 6' (1.829 m)    Weight:   Wt Readings from Last 1 Encounters:  11/20/22 62.9 kg    Ideal Body Weight:  80.9 kg  BMI:  Body mass index is 18.8 kg/m.  Estimated Nutritional Needs:   Kcal:  1850-2050  Protein:  90-105 grams  Fluid:  > 1.8 L    Levada Schilling, RD, LDN, CDCES Registered Dietitian II Certified Diabetes Care and Education Specialist Please refer to [facility] for RD and/or RD on-call/weekend/after hours pager

## 2022-11-21 NOTE — Evaluation (Signed)
Physical Therapy Evaluation Patient Details Name: Jacob Moses MRN: [id number] DOB: 1927-10-02 Today's Date: 11/21/2022  History of Present Illness  Pt is a 89 y/o who presents after a syncopal event and  fall at home, mild rhabdomyolysis, possible aspiration pneumonitis and suspected a-flutter, CT imaging reveals aneurym on aortic arch and thoracic aorta, regular follow-up imaging recommended. PMH significant for HOH, HTN, insomnia, hx of stomach ulcers, vertigo  Clinical Impression  Pt in bed, oriented to self, month, place, denies any pain, family present. PT/OT co-evaluation to maximize pt and clinician safety. PTA pt was modI using a RW, and modI for most ADL's, lives in ILF. Pt performed supine>sit minA for trunk control and LE facilitation, transfers c/ minA+2 second assist managing lines/leads, ambulation modA+2 for steadying, sequencing and line management. Pt sidestepped 5ft from EOB>chair c/ RW modA+2. BP seated EOB prior to OOB mobility, 132/108. Pt reported mild dizziness in standing, pt began reaching for surface, PT sat pt EOB, resolved <36min. Pt left in chair, needs within, reach alarm set. Pt would benefit from acute therapy services to improve mobility and functional activity tolerance.       Assistance Recommended at Discharge    If plan is discharge home, recommend the following:  Can travel by private vehicle  A lot of help with walking and/or transfers;Assistance with cooking/housework;Assist for transportation;Help with stairs or ramp for entrance;A lot of help with bathing/dressing/bathroom;Direct supervision/assist for medications management   No    Equipment Recommendations Other (comment) (TBD next venue)  Recommendations for Other Services       Functional Status Assessment Patient has had a recent decline in their functional status and demonstrates the ability to make significant improvements in function in a reasonable and predictable amount of time.      Precautions / Restrictions Precautions Precautions: Fall Restrictions Weight Bearing Restrictions: No      Mobility  Bed Mobility Overal bed mobility: Needs Assistance Bed Mobility: Supine to Sit     Supine to sit: Min assist     General bed mobility comments: minA for trunk control and LE facilitation but pt able to initiate    Transfers Overall transfer level: Needs assistance Equipment used: Rolling walker (2 wheels) Transfers: Sit to/from Stand, Bed to chair/wheelchair/BSC Sit to Stand: Min assist, +2 safety/equipment Stand pivot transfers: Min assist, +2 safety/equipment         General transfer comment: SPT EOB<>BSC c/ minA+2 for lines/leads/AD navigation    Ambulation/Gait Ambulation/Gait assistance: +2 safety/equipment, Mod assist Gait Distance (Feet): 3 Feet Assistive device: Rolling walker (2 wheels) Gait Pattern/deviations: Step-to pattern       General Gait Details: sidestepping from EOB>BSC c/ RW modA+2 for AD navigation and steadying, second assist for lines/leads,  Stairs            Wheelchair Mobility     Tilt Bed    Modified Rankin (Stroke Patients Only)       Balance Overall balance assessment: Needs assistance Sitting-balance support: Feet unsupported Sitting balance-Leahy Scale: Good Sitting balance - Comments: able to sit EOB c/o assistance     Standing balance-Leahy Scale: Fair Standing balance comment: pt able to remove single UE to perform cleaning in standing at RW                             Pertinent Vitals/Pain Pain Assessment Pain Assessment: No/denies pain    Home Living Family/patient expects to be discharged to:: Other (  Comment) (ILF)                 Home Equipment: Rolling Walker (2 wheels)      Prior Function Prior Level of Function : Needs assist       Physical Assist : ADLs (physical)   ADLs (physical): IADLs Mobility Comments: RW at baseline, pt walks to cafeteria approx  100 yards up hill ADLs Comments: generally MOD I in ADL, assist for IADL as needed, pt amb around grocery store with daugther     Hand Dominance   Dominant Hand: Right    Extremity/Trunk Assessment   Upper Extremity Assessment Upper Extremity Assessment: Generalized weakness    Lower Extremity Assessment Lower Extremity Assessment: Generalized weakness       Communication   Communication: HOH  Cognition Arousal/Alertness: Awake/alert Behavior During Therapy: Anxious Overall Cognitive Status: Within Functional Limits for tasks assessed                                 General Comments: oriented to self, place, month        General Comments General comments (skin integrity, edema, etc.): vss monitored throughout, appear stable;    Exercises Total Joint Exercises Ankle Circles/Pumps: AROM, Both, 10 reps, Supine Straight Leg Raises: AROM, Both, 5 reps, Supine   Assessment/Plan    PT Assessment Patient needs continued PT services  PT Problem List Decreased knowledge of use of DME;Decreased strength;Decreased range of motion;Decreased safety awareness;Decreased activity tolerance;Decreased balance;Decreased mobility;Decreased coordination;Decreased cognition       PT Treatment Interventions DME instruction;Neuromuscular re-education;Gait training;Stair training;Patient/family education;Functional mobility training;Therapeutic activities;Therapeutic exercise;Balance training    PT Goals (Current goals can be found in the Care Plan section)  Acute Rehab PT Goals Patient Stated Goal: to return to home PT Goal Formulation: With patient Time For Goal Achievement: 12/05/22 Potential to Achieve Goals: Good    Frequency Min 1X/week     Co-evaluation   Reason for Co-Treatment: To address functional/ADL transfers;Complexity of the patient's impairments (multi-system involvement);For patient/therapist safety   OT goals addressed during session: ADL's and  self-care       AM-PAC PT "6 Clicks" Mobility  Outcome Measure Help needed turning from your back to your side while in a flat bed without using bedrails?: A Little Help needed moving from lying on your back to sitting on the side of a flat bed without using bedrails?: A Little Help needed moving to and from a bed to a chair (including a wheelchair)?: A Little Help needed standing up from a chair using your arms (e.g., wheelchair or bedside chair)?: A Little Help needed to walk in hospital room?: A Little Help needed climbing 3-5 steps with a railing? : A Lot 6 Click Score: 17    End of Session Equipment Utilized During Treatment: Gait belt Activity Tolerance: Patient limited by fatigue Patient left: in chair;with call bell/phone within reach;with chair alarm set Nurse Communication: Mobility status PT Visit Diagnosis: Unsteadiness on feet (R26.81);Muscle weakness (generalized) (M62.81);History of falling (Z91.81)    Time: 1040-1058 PT Time Calculation (min) (ACUTE ONLY): 18 min   Charges:   PT Evaluation $PT Eval Moderate Complexity: 1 Mod   PT General Charges $$ ACUTE PT VISIT: 1 Visit       Lala Lund, PT, SPT  3:36 PM,11/21/22

## 2022-11-21 NOTE — [place] FL2 (Signed)
[place] MEDICAID FL2 LEVEL OF CARE FORM     IDENTIFICATION  Patient Name: Jacob Moses Birthdate: 06-04-1927 Sex: male Admission Date (Current Location): 11/20/2022  [facility] and [place] Number:  Chiropodist and Address:  [facility], [address], [place], [place] [address]      Provider Number: [id number]  Attending Physician Name and Address:  Darlin Priestly, MD  Relative Name and Phone Number:  Lupita Leash  [phone]    Current Level of Care: Hospital Recommended Level of Care: Skilled Nursing Facility Prior Approval Number:    Date Approved/Denied:   PASRR Number:    Discharge Plan: SNF    Current Diagnoses: Patient Active Problem List   Diagnosis Date Noted   Hyperkalemia 11/20/2022   Syncope 11/20/2022   Rhabdomyolysis 11/20/2022   Opacity of lung on imaging study 11/20/2022   Benign prostatic hyperplasia with lower urinary tract symptoms 01/01/2021   Sepsis due to gram-negative UTI (HCC) 12/14/2020   Sepsis (HCC) 12/14/2020   Bladder outlet obstruction 12/04/2020   Hyponatremia 12/03/2020   Closed right hip fracture (HCC) 11/29/2020   Fall at home, initial encounter 11/29/2020   Bradycardia 11/29/2020   HTN (hypertension) 11/29/2020   GERD (gastroesophageal reflux disease) 11/29/2020   Anxiety 11/29/2020   CKD (chronic kidney disease), stage IIIb 11/29/2020   Iron deficiency anemia 11/29/2020   Age-related osteoporosis without current pathological fracture 05/05/2020   Nondependent alcohol abuse, in remission 05/05/2020   Moderate aortic insufficiency 05/18/2019   Peripheral edema 04/06/2019   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) 02/28/2019   Insomnia, unspecified 02/25/2019   Protein-calorie malnutrition, severe 02/24/2019   SDH (subdural hematoma) (HCC) 02/22/2019    Orientation RESPIRATION BLADDER Height & Weight     Self, Place, Situation  Normal Incontinent, External catheter Weight: 138 lb 9.6  oz (62.9 kg) Height:  6' (182.9 cm)  BEHAVIORAL SYMPTOMS/MOOD NEUROLOGICAL BOWEL NUTRITION STATUS      Continent Diet (see discharge summary)  AMBULATORY STATUS COMMUNICATION OF NEEDS Skin   Limited Assist Verbally Skin abrasions (abrasion leg, skin tear back)                       Personal Care Assistance Level of Assistance  Bathing, Feeding, Dressing, Total care Bathing Assistance: Limited assistance Feeding assistance: Independent Dressing Assistance: Limited assistance Total Care Assistance: Limited assistance   Functional Limitations Info  Sight, Hearing, Speech Sight Info: Adequate Hearing Info: Impaired Speech Info: Adequate    SPECIAL CARE FACTORS FREQUENCY  PT (By licensed PT), OT (By licensed OT)     PT Frequency: min 4x weekly OT Frequency: min 4x weekly            Contractures Contractures Info: Not present    Additional Factors Info  Code Status, Allergies Code Status Info: DNR Allergies Info: NKA           Current Medications (11/21/2022):  This is the current hospital active medication list Current Facility-Administered Medications  Medication Dose Route Frequency Provider Last Rate Last Admin   0.9 %  sodium chloride infusion   Intravenous Continuous Darlin Priestly, MD 100 mL/hr at 11/21/22 1023 New Bag at 11/21/22 1023   acetaminophen (TYLENOL) tablet 650 mg  650 mg Oral Q6H PRN Verdene Lennert, MD   650 mg at 11/21/22 1325   Or   acetaminophen (TYLENOL) suppository 650 mg  650 mg Rectal Q6H PRN Verdene Lennert, MD  ALPRAZolam Prudy Feeler) tablet 0.25 mg  0.25 mg Oral QHS PRN Verdene Lennert, MD   0.25 mg at 11/20/22 2031   dextrose 50 % solution 50 mL  1 ampule Intravenous PRN Verdene Lennert, MD       enoxaparin (LOVENOX) injection 30 mg  30 mg Subcutaneous Q24H Verdene Lennert, MD   30 mg at 11/20/22 2031   feeding supplement (ENSURE ENLIVE / ENSURE PLUS) liquid 237 mL  237 mL Oral TID BM Darlin Priestly, MD       multivitamin with minerals tablet 1  tablet  1 tablet Oral Daily Darlin Priestly, MD       ondansetron [facility]) tablet 4 mg  4 mg Oral Q6H PRN Verdene Lennert, MD       Or   ondansetron (ZOFRAN) injection 4 mg  4 mg Intravenous Q6H PRN Verdene Lennert, MD       polyethylene glycol (MIRALAX / GLYCOLAX) packet 17 g  17 g Oral Daily PRN Verdene Lennert, MD       sodium chloride flush (NS) 0.9 % injection 3 mL  3 mL Intravenous Q12H Verdene Lennert, MD   3 mL at 11/21/22 1022   sodium zirconium cyclosilicate (LOKELMA) packet 10 g  10 g Oral BID Darlin Priestly, MD   10 g at 11/21/22 1033     Discharge Medications: Please see discharge summary for a list of discharge medications.  Relevant Imaging Results:  Relevant Lab Results:   Additional Information SSN: [id number]  Darolyn Rua, LCSW

## 2022-11-21 NOTE — Progress Notes (Signed)
PROGRESS NOTE    Jacob Moses  HYQ:[id number] DOB: 28-Sep-1927 DOA: 11/20/2022 PCP: Jaclyn Shaggy, MD  [id number]  LOS: 0 days   Brief hospital course:   Assessment & Plan: Jacob Moses is a 89 y.o. male with medical history significant of hypertension, BPH with urinary retention, previous fall with SDH, insomnia on Xanax, CKD stage IIIa who presents to the ED due to a fall.  Per ED provider, pt reported that he fell and spent the night on the floor. EMS reported that they did find him on the floor after nursing staff checked on him in the morning. Patient denied any head injury or loss of consciousness but stated "my lights went out". EMS reported that his walker was not near him where they found him.    * Questionable Syncope --unclear hx from the pt. - Telemetry monitoring given bradycardia --cardiology consulted  Weakness and fall --PT eval, rec SNF rehab  Hyperkalemia Patient has a history of hyperkalemia in the past.  Uncertain etiology at this time as patient's renal dysfunction is not so severe.  Rhabdomyolysis, however this is also mild.  He is not on any medications that can cause this.   --received 2 doses of Lokelma 10g with improvement Plan: --Lokelma 10 mg x2 today  Bradycardia History of sinus bradycardia, with current rates in the 50s. --cardiology consulted  HTN (hypertension) Patient was significantly hypertensive on presentation, likely due to self discontinuation of home antihypertensives.   --resume home amlodipine at increased 10 mg daily --IV hydralazine PRN  CKD (chronic kidney disease), stage IIIb Patient has a history of CKD, initially stage IIIa, however currently seems stage IIIb.  He has previously refused follow-up with nephrology.  Will continue to monitor while admitted  Benign prostatic hyperplasia with lower urinary tract symptoms --not taking home finasteride  Rhabdomyolysis, mild --CK 664. - S/p 1.25 L of normal saline --cont  NS@100  for 10 hours  Anxiety Patient is on Xanax 0.5 mg daily at home for insomnia and anxiety.  this may have contributed to his ground-level fall.   --cont Xanax at lower dose of 0.25 mg nightly  Hx of SDH (subdural hematoma) (HCC) Stable on CT imaging.  No further interventions at this point.  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    DVT prophylaxis: Lovenox SQ Code Status: DNR  Family Communication: daughter updated at bedside today Level of care: Med-Surg Dispo:   The patient is from: home Anticipated d/c is to: SNF rehab   Subjective and Interval History:  Pt complained of not feeling well.  Reported feeling dizzy.  Couldn't elaborate more.   Objective: Vitals:   11/21/22 0418 11/21/22 0700 11/21/22 1150 11/21/22 1711  BP: (!) 153/73 (!) 169/69 (!) 168/75 (!) 181/78  Pulse: (!) 53 (!) 48 (!) 50 (!) 52  Resp:   18 16  Temp: 97.9 F (36.6 C) (!) 97.3 F (36.3 C) (!) 97 F (36.1 C) 97.8 F (36.6 C)  TempSrc: Oral Oral    SpO2: 98% 100% 100% 99%  Weight:      Height:        Intake/Output Summary (Last 24 hours) at 11/21/2022 1756 Last data filed at 11/21/2022 1706 Gross per 24 hour  Intake --  Output 425 ml  Net -425 ml   Filed Weights   11/20/22 0730 11/20/22 1542  Weight: 58.1 kg 62.9 kg    Examination:   Constitutional: NAD, AAOx3, temporal  wasting  HEENT: conjunctivae and lids normal, EOMI CV: No cyanosis.   RESP: normal respiratory effort, on RA Neuro: II - XII grossly intact.     Data Reviewed: I have personally reviewed labs and imaging studies  Time spent: 50 minutes  Darlin Priestly, MD [facility] If 7PM-7AM, please contact night-coverage 11/21/2022, 5:56 PM

## 2022-11-21 NOTE — TOC Initial Note (Addendum)
Transition of Care [facility]) - Initial/Assessment Note    Patient Details  Name: Jacob Moses MRN: [id number] Date of Birth: February 22, 1928  [facility]) CM/SW Contact:    Darolyn Rua, LCSW Phone Number: 11/21/2022, 4:04 PM  Clinical Narrative:                  CSW spoke with daughter Lupita Leash who reports patient is from [facility] living facility, patient and Lupita Leash are in agreement with SNF. Reports hx of [facility], would not like to return to [facility] again if possible .   Referrals sent out pending bed offers at this time.   Expected Discharge Plan: Skilled Nursing Facility Barriers to Discharge: Continued Medical Work up   Patient Goals and CMS Choice Patient states their goals for this hospitalization and ongoing recovery are:: to go home CMS Medicare.gov Compare Post Acute Care list provided to:: Patient Represenative (must comment) (daughter)        Expected Discharge Plan and Services       Living arrangements for the past 2 months: Single Family Home                                      Prior Living Arrangements/Services Living arrangements for the past 2 months: Single Family Home Lives with:: Self                   Activities of Daily Living Home Assistive Devices/Equipment: Environmental consultant (specify type), Shower chair without back, Blood pressure cuff ADL Screening (condition at time of admission) Patient's cognitive ability adequate to safely complete daily activities?: Yes Is the patient deaf or have difficulty hearing?: Yes Does the patient have difficulty seeing, even when wearing glasses/contacts?: Yes Does the patient have difficulty concentrating, remembering, or making decisions?: Yes Patient able to express need for assistance with ADLs?: Yes Does the patient have difficulty dressing or bathing?: No Independently performs ADLs?: Yes (appropriate for developmental age) Communication: Independent Dressing  (OT): Independent Grooming: Independent Feeding: Independent Bathing: Independent Toileting: Independent In/Out Bed: Independent Walks in Home: Independent Does the patient have difficulty walking or climbing stairs?: No Weakness of Legs: Both Weakness of Arms/Hands: Both  Permission Sought/Granted                  Emotional Assessment              Admission diagnosis:  Syncope [R55] Elevated troponin [R79.89] Medically noncompliant [Z91.199] Hypertension, uncontrolled [I10] Fall in home, initial encounter [W19.XXXA, Y92.009] Traumatic rhabdomyolysis, initial encounter (HCC) [T79.6XXA] Hyperkalemia [E87.5] Patient Active Problem List   Diagnosis Date Noted   Hyperkalemia 11/20/2022   Syncope 11/20/2022   Rhabdomyolysis 11/20/2022   Opacity of lung on imaging study 11/20/2022   Benign prostatic hyperplasia with lower urinary tract symptoms 01/01/2021   Sepsis due to gram-negative UTI (HCC) 12/14/2020   Sepsis (HCC) 12/14/2020   Bladder outlet obstruction 12/04/2020   Hyponatremia 12/03/2020   Closed right hip fracture (HCC) 11/29/2020   Fall at home, initial encounter 11/29/2020   Bradycardia 11/29/2020   HTN (hypertension) 11/29/2020   GERD (gastroesophageal reflux disease) 11/29/2020   Anxiety 11/29/2020   CKD (chronic kidney disease), stage IIIb 11/29/2020   Iron deficiency anemia 11/29/2020   Age-related osteoporosis without current pathological fracture 05/05/2020   Nondependent alcohol abuse, in remission 05/05/2020   Moderate aortic insufficiency 05/18/2019   Peripheral  edema 04/06/2019   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 [place] [address] Phone: [phone] Fax:  [phone]     Social Determinants of Health (SDOH) Social History: SDOH Screenings   Food Insecurity: No Food Insecurity (11/20/2022)  Housing: Patient Declined (11/20/2022)  Transportation Needs: No Transportation Needs (11/20/2022)  Utilities: Patient Declined (11/20/2022)  Tobacco Use: Medium Risk (11/20/2022)   SDOH Interventions:     Readmission Risk Interventions     No data to display

## 2022-11-21 NOTE — Evaluation (Signed)
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 PMH significant for HOH, HTN, insomnia, hx of stomach ulcers, vertigo   Clinical Impression   Chart reviewed, MD cleared pt for participation in OT evaluation. Co tx completed with PT on this date. Pt daughter in the room throughout evaluation.PTA pt lives in [facility]), is generally MOD I for ADL, assist for IADL as needed, amb with RW. Pt presents with deficits in strength, endurance ,activity tolerance, balance, all affecting safe and optimal ADL completion. Pt endorses not feeling well throughout, fatigue limiting furthering mobility, vitals monitored and appear stable. Pt will benefit from skilled OT to address functional deficits and to facilitate return to PLOF.      Recommendations for follow up therapy are one component of a multi-disciplinary discharge planning process, led by the attending physician.  Recommendations may be updated based on patient status, additional functional criteria and insurance authorization.   Assistance Recommended at Discharge Intermittent Supervision/Assistance  Patient can return home with the following A little help with walking and/or transfers;A little help with bathing/dressing/bathroom;Direct supervision/assist for medications management;Assist for transportation;Help with stairs or ramp for entrance    Functional Status Assessment  Patient has had a recent decline in their functional status and demonstrates the ability to make significant improvements in function in a reasonable and predictable amount of time.  Equipment Recommendations  Other  (comment) (per next venue of care)    Recommendations for Other Services       Precautions / Restrictions Precautions Precautions: Fall Restrictions Weight Bearing Restrictions: No      Mobility Bed Mobility Overal bed mobility: Needs Assistance Bed Mobility: Supine to Sit     Supine to sit: Min assist          Transfers Overall transfer level: Needs assistance Equipment used: Rolling walker (2 wheels) Transfers: Sit to/from Stand, Bed to chair/wheelchair/BSC Sit to Stand: Min assist, +2 safety/equipment                  Balance Overall balance assessment: Needs assistance Sitting-balance support: Feet unsupported Sitting balance-Leahy Scale: Good     Standing balance support: Bilateral upper extremity supported, During functional activity, Reliant on assistive device for balance Standing balance-Leahy Scale: Fair                             ADL either performed or assessed with clinical judgement   ADL Overall ADL's : Needs assistance/impaired Eating/Feeding: Set up;Sitting   Grooming: Wash/dry hands;Sitting;Set up               Lower Body Dressing: Maximal assistance Lower Body Dressing Details (indicate cue type and reason): anticipated Toilet Transfer: Minimal assistance;+2 for safety/equipment;Rolling walker (2 wheels);BSC/3in1   Toileting- Clothing Manipulation and Hygiene: Minimal assistance;Sit to/from stand       Functional mobility during ADLs: Minimal assistance;+2 for safety/equipment (short amb transfer)       Vision Patient Visual Report: No change from baseline       Perception     Praxis      Pertinent Vitals/Pain Pain Assessment Pain Assessment: No/denies pain     Hand Dominance  Right   Extremity/Trunk Assessment Upper Extremity Assessment Upper Extremity Assessment: Generalized weakness   Lower Extremity Assessment Lower Extremity Assessment: Generalized weakness       Communication  Communication Communication: HOH   Cognition Arousal/Alertness: Awake/alert Behavior During Therapy: WFL for tasks assessed/performed Overall Cognitive Status: Within Functional Limits for tasks assessed                                       General Comments  vss monitored throughout, appear stable;    Exercises     Shoulder Instructions      Home Living Family/patient expects to be discharged to:: Other (Comment) ([facility])                             Home Equipment: Rolling Walker (2 wheels)          Prior Functioning/Environment Prior Level of Function : Needs assist       Physical Assist : ADLs (physical)   ADLs (physical): IADLs Mobility Comments: RW at baseline, pt walks to cafeteria approx 100 yards up hill ADLs Comments: generally MOD I in ADL, assist for IADL as needed, pt amb around grocery store with daugther        OT Problem List: Decreased strength;Decreased activity tolerance;Impaired balance (sitting and/or standing);Decreased knowledge of use of DME or AE;Cardiopulmonary status limiting activity      OT Treatment/Interventions: Self-care/ADL training;Energy conservation;Balance training;Therapeutic exercise;DME and/or AE instruction;Neuromuscular education;Therapeutic activities;Patient/family education    OT Goals(Current goals can be found in the care plan section) Acute Rehab OT Goals Patient Stated Goal: go home OT Goal Formulation: With patient Time For Goal Achievement: 12/05/22 Potential to Achieve Goals: Fair ADL Goals Pt Will Perform Grooming: with modified independence Pt Will Perform Lower Body Dressing: with modified independence Pt Will Transfer to Toilet: with modified independence Pt Will Perform Toileting - Clothing Manipulation and hygiene: with modified independence  OT Frequency: Min 1X/week    Co-evaluation PT/OT/SLP Co-Evaluation/Treatment: Yes Reason for Co-Treatment: To address functional/ADL  transfers;Complexity of the patient's impairments (multi-system involvement);For patient/therapist safety PT goals addressed during session: Mobility/safety with mobility;Proper use of DME;Balance OT goals addressed during session: ADL's and self-care      AM-PAC OT "6 Clicks" Daily Activity     Outcome Measure Help from another person eating meals?: None Help from another person taking care of personal grooming?: A Little Help from another person toileting, which includes using toliet, bedpan, or urinal?: A Little Help from another person bathing (including washing, rinsing, drying)?: A Lot Help from another person to put on and taking off regular upper body clothing?: A Little Help from another person to put on and taking off regular lower body clothing?: A Lot 6 Click Score: 17   End of Session Equipment Utilized During Treatment: Rolling walker (2 wheels);Gait belt Nurse Communication: Mobility status (stable vital signs)  Activity Tolerance: Patient tolerated treatment well Patient left: in chair;with call bell/phone within reach;with chair alarm set  OT Visit Diagnosis: Muscle weakness (generalized) (M62.81);Other abnormalities of gait and mobility (R26.89)                Time: 1040-1108 OT Time Calculation (min): 28 min Charges:  OT General Charges $OT Visit: 1 Visit OT Evaluation $OT Eval Moderate Complexity: 1 Mod  Oleta Mouse, OTD OTR/L  11/21/22, 1:24 PM

## 2022-11-21 NOTE — Progress Notes (Signed)
*  PRELIMINARY RESULTS* Echocardiogram 2D Echocardiogram has been performed.  Cristela Blue 11/21/2022, 9:19 AM

## 2022-11-22 DIAGNOSIS — E875 Hyperkalemia: Secondary | ICD-10-CM | POA: Diagnosis not present

## 2022-11-22 LAB — BASIC METABOLIC PANEL
Anion gap: 7 (ref 5–15)
BUN: 32 mg/dL — ABNORMAL HIGH (ref 8–23)
CO2: 24 mmol/L (ref 22–32)
Calcium: 8.7 mg/dL — ABNORMAL LOW (ref 8.9–10.3)
Chloride: 102 mmol/L (ref 98–111)
Creatinine, Ser: 1.4 mg/dL — ABNORMAL HIGH (ref 0.61–1.24)
GFR, Estimated: 47 mL/min — ABNORMAL LOW (ref >60)
Glucose, Bld: 118 mg/dL — ABNORMAL HIGH (ref 70–99)
Potassium: 4.3 mmol/L (ref 3.5–5.1)
Sodium: 133 mmol/L — ABNORMAL LOW (ref 135–145)

## 2022-11-22 LAB — CBC
HCT: 38.4 % — ABNORMAL LOW (ref 39.0–52.0)
Hemoglobin: 12.9 g/dL — ABNORMAL LOW (ref 13.0–17.0)
MCH: 31.9 pg (ref 26.0–34.0)
MCHC: 33.6 g/dL (ref 30.0–36.0)
MCV: 94.8 fL (ref 80.0–100.0)
Platelets: 226 10*3/uL (ref 150–400)
RBC: 4.05 MIL/uL — ABNORMAL LOW (ref 4.22–5.81)
RDW: 12.1 % (ref 11.5–15.5)
WBC: 10.4 10*3/uL (ref 4.0–10.5)
nRBC: 0 % (ref 0.0–0.2)

## 2022-11-22 LAB — GLUCOSE, CAPILLARY: Glucose-Capillary: 115 mg/dL — ABNORMAL HIGH (ref 70–99)

## 2022-11-22 LAB — MAGNESIUM: Magnesium: 2.5 mg/dL — ABNORMAL HIGH (ref 1.7–2.4)

## 2022-11-22 MED ORDER — BUTALBITAL-APAP-CAFFEINE 50-325-40 MG PO TABS
2.0000 | ORAL_TABLET | Freq: Once | ORAL | Status: AC
  Administered 2022-11-22: 2 via ORAL
  Filled 2022-11-22: qty 2

## 2022-11-22 NOTE — Progress Notes (Signed)
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 PMH significant for HOH, HTN, insomnia, hx of stomach ulcers, vertigo   OT comments  Pt seen for OT tx. Daughter present and supportive throughout. Pt denies pain or complaints. Pt required MIN A for bed mobility, stood with VC for hand placement and initial anterior shift towards EOB, with mild initial posterior lean but able to correct with CGA and VC. VC for RW mgt. After a few steps pt endorsed feeling tired and requested to sit. BP 130/78 but pt did endorse mild dizziness that improved with time seated. RN notified. Pt continues to benefit from skilled OT services. Continue with POC.     Recommendations for follow up therapy are one component of a multi-disciplinary discharge planning process, led by the attending physician.  Recommendations may be updated based on patient status, additional functional criteria and insurance authorization.    Assistance Recommended at Discharge Intermittent Supervision/Assistance  Patient can return home with the following  A little help with walking and/or transfers;A little help with bathing/dressing/bathroom;Direct supervision/assist for medications management;Assist for transportation;Help with stairs or ramp for entrance   Equipment Recommendations  Other (comment) (per next venue of care)    Recommendations for Other Services      Precautions / Restrictions Precautions Precautions: Fall Restrictions Weight Bearing Restrictions: No       Mobility Bed Mobility Overal bed mobility: Needs Assistance Bed Mobility: Supine to Sit     Supine to sit:  Min assist          Transfers Overall transfer level: Needs assistance Equipment used: Rolling walker (2 wheels) Transfers: Sit to/from Stand, Bed to chair/wheelchair/BSC Sit to Stand: Min assist     Step pivot transfers: Min guard     General transfer comment: VC for sequencing with RW and hand placement     Balance Overall balance assessment: Needs assistance Sitting-balance support: Feet supported Sitting balance-Leahy Scale: Good     Standing balance support: Bilateral upper extremity supported, During functional activity, Reliant on assistive device for balance Standing balance-Leahy Scale: Fair                             ADL either performed or assessed with clinical judgement   ADL Overall ADL's : Needs assistance/impaired                     Lower Body Dressing: Maximal assistance;Sitting/lateral leans                      Extremity/Trunk Assessment              Vision       Perception     Praxis      Cognition Arousal/Alertness: Awake/alert Behavior During Therapy: WFL for tasks assessed/performed Overall Cognitive Status: Within Functional Limits for tasks assessed                                          Exercises      Shoulder Instructions  General Comments VSS, pt notes mild dizziness once in the recliner that eases with time. BP in sitting 130/78, RN in at end of session and notified.    Pertinent Vitals/ Pain       Pain Assessment Pain Assessment: No/denies pain  Home Living                                          Prior Functioning/Environment              Frequency  Min 1X/week        Progress Toward Goals  OT Goals(current goals can now be found in the care plan section)  Progress towards OT goals: Progressing toward goals  Acute Rehab OT Goals Patient Stated Goal: go home OT Goal Formulation: With patient Time For Goal Achievement:  12/05/22 Potential to Achieve Goals: Fair  Plan Discharge plan remains appropriate;Frequency remains appropriate    Co-evaluation                 AM-PAC OT "6 Clicks" Daily Activity     Outcome Measure   Help from another person eating meals?: None Help from another person taking care of personal grooming?: A Little Help from another person toileting, which includes using toliet, bedpan, or urinal?: A Little Help from another person bathing (including washing, rinsing, drying)?: A Lot Help from another person to put on and taking off regular upper body clothing?: A Little Help from another person to put on and taking off regular lower body clothing?: A Lot 6 Click Score: 17    End of Session Equipment Utilized During Treatment: Gait belt;Rolling walker (2 wheels)  OT Visit Diagnosis: Muscle weakness (generalized) (M62.81);Other abnormalities of gait and mobility (R26.89)   Activity Tolerance Patient tolerated treatment well   Patient Left in chair;with call bell/phone within reach;with chair alarm set   Nurse Communication Mobility status (RN, NTs, and mobility specialist)        Time: 4132-4401 OT Time Calculation (min): 19 min  Charges: OT General Charges $OT Visit: 1 Visit OT Treatments $Self Care/Home Management : 8-22 mins  Arman Filter., MPH, MS, OTR/L ascom [phone] 11/22/22, 1:15 PM

## 2022-11-22 NOTE — TOC Progression Note (Signed)
Transition of Care [facility]) - Progression Note    Patient Details  Name: Jacob Moses MRN: [id number] Date of Birth: 1927/08/02  [facility]) CM/SW Contact  Bing Quarry, RN Phone Number: 11/22/2022, 12:13 PM  Clinical Narrative:  7/20: Prior PASRR obtained from [facility] MUST today.  # [id number] A from 02/24/2019. Attached as memo and sent to bed searches still pending.  Gabriel Cirri MSN RN CM  Transitions of Care Department [facility] [phone] Weekends Only     Expected Discharge Plan: Skilled Nursing Facility Barriers to Discharge: Continued Medical Work up  Expected Discharge Plan and Services       Living arrangements for the past 2 months: Single Family Home                                       Social Determinants of Health (SDOH) Interventions SDOH Screenings   Food Insecurity: No Food Insecurity (11/20/2022)  Housing: Patient Declined (11/20/2022)  Transportation Needs: No Transportation Needs (11/20/2022)  Utilities: Patient Declined (11/20/2022)  Tobacco Use: Medium Risk (11/20/2022)    Readmission Risk Interventions     No data to display

## 2022-11-22 NOTE — Progress Notes (Signed)
Mobility Specialist - Progress Note   11/22/22 1114  Mobility  Activity Ambulated with assistance in room;Transferred from chair to bed  Level of Assistance Contact guard assist, steadying assist  Assistive Device Front wheel walker  Distance Ambulated (ft) 3 ft  Activity Response Tolerated well  Mobility Referral Yes  $Mobility charge 1 Mobility  Mobility Specialist Start Time (ACUTE ONLY) 1047  Mobility Specialist Stop Time (ACUTE ONLY) 1108  Mobility Specialist Time Calculation (min) (ACUTE ONLY) 21 min   Pt sitting in recliner on RA upon arrival. Pt STS and ambulates from chair to bed CGA with no LOB noted. Pt left supine in bed with needs in reach, bed alarm activated, and family present.   Terrilyn Saver  Mobility Specialist  11/22/22 11:16 AM

## 2022-11-22 NOTE — Progress Notes (Signed)
[facility]    SUBJECTIVE: Patient resting comfortably denies any lightheaded dizziness still has persistent recurrent vertigo and dizziness unclear etiology no chest pain.  Patient lying in bed has not ambulated much except with physical therapy   Vitals:   11/21/22 1942 11/22/22 0003 11/22/22 0500 11/22/22 0527  BP: (!) 184/87 (!) 144/69  (!) 172/78  Pulse: (!) 57 61  (!) 52  Resp: 16 16  16   Temp: 98.3 F (36.8 C) 98.2 F (36.8 C)  (!) 97.5 F (36.4 C)  TempSrc: Oral     SpO2: 100% 97%  99%  Weight:   61.5 kg   Height:         Intake/Output Summary (Last 24 hours) at 11/22/2022 1151 Last data filed at 11/22/2022 0500 Gross per 24 hour  Intake 123 ml  Output 2225 ml  Net -2102 ml      PHYSICAL EXAM  General: Well developed, well nourished, in no acute distress HEENT:  Normocephalic and atramatic Neck:  No JVD.  Lungs: Clear bilaterally to auscultation and percussion. Heart: HRRR . Normal S1 and S2 without gallops or murmurs.  Abdomen: Bowel sounds are positive, abdomen soft and non-tender  Msk:  Back normal, normal gait. Normal strength and tone for age. Extremities: No clubbing, cyanosis or edema.   Neuro: Alert and oriented X 3. Psych:  Good affect, responds appropriately   LABS: Basic Metabolic Panel: Recent Labs    11/21/22 0859 11/22/22 0453  NA 134* 133*  K 5.7* 4.3  CL 101 102  CO2 24 24  GLUCOSE 100* 118*  BUN 35* 32*  CREATININE 1.69* 1.40*  CALCIUM 8.7* 8.7*  MG  --  2.5*   Liver Function Tests: Recent Labs    11/20/22 0732  AST 30  ALT 17  ALKPHOS 77  BILITOT 0.6  PROT 7.6  ALBUMIN 4.3   No results for input(s): "LIPASE", "AMYLASE" in the last 72 hours. CBC: Recent Labs    11/20/22 0732 11/21/22 0859 11/22/22 0453  WBC 9.5 8.2 10.4  NEUTROABS 7.0  --   --   HGB 12.7* 12.2* 12.9*  HCT 39.3 38.3* 38.4*  MCV 97.3 98.0 94.8  PLT 254 240 226   Cardiac Enzymes: Recent Labs    11/20/22 0732 11/20/22 1037  CKTOTAL 664*  587*   BNP: Invalid input(s): "POCBNP" D-Dimer: No results for input(s): "DDIMER" in the last 72 hours. Hemoglobin A1C: No results for input(s): "HGBA1C" in the last 72 hours. Fasting Lipid Panel: No results for input(s): "CHOL", "HDL", "LDLCALC", "TRIG", "CHOLHDL", "LDLDIRECT" in the last 72 hours. Thyroid Function Tests: No results for input(s): "TSH", "T4TOTAL", "T3FREE", "THYROIDAB" in the last 72 hours.  Invalid input(s): "FREET3" Anemia Panel: No results for input(s): "VITAMINB12", "FOLATE", "FERRITIN", "TIBC", "IRON", "RETICCTPCT" in the last 72 hours.  ECHOCARDIOGRAM COMPLETE  Result Date: 11/21/2022    ECHOCARDIOGRAM REPORT   Patient Name:   Jacob Moses Date of Exam: 11/21/2022 Medical Rec #:  [id number]       Height:       72.0 in Accession #:    [id number]      Weight:       138.6 lb Date of Birth:  [birth year]       BSA:          1.823 m Patient Age:    89 years        BP:           153/73 mmHg Patient Gender: M  HR:           53 bpm. Exam Location:  [facility] Procedure: 2D Echo, Cardiac Doppler and Color Doppler Indications:     Syncope R55  History:         Patient has no prior history of Echocardiogram examinations.                  Risk Factors:Hypertension.  Sonographer:     Cristela Blue Referring Phys:  [id number] Verdene Lennert Diagnosing Phys: Alwyn Pea MD  Sonographer Comments: Image acquisition challenging due to patient body habitus and Pt has pectus excavatum and very bony thorax. IMPRESSIONS  1. Left ventricular ejection fraction, by estimation, is 60 to 65%. The left ventricle has normal function. The left ventricle has no regional wall motion abnormalities. There is moderate concentric left ventricular hypertrophy. Left ventricular diastolic parameters are consistent with Grade I diastolic dysfunction (impaired relaxation).  2. Right ventricular systolic function is normal. The right ventricular size is normal.  3. The mitral valve is grossly normal. Trivial  mitral valve regurgitation.  4. The aortic valve is normal in structure. Aortic valve regurgitation is mild. FINDINGS  Left Ventricle: Left ventricular ejection fraction, by estimation, is 60 to 65%. The left ventricle has normal function. The left ventricle has no regional wall motion abnormalities. The left ventricular internal cavity size was normal in size. There is  moderate concentric left ventricular hypertrophy. Left ventricular diastolic parameters are consistent with Grade I diastolic dysfunction (impaired relaxation). Right Ventricle: The right ventricular size is normal. No increase in right ventricular wall thickness. Right ventricular systolic function is normal. Left Atrium: Left atrial size was normal in size. Right Atrium: Right atrial size was not assessed. Pericardium: There is no evidence of pericardial effusion. Mitral Valve: The mitral valve is grossly normal. Trivial mitral valve regurgitation. MV peak gradient, 7.4 mmHg. The mean mitral valve gradient is 2.0 mmHg. Tricuspid Valve: The tricuspid valve is grossly normal. Tricuspid valve regurgitation is trivial. Aortic Valve: The aortic valve is normal in structure. Aortic valve regurgitation is mild. Aortic valve mean gradient measures 2.5 mmHg. Aortic valve peak gradient measures 4.6 mmHg. Aortic valve area, by VTI measures 3.42 cm. Pulmonic Valve: The pulmonic valve was normal in structure. Pulmonic valve regurgitation is not visualized. Aorta: The ascending aorta was not well visualized. IAS/Shunts: No atrial level shunt detected by color flow Doppler.  LEFT VENTRICLE PLAX 2D LVIDd:         3.60 cm   Diastology LVIDs:         2.40 cm   LV e' medial:    3.81 cm/s LV PW:         1.70 cm   LV E/e' medial:  15.1 LV IVS:        1.30 cm   LV e' lateral:   3.92 cm/s LVOT diam:     2.10 cm   LV E/e' lateral: 14.7 LV SV:         72 LV SV Index:   40 LVOT Area:     3.46 cm  RIGHT VENTRICLE RV Basal diam:  3.30 cm RV Mid diam:    2.60 cm RV S prime:      8.70 cm/s TAPSE (M-mode): 1.4 cm LEFT ATRIUM           Index        RIGHT ATRIUM           Index LA diam:  3.80 cm 2.08 cm/m   RA Area:     14.70 cm LA Vol (A2C): 15.6 ml 8.56 ml/m   RA Volume:   35.00 ml  19.20 ml/m LA Vol (A4C): 28.4 ml 15.58 ml/m  AORTIC VALVE AV Area (Vmax):    3.03 cm AV Area (Vmean):   2.83 cm AV Area (VTI):     3.42 cm AV Vmax:           106.85 cm/s AV Vmean:          72.900 cm/s AV VTI:            0.212 m AV Peak Grad:      4.6 mmHg AV Mean Grad:      2.5 mmHg LVOT Vmax:         93.40 cm/s LVOT Vmean:        59.600 cm/s LVOT VTI:          0.209 m LVOT/AV VTI ratio: 0.99  AORTA Ao Root diam: 2.50 cm MITRAL VALVE                TRICUSPID VALVE MV Area (PHT): 1.91 cm     TR Peak grad:   18.0 mmHg MV Area VTI:   1.95 cm     TR Vmax:        212.00 cm/s MV Peak grad:  7.4 mmHg MV Mean grad:  2.0 mmHg     SHUNTS MV Vmax:       1.36 m/s     Systemic VTI:  0.21 m MV Vmean:      60.3 cm/s    Systemic Diam: 2.10 cm MV Decel Time: 398 msec MV E velocity: 57.60 cm/s MV A velocity: 114.00 cm/s MV E/A ratio:  0.51 Kiaan Overholser D Avagail Whittlesey MD Electronically signed by Alwyn Pea MD Signature Date/Time: 11/21/2022/2:52:05 PM    Final    CT CHEST WO CONTRAST  Result Date: 11/20/2022 CLINICAL DATA:  Respiratory illness, recent fall EXAM: CT CHEST WITHOUT CONTRAST TECHNIQUE: Multidetector CT imaging of the chest was performed following the standard protocol without IV contrast. RADIATION DOSE REDUCTION: This exam was performed according to the departmental dose-optimization program which includes automated exposure control, adjustment of the mA and/or kV according to patient size and/or use of iterative reconstruction technique. COMPARISON:  Previous studies including chest radiograph done today FINDINGS: Cardiovascular: Heart is enlarged in size. Coronary artery calcifications are seen. Calcifications are seen in thoracic aorta and its major branches. There is ectasia of ascending thoracic  aorta measuring 4.1 cm. There is ectasia of left side of the aortic arch measuring 4.1 cm. Midportion of the aortic arch measures 3.2 cm. Descending thoracic aorta measures 3.2 cm. Main pulmonary artery measures 4.1 cm. Mediastinum/Nodes: There are a few subcentimeter nodes seen mediastinum, possibly reactive hyperplasia. Thyroid is smaller than usual in size. Lungs/Pleura: There is no focal pulmonary consolidation. Breathing motion limits evaluation of lower lung fields. Small linear densities are seen in the posterior lower lung fields suggesting scarring or subsegmental atelectasis. Left hemidiaphragm is elevated. There is calcified nodule in lingula. There is no pleural effusion or pneumothorax. Upper Abdomen: Calcifications are seen in abdominal aorta and its major branches. Surgical clips are noted along the lesser curvature aspect of stomach. High density in the lumen of stomach may be Oral medication. Musculoskeletal: There is a fusion of anterior aspects of multiple thoracic vertebral bodies suggesting possible ankylosing spondylitis. Similar finding is also noted in the visualized lower cervical spine  and upper lumbar spine. IMPRESSION: There is no focal pulmonary consolidation. There is no pleural effusion or pneumothorax. Small linear densities in the lower lung fields may suggest scarring or subsegmental atelectasis. Part of this finding may be due to motion artifacts. There is no pleural effusion or pneumothorax. Cardiomegaly. Coronary artery disease. Aortic arteriosclerosis. There is ectasia of the main pulmonary artery suggesting pulmonary arterial hypertension. 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 Recommend annual imaging followup by CTA or MRA. This recommendation follows 2010 ACCF/AHA/AATS/ACR/ASA/SCA/SCAI/SIR/STS/SVM Guidelines for the Diagnosis and Management of Patients with Thoracic Aortic Disease. Circulation.  2010; 121: Y782-N562. Aortic aneurysm NOS (ICD10-I71.9) Other findings as described in the body of the report. Electronically Signed   By: Ernie Avena M.D.   On: 11/20/2022 15:25   DG Chest Port 1 View  Result Date: 11/20/2022 CLINICAL DATA:  Hyperkalemia, mild rhabdomyolysis, possible syncopal episode. EXAM: PORTABLE CHEST 1 VIEW COMPARISON:  CXR 12/13/20 FINDINGS: No pleural effusion. No pneumothorax. Normal cardiac and mediastinal contours. No radiographically apparent displaced rib fractures. There is hazy opacity in the right perihilar region that could represent atelectasis or infection. No radiographically apparent displaced rib fractures. Visualized upper abdomen is unremarkable. IMPRESSION: Hazy opacity in the right perihilar region could represent atelectasis or infection. Electronically Signed   By: Lorenza Cambridge M.D.   On: 11/20/2022 12:41     Echo preserved left ventricular function and at least 60 to 65%  TELEMETRY: Sinus bradycardia rate of 60:  ASSESSMENT AND PLAN:  Principal Problem:   Syncope Active Problems:   SDH (subdural hematoma) (HCC)   Bradycardia   HTN (hypertension)   Anxiety   CKD (chronic kidney disease), stage IIIb   Benign prostatic hyperplasia with lower urinary tract symptoms   Hyperkalemia   Rhabdomyolysis   Opacity of lung on imaging study    Plan History of syncope none recently questionably whether it is related to dehydration and/or bradycardia we have held beta-blockers  Continue to correct hyperkalemia with l Lokelma Hypertension continue hydralazine amlodipine No clear indication for permanent pacemaker Recommend hydration as well as treatment for rhabdo Chronic renal sufficiency consider nephrology input continue adequate hydration Defer any consideration for permanent pacemaker at this point patient prefers conservative management   Alwyn Pea, MD 11/22/2022 11:51 AM

## 2022-11-22 NOTE — Progress Notes (Signed)
PROGRESS NOTE    Jacob Moses  JWJ:[id number] DOB: [birth year] DOA: 11/20/2022 PCP: Jaclyn Shaggy, MD  [id number]  LOS: 1 day   Brief hospital course:   Assessment & Plan: Jacob Moses is a 89 y.o. male with medical history significant of hypertension, BPH with urinary retention, previous fall with SDH, insomnia on Xanax, CKD stage IIIa who presents to the ED due to a fall.  Per ED provider, pt reported that he fell and spent the night on the floor. EMS reported that they did find him on the floor after nursing staff checked on him in the morning. Patient denied any head injury or loss of consciousness but stated "my lights went out". EMS reported that his walker was not near him where they found him.    * Questionable Syncope --unclear hx from the pt. -Telemetry monitoring given bradycardia --cardiology consulted  Weakness and fall --PT eval, rec SNF rehab  Hyperkalemia Patient has a history of hyperkalemia in the past.  Uncertain etiology at this time as patient's renal dysfunction is not so severe.  Rhabdomyolysis, however this is also mild.  He is not on any medications that can cause this.   --received 2 doses of Lokelma 10g with improvement next day.  Normalized today Plan: --cont Lokelma 10 mg BID for 1 more day  Bradycardia History of sinus bradycardia, with current rates in the 50s. --cardiology consulted, no further recommendation  HTN (hypertension) Patient was significantly hypertensive on presentation, likely due to self discontinuation of home antihypertensives.   --cont home home amlodipine at increased 10 mg daily --IV hydralazine PRN  CKD (chronic kidney disease), stage IIIb Patient has a history of CKD, initially stage IIIa, however currently seems stage IIIb.  He has previously refused follow-up with nephrology.    Benign prostatic hyperplasia with lower urinary tract symptoms --not taking home finasteride  Rhabdomyolysis, mild --CK 664. - S/p  1.25 L of normal saline f/b NS@100  for 10 hours  Anxiety Patient is on Xanax 0.5 mg daily at home for insomnia and anxiety.  this may have contributed to his ground-level fall.   --cont Xanax at lower dose of 0.25 mg nightly  Hx of SDH (subdural hematoma) (HCC) Stable on CT imaging.  No further interventions at this point.  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    DVT prophylaxis: Lovenox SQ Code Status: DNR  Family Communication: daughter updated at bedside today Level of care: Med-Surg Dispo:   The patient is from: home Anticipated d/c is to: SNF rehab whenever bed available   Subjective and Interval History:  Pt reported feeling better today.  Not dizzy while sitting and resting.   Objective: Vitals:   11/22/22 0003 11/22/22 0500 11/22/22 0527 11/22/22 1200  BP: (!) 144/69  (!) 172/78 115/63  Pulse: 61  (!) 52 61  Resp: 16  16   Temp: 98.2 F (36.8 C)  (!) 97.5 F (36.4 C) (!) 97.5 F (36.4 C)  TempSrc:    Oral  SpO2: 97%  99% 98%  Weight:  61.5 kg    Height:        Intake/Output Summary (Last 24 hours) at 11/22/2022 1520 Last data filed at 11/22/2022 0500 Gross per 24 hour  Intake 123 ml  Output 1950 ml  Net -1827 ml   Filed Weights   11/20/22 0730 11/20/22 1542 11/22/22 0500  Weight: 58.1 kg 62.9 kg 61.5 kg    Examination:  Constitutional: NAD, AAOx3, sitting in recliner HEENT: conjunctivae and lids normal, EOMI CV: No cyanosis.   RESP: normal respiratory effort, on RA Neuro: II - XII grossly intact.     Data Reviewed: I have personally reviewed labs and imaging studies  Time spent: 35 minutes  Darlin Priestly, MD [facility] If 7PM-7AM, please contact night-coverage 11/22/2022, 3:20 PM

## 2022-11-22 NOTE — Plan of Care (Signed)

## 2022-11-23 DIAGNOSIS — R55 Syncope and collapse: Secondary | ICD-10-CM

## 2022-11-23 LAB — CBC
HCT: 42.2 % (ref 39.0–52.0)
Hemoglobin: 13.9 g/dL (ref 13.0–17.0)
MCH: 31.2 pg (ref 26.0–34.0)
MCHC: 32.9 g/dL (ref 30.0–36.0)
MCV: 94.8 fL (ref 80.0–100.0)
Platelets: 244 10*3/uL (ref 150–400)
RBC: 4.45 MIL/uL (ref 4.22–5.81)
RDW: 12.2 % (ref 11.5–15.5)
WBC: 11.8 10*3/uL — ABNORMAL HIGH (ref 4.0–10.5)
nRBC: 0 % (ref 0.0–0.2)

## 2022-11-23 LAB — BASIC METABOLIC PANEL
Anion gap: 11 (ref 5–15)
BUN: 39 mg/dL — ABNORMAL HIGH (ref 8–23)
CO2: 24 mmol/L (ref 22–32)
Calcium: 9 mg/dL (ref 8.9–10.3)
Chloride: 100 mmol/L (ref 98–111)
Creatinine, Ser: 1.78 mg/dL — ABNORMAL HIGH (ref 0.61–1.24)
GFR, Estimated: 35 mL/min — ABNORMAL LOW (ref >60)
Glucose, Bld: 105 mg/dL — ABNORMAL HIGH (ref 70–99)
Potassium: 4.9 mmol/L (ref 3.5–5.1)
Sodium: 135 mmol/L (ref 135–145)

## 2022-11-23 LAB — MAGNESIUM: Magnesium: 2.7 mg/dL — ABNORMAL HIGH (ref 1.7–2.4)

## 2022-11-23 MED ORDER — HALOPERIDOL LACTATE 5 MG/ML IJ SOLN
2.0000 mg | Freq: Four times a day (QID) | INTRAMUSCULAR | Status: DC | PRN
  Administered 2022-11-23: 2 mg via INTRAVENOUS
  Filled 2022-11-23: qty 1

## 2022-11-23 NOTE — Progress Notes (Signed)
Patient ID: Jacob Moses, male   DOB: 10/12/1927, 89 y.o.   MRN: [id number] [facility]    SUBJECTIVE: Patient resting comfortably in bed denies any pain weakness resting comfortably no shortness of breath no chest pain.  Patient refuses any further aggressive management   Vitals:   11/22/22 2224 11/23/22 0106 11/23/22 0608 11/23/22 0821  BP: (!) 151/86  (!) 142/101 (!) 158/78  Pulse: 60  (!) 102   Resp: 20  20   Temp:   97.6 F (36.4 C) 98 F (36.7 C)  TempSrc:   Oral Oral  SpO2: 95%  (!) 88% 100%  Weight:  61.4 kg    Height:  6\' 1"  (1.854 m)       Intake/Output Summary (Last 24 hours) at 11/23/2022 1324 Last data filed at 11/23/2022 4098 Gross per 24 hour  Intake 237 ml  Output 430 ml  Net -193 ml      PHYSICAL EXAM  General: Well developed, well nourished, in no acute distress HEENT:  Normocephalic and atramatic Neck:  No JVD.  Lungs: Clear bilaterally to auscultation and percussion. Heart: HRRR . Normal S1 and S2 without gallops or murmurs.  Abdomen: Bowel sounds are positive, abdomen soft and non-tender  Msk:  Back normal, normal gait. Normal strength and tone for age. Extremities: No clubbing, cyanosis or edema.   Neuro: Alert and oriented X 3. Psych:  Good affect, responds appropriately   LABS: Basic Metabolic Panel: Recent Labs    11/22/22 0453 11/23/22 0750  NA 133* 135  K 4.3 4.9  CL 102 100  CO2 24 24  GLUCOSE 118* 105*  BUN 32* 39*  CREATININE 1.40* 1.78*  CALCIUM 8.7* 9.0  MG 2.5* 2.7*   Liver Function Tests: No results for input(s): "AST", "ALT", "ALKPHOS", "BILITOT", "PROT", "ALBUMIN" in the last 72 hours. No results for input(s): "LIPASE", "AMYLASE" in the last 72 hours. CBC: Recent Labs    11/22/22 0453 11/23/22 0750  WBC 10.4 11.8*  HGB 12.9* 13.9  HCT 38.4* 42.2  MCV 94.8 94.8  PLT 226 244   Cardiac Enzymes: No results for input(s): "CKTOTAL", "CKMB", "CKMBINDEX", "TROPONINI" in the last 72 hours. BNP: Invalid  input(s): "POCBNP" D-Dimer: No results for input(s): "DDIMER" in the last 72 hours. Hemoglobin A1C: No results for input(s): "HGBA1C" in the last 72 hours. Fasting Lipid Panel: No results for input(s): "CHOL", "HDL", "LDLCALC", "TRIG", "CHOLHDL", "LDLDIRECT" in the last 72 hours. Thyroid Function Tests: No results for input(s): "TSH", "T4TOTAL", "T3FREE", "THYROIDAB" in the last 72 hours.  Invalid input(s): "FREET3" Anemia Panel: No results for input(s): "VITAMINB12", "FOLATE", "FERRITIN", "TIBC", "IRON", "RETICCTPCT" in the last 72 hours.  No results found.   Echo preserved left ventricular function EF around 60%  TELEMETRY: Normal sinus rhythm nonspecific T wave changes rate of 70:  ASSESSMENT AND PLAN:  Principal Problem:   Syncope Active Problems:   SDH (subdural hematoma) (HCC)   Bradycardia   HTN (hypertension)   Anxiety   CKD (chronic kidney disease), stage IIIb   Benign prostatic hyperplasia with lower urinary tract symptoms   Hyperkalemia   Rhabdomyolysis   Opacity of lung on imaging study    Plan Syncope with fall probably related to dehydration recommend continue conservative medical therapy Low intensity subdural hematoma probably related to the fall continue conservative therapy Bradycardia improved avoid AV nodal blocking drugs no clinically troponin pacemaker Hyperkalemia  continue current management recommend leukemia follow-up renal function adequate hydration Chronic renal insufficiency probably related to  dehydration continue hydration History of rhabdomyolysis probably related to the fall continue adequate hydration Recommend conservative cardiac input    Alwyn Pea, MD 11/23/2022 1:24 PM

## 2022-11-23 NOTE — Progress Notes (Signed)
PROGRESS NOTE    Jacob Moses  MVH:[id number] DOB: [birth year] DOA: 11/20/2022 PCP: Jaclyn Shaggy, MD  [id number]/[id number]  LOS: 2 days   Brief hospital course:   Assessment & Plan: Jacob Moses is a 89 y.o. male with medical history significant of hypertension, BPH with urinary retention, previous fall with SDH, insomnia on Xanax, CKD stage IIIa who presents to the ED due to a fall.  Per ED provider, pt reported that he fell and spent the night on the floor. EMS reported that they did find him on the floor after nursing staff checked on him in the morning. Patient denied any head injury or loss of consciousness but stated "my lights went out". EMS reported that his walker was not near him where they found him.    * Questionable Syncope --unclear hx from the pt. -Telemetry monitoring given bradycardia --cardiology consulted  Weakness and fall --PT eval, rec SNF rehab  Hyperkalemia Patient has a history of hyperkalemia in the past.  Uncertain etiology at this time as patient's renal dysfunction is not so severe.  Rhabdomyolysis, however this is also mild.  He is not on any medications that can cause this.   --received 2 doses of Lokelma 10g with improvement next day.  Normalized today Plan: --cont Lokelma 10 mg BID for 1 more day  Bradycardia History of sinus bradycardia, with current rates in the 50s. --cardiology consulted, no further recommendation  HTN (hypertension) Patient was significantly hypertensive on presentation, likely due to self discontinuation of home antihypertensives.   --cont home home amlodipine at increased 10 mg daily --IV hydralazine PRN  CKD (chronic kidney disease), stage IIIb Patient has a history of CKD, initially stage IIIa, however currently seems stage IIIb.  He has previously refused follow-up with nephrology.    Benign prostatic hyperplasia with lower urinary tract symptoms --not taking home finasteride  Rhabdomyolysis, mild --CK 664. - S/p  1.25 L of normal saline f/b NS@100  for 10 hours  Anxiety Patient is on Xanax 0.5 mg daily at home for insomnia and anxiety.  this may have contributed to his ground-level fall.   --cont Xanax at lower dose of 0.25 mg nightly  Hx of SDH (subdural hematoma) (HCC) Stable on CT imaging.  No further interventions at this point.  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    DVT prophylaxis: Lovenox SQ Code Status: DNR  Family Communication:  Level of care: Med-Surg Dispo:   The patient is from: home Anticipated d/c is to: SNF rehab whenever bed available   Subjective and Interval History:  RN reported pt developed confusion and agitation today.   Objective: Vitals:   11/23/22 0608 11/23/22 0821 11/23/22 1649 11/23/22 1957  BP: (!) 142/101 (!) 158/78 (!) 155/96 (!) 150/92  Pulse: (!) 102  69 81  Resp: 20  18 18   Temp: 97.6 F (36.4 C) 98 F (36.7 C) 98 F (36.7 C) 98.7 F (37.1 C)  TempSrc: Oral Oral  Oral  SpO2: (!) 88% 100% 100% 96%  Weight:      Height:        Intake/Output Summary (Last 24 hours) at 11/23/2022 2041 Last data filed at 11/23/2022 0925 Gross per 24 hour  Intake --  Output 180 ml  Net -180 ml   Filed Weights   11/20/22 1542 11/22/22 0500 11/23/22 0106  Weight: 62.9 kg 61.5 kg 61.4 kg    Examination:   Constitutional: NAD CV: No  cyanosis.   RESP: normal respiratory effort, on RA   Data Reviewed: I have personally reviewed labs and imaging studies  Time spent: 25 minutes  Darlin Priestly, MD [facility] If 7PM-7AM, please contact night-coverage 11/23/2022, 8:41 PM

## 2022-11-24 DIAGNOSIS — R42 Dizziness and giddiness: Secondary | ICD-10-CM | POA: Diagnosis not present

## 2022-11-24 LAB — BASIC METABOLIC PANEL
Anion gap: 10 (ref 5–15)
BUN: 46 mg/dL — ABNORMAL HIGH (ref 8–23)
CO2: 25 mmol/L (ref 22–32)
Calcium: 8.5 mg/dL — ABNORMAL LOW (ref 8.9–10.3)
Chloride: 102 mmol/L (ref 98–111)
Creatinine, Ser: 1.94 mg/dL — ABNORMAL HIGH (ref 0.61–1.24)
GFR, Estimated: 32 mL/min — ABNORMAL LOW (ref >60)
Glucose, Bld: 108 mg/dL — ABNORMAL HIGH (ref 70–99)
Potassium: 4.5 mmol/L (ref 3.5–5.1)
Sodium: 137 mmol/L (ref 135–145)

## 2022-11-24 LAB — CBC
HCT: 36.9 % — ABNORMAL LOW (ref 39.0–52.0)
Hemoglobin: 12.5 g/dL — ABNORMAL LOW (ref 13.0–17.0)
MCH: 32.3 pg (ref 26.0–34.0)
MCHC: 33.9 g/dL (ref 30.0–36.0)
MCV: 95.3 fL (ref 80.0–100.0)
Platelets: 242 10*3/uL (ref 150–400)
RBC: 3.87 MIL/uL — ABNORMAL LOW (ref 4.22–5.81)
RDW: 12.4 % (ref 11.5–15.5)
WBC: 10.6 10*3/uL — ABNORMAL HIGH (ref 4.0–10.5)
nRBC: 0 % (ref 0.0–0.2)

## 2022-11-24 LAB — GLUCOSE, CAPILLARY
Glucose-Capillary: 110 mg/dL — ABNORMAL HIGH (ref 70–99)
Glucose-Capillary: 174 mg/dL — ABNORMAL HIGH (ref 70–99)
Glucose-Capillary: 166 mg/dL — ABNORMAL HIGH (ref 70–99)

## 2022-11-24 LAB — MAGNESIUM: Magnesium: 2.9 mg/dL — ABNORMAL HIGH (ref 1.7–2.4)

## 2022-11-24 MED ORDER — QUETIAPINE FUMARATE 25 MG PO TABS
50.0000 mg | ORAL_TABLET | Freq: Every day | ORAL | Status: DC
  Administered 2022-11-24: 50 mg via ORAL
  Filled 2022-11-24: qty 2

## 2022-11-24 MED ORDER — ALPRAZOLAM 0.5 MG PO TABS: 0.5000 mg | ORAL_TABLET | Freq: Every evening | ORAL | Status: DC | PRN

## 2022-11-24 MED ORDER — ALPRAZOLAM 0.25 MG PO TABS: 0.2500 mg | ORAL_TABLET | Freq: Every evening | ORAL | Status: DC | PRN

## 2022-11-24 MED ORDER — SODIUM CHLORIDE 0.9 % IV SOLN: INTRAVENOUS | Status: AC

## 2022-11-24 NOTE — Plan of Care (Signed)
?  Problem: Clinical Measurements: ?Goal: Will remain free from infection ?Outcome: Progressing ?  ?

## 2022-11-24 NOTE — Care Management Important Message (Signed)
Important Message  Patient Details  Name: Jacob Moses MRN: [id number] Date of Birth: 1927-10-26   Medicare Important Message Given:  N/A - LOS <3 / Initial given by admissions     Johnell Comings 11/24/2022, 9:14 AM

## 2022-11-24 NOTE — TOC Progression Note (Signed)
Transition of Care [facility]) - Progression Note    Patient Details  Name: Jacob Moses MRN: [id number] Date of Birth: 11-21-1927  Transition of Care [facility]) CM/SW Contact  Allena Katz, LCSW Phone Number: 11/24/2022, 9:50 AM  Clinical Narrative:   CSW spoke with daughter about bed offers. Daughter states she would like him to go to [facility] health care as it is closest to her house. CSW will relay information to team.    Expected Discharge Plan: Skilled Nursing Facility Barriers to Discharge: Continued Medical Work up  Expected Discharge Plan and Services       Living arrangements for the past 2 months: Single Family Home                                       Social Determinants of Health (SDOH) Interventions SDOH Screenings   Food Insecurity: No Food Insecurity (11/20/2022)  Housing: Patient Declined (11/20/2022)  Transportation Needs: No Transportation Needs (11/20/2022)  Utilities: Patient Declined (11/20/2022)  Tobacco Use: Medium Risk (11/20/2022)    Readmission Risk Interventions     No data to display

## 2022-11-24 NOTE — Progress Notes (Signed)
PROGRESS NOTE    Jacob Moses  WUJ:[id number] DOB: [birth year] DOA: 11/20/2022 PCP: Jaclyn Shaggy, MD  119A/119A-AA  LOS: 3 days   Brief hospital course:   Assessment & Plan: Jacob Moses is a 89 y.o. male with medical history significant of hypertension, BPH with urinary retention, previous fall with SDH, insomnia on Xanax, CKD stage IIIa who presents to the ED due to a fall.  Per ED provider, pt reported that he fell and spent the night on the floor. EMS reported that they did find him on the floor after nursing staff checked on him in the morning. Patient denied any head injury or loss of consciousness but stated "my lights went out". EMS reported that his walker was not near him where they found him.    * Questionable Syncope Orthostasis  --unclear hx from the pt. --cardiology consulted, rec avoiding AV nodal blocker.  Weakness and fall --PT eval, rec SNF rehab  Hyperkalemia Patient has a history of hyperkalemia in the past.  Uncertain etiology at this time as patient's renal dysfunction is not so severe.  Rhabdomyolysis, however this is also mild.  He is not on any medications that can cause this.   --received 2 doses of Lokelma 10g with improvement next day.  Normalized  Plan: --d/c Lokelma today  Bradycardia History of sinus bradycardia, with current rates in the 50s. --cardiology consulted, no further recommendation  HTN (hypertension) Patient was significantly hypertensive on presentation, however, found to have low normal BP and orthostasis today --d/c amlodipine  CKD (chronic kidney disease), stage IIIb Patient has a history of CKD, initially stage IIIa, however currently seems stage IIIb.  He has previously refused follow-up with nephrology.    Benign prostatic hyperplasia with lower urinary tract symptoms --not taking home finasteride  Rhabdomyolysis, mild --CK 664. - S/p 1.25 L of normal saline f/b NS@100  for 10 hours  Anxiety Patient is on Xanax 0.5  mg daily at home for insomnia and anxiety.  this may have contributed to his ground-level fall.   --cont Xanax at lower dose of 0.25 mg nightly  Hx of SDH (subdural hematoma) (HCC) Stable on CT imaging.  No further interventions at this point.  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   Hospital delirium --start seroquel  Right LE wound, POA --had large skin tear on presentation, likely from the fall.   --wound RN consult  Severe malnutrition in context of social or environmental circumstances, Underweight    DVT prophylaxis: Lovenox SQ Code Status: DNR  Family Communication: daughter updated at bedside today Level of care: Med-Surg Dispo:   The patient is from: home Anticipated d/c is to: SNF    Subjective and Interval History:  PT reported BP dropped with standing.  Daughter reported pt being more confused and having visual hallucinations.  Pt denied dizziness while seating.   Objective: Vitals:   11/24/22 0739 11/24/22 0829 11/24/22 1000 11/24/22 1647  BP: (!) 161/69 (!) 113/53 (!) 129/59 121/67  Pulse: 66 87  72  Resp: 16 16  16   Temp: 97.6 F (36.4 C) 98 F (36.7 C)  (!) 97.4 F (36.3 C)  TempSrc:  Oral  Oral  SpO2: 95% 97%  99%  Weight:      Height:        Intake/Output Summary (Last 24 hours) at 11/24/2022 1848 Last data filed at 11/24/2022 1500 Gross per 24 hour  Intake 446.78 ml  Output  800 ml  Net -353.22 ml   Filed Weights   11/22/22 0500 11/23/22 0106 11/24/22 0500  Weight: 61.5 kg 61.4 kg 60.1 kg    Examination:   Constitutional: NAD, sleepy but arousable HEENT: conjunctivae and lids normal, EOMI CV: No cyanosis.   RESP: normal respiratory effort, on RA SKIN: warm, dry, long wound over right LE      Data Reviewed: I have personally reviewed labs and imaging studies  Time spent: 50 minutes  Darlin Priestly, MD [facility] If 7PM-7AM, please contact  night-coverage 11/24/2022, 6:48 PM

## 2022-11-24 NOTE — Progress Notes (Signed)
Physical Therapy Treatment Patient Details Name: Jacob Moses MRN: [id number] DOB: July 31, 1927 Today's Date: 11/24/2022   History of Present Illness Pt is a 89 y/o who presents after a syncopal event and  fall at home, mild rhabdomyolysis, possible aspiration pneumonitis and suspected a-flutter, CT imaging reveals aneurym on aortic arch and thoracic aorta, regular follow-up imaging recommended. PMH significant for HOH, HTN, insomnia, hx of stomach ulcers, vertigo    PT Comments  Pt in chair at start of session, denied pain, oriented to self, place, family in room but did return to resting with eyes closed when not directly interacted with. Overall the patient still demonstrated fatigue and needing assistance with all mobility tasks. Sit <> stand twice with modA and RW, pt unable to tolerate standing >30seconds due to fatigue and pt reported dizziness. BP assessed in sitting and standing  + positive for orthostatics (83/48 in initial standing).  Care team updated. Pt in recliner with needs in reach at end of session. The patient would benefit from further skilled PT intervention to continue to progress towards goals.     Assistance Recommended at Discharge Frequent or constant Supervision/Assistance  If plan is discharge home, recommend the following:  Can travel by private vehicle    A lot of help with walking and/or transfers;Assistance with cooking/housework;Assist for transportation;Help with stairs or ramp for entrance;A lot of help with bathing/dressing/bathroom;Direct supervision/assist for medications management   No  Equipment Recommendations  Other (comment) (TBD at next venue of care)    Recommendations for Other Services       Precautions / Restrictions Precautions Precautions: Fall Precaution Comments: orthostatic in standing Restrictions Weight Bearing Restrictions: No     Mobility  Bed Mobility               General bed mobility comments: pt up in chair at  start of session    Transfers Overall transfer level: Needs assistance Equipment used: Rolling walker (2 wheels) Transfers: Sit to/from Stand Sit to Stand: Mod assist           General transfer comment: performed twice, VC for sequencing but needed modA despite education    Ambulation/Gait               General Gait Details: unable due to reported fatigue/weakness, low BP in standing   Stairs             Wheelchair Mobility     Tilt Bed    Modified Rankin (Stroke Patients Only)       Balance Overall balance assessment: Needs assistance Sitting-balance support: Feet supported Sitting balance-Leahy Scale: Fair     Standing balance support: Bilateral upper extremity supported, During functional activity, Reliant on assistive device for balance Standing balance-Leahy Scale: Poor                              Cognition Arousal/Alertness: Awake/alert Behavior During Therapy: WFL for tasks assessed/performed Overall Cognitive Status: Within Functional Limits for tasks assessed                                 General Comments: oriented to self, place, disoriented to time, some situation. noted to close eyes and fall asleep if not interacted with, reported fatigue        Exercises Other Exercises Other Exercises: seated ankle pumps and LAQ to encourage blood flow  General Comments General comments (skin integrity, edema, etc.): pt dizzy with all standing attempts      Pertinent Vitals/Pain Pain Assessment Pain Assessment: No/denies pain    Home Living                          Prior Function            PT Goals (current goals can now be found in the care plan section) Progress towards PT goals: Progressing toward goals    Frequency    Min 1X/week      PT Plan Current plan remains appropriate    Co-evaluation              AM-PAC PT "6 Clicks" Mobility   Outcome Measure  Help needed  turning from your back to your side while in a flat bed without using bedrails?: A Little Help needed moving from lying on your back to sitting on the side of a flat bed without using bedrails?: A Little Help needed moving to and from a bed to a chair (including a wheelchair)?: A Little Help needed standing up from a chair using your arms (e.g., wheelchair or bedside chair)?: A Lot Help needed to walk in hospital room?: A Lot Help needed climbing 3-5 steps with a railing? : A Lot 6 Click Score: 15    End of Session Equipment Utilized During Treatment: Gait belt Activity Tolerance: Patient limited by fatigue Patient left: in bed;with call bell/phone within reach;with chair alarm set Nurse Communication: Mobility status PT Visit Diagnosis: Unsteadiness on feet (R26.81);Muscle weakness (generalized) (M62.81);History of falling (Z91.81)     Time: 3329-5188 PT Time Calculation (min) (ACUTE ONLY): 23 min  Charges:    $Therapeutic Activity: 23-37 mins PT General Charges $$ ACUTE PT VISIT: 1 Visit                     Olga Coaster PT, DPT 11:20 AM,11/24/22

## 2022-11-25 DIAGNOSIS — R42 Dizziness and giddiness: Secondary | ICD-10-CM | POA: Diagnosis not present

## 2022-11-25 LAB — BASIC METABOLIC PANEL
Anion gap: 7 (ref 5–15)
BUN: 59 mg/dL — ABNORMAL HIGH (ref 8–23)
CO2: 25 mmol/L (ref 22–32)
Calcium: 8.1 mg/dL — ABNORMAL LOW (ref 8.9–10.3)
Chloride: 100 mmol/L (ref 98–111)
Creatinine, Ser: 1.92 mg/dL — ABNORMAL HIGH (ref 0.61–1.24)
GFR, Estimated: 32 mL/min — ABNORMAL LOW (ref >60)
Glucose, Bld: 109 mg/dL — ABNORMAL HIGH (ref 70–99)
Potassium: 4.4 mmol/L (ref 3.5–5.1)
Sodium: 132 mmol/L — ABNORMAL LOW (ref 135–145)

## 2022-11-25 LAB — CBC
HCT: 33.9 % — ABNORMAL LOW (ref 39.0–52.0)
Hemoglobin: 11.2 g/dL — ABNORMAL LOW (ref 13.0–17.0)
MCH: 31.5 pg (ref 26.0–34.0)
MCHC: 33 g/dL (ref 30.0–36.0)
MCV: 95.5 fL (ref 80.0–100.0)
Platelets: 221 10*3/uL (ref 150–400)
RBC: 3.55 MIL/uL — ABNORMAL LOW (ref 4.22–5.81)
RDW: 12.5 % (ref 11.5–15.5)
WBC: 12.9 10*3/uL — ABNORMAL HIGH (ref 4.0–10.5)
nRBC: 0 % (ref 0.0–0.2)

## 2022-11-25 LAB — MAGNESIUM: Magnesium: 2.8 mg/dL — ABNORMAL HIGH (ref 1.7–2.4)

## 2022-11-25 LAB — GLUCOSE, CAPILLARY: Glucose-Capillary: 113 mg/dL — ABNORMAL HIGH (ref 70–99)

## 2022-11-25 MED ORDER — QUETIAPINE FUMARATE 50 MG PO TABS: 50.0000 mg | ORAL_TABLET | Freq: Every day | ORAL | Status: DC

## 2022-11-25 MED ORDER — ENSURE ENLIVE PO LIQD: 237.0000 mL | Freq: Three times a day (TID) | ORAL | Status: DC

## 2022-11-25 MED ORDER — ACETAMINOPHEN 325 MG PO TABS: 650.0000 mg | ORAL_TABLET | Freq: Four times a day (QID) | ORAL | Status: DC | PRN

## 2022-11-25 MED ORDER — MEDIHONEY WOUND/BURN DRESSING EX PSTE
1.0000 | PASTE | Freq: Every day | CUTANEOUS | Status: DC
  Filled 2022-11-25: qty 44

## 2022-11-25 MED ORDER — ALPRAZOLAM ER 0.5 MG PO TB24: 0.5000 mg | ORAL_TABLET | Freq: Every evening | ORAL | 0 refills | Status: DC | PRN

## 2022-11-25 MED ORDER — SODIUM CHLORIDE 0.9 % IV SOLN: INTRAVENOUS | Status: DC

## 2022-11-25 MED ORDER — FREE WATER: 200.0000 mL | Freq: Three times a day (TID) | Status: DC

## 2022-11-25 MED ORDER — MEDIHONEY WOUND/BURN DRESSING EX PSTE: 1.0000 | PASTE | Freq: Every day | CUTANEOUS | Status: DC

## 2022-11-25 MED ORDER — MECLIZINE HCL 25 MG PO TABS: 25.0000 mg | ORAL_TABLET | Freq: Three times a day (TID) | ORAL | 0 refills | Status: DC | PRN

## 2022-11-25 NOTE — Progress Notes (Signed)
Physical Therapy Treatment Patient Details Name: Jacob Moses MRN: [id number] DOB: Apr 02, 1928 Today's Date: 11/25/2022   History of Present Illness Pt is a 89 y/o who presents after a syncopal event and  fall at home, mild rhabdomyolysis, possible aspiration pneumonitis and suspected a-flutter, CT imaging reveals aneurym on aortic arch and thoracic aorta, regular follow-up imaging recommended. PMH significant for HOH, HTN, insomnia, hx of stomach ulcers, vertigo    PT Comments  Pt much more alert this session, denied dizziness throughout. Orthostatic vitals negative today and pt with good progress towards goals. MinA for sit <> stand for lift as well as eccentric lowering. He ambulated ~52ft, ~70ft, and then an additional 76ft with seated rest breaks. CGA with step to gait pattern and RW, pt with some RLE fatigue/pain, but did not quantify at end of session. Pt up in recliner at end of session with needs in reach. The patient would benefit from further skilled PT intervention to continue to progress towards goals. Recommendation remains appropriate.        Assistance Recommended at Discharge Frequent or constant Supervision/Assistance  If plan is discharge home, recommend the following:  Can travel by private vehicle    A lot of help with walking and/or transfers;Assistance with cooking/housework;Assist for transportation;Help with stairs or ramp for entrance;A lot of help with bathing/dressing/bathroom;Direct supervision/assist for medications management   No  Equipment Recommendations  Other (comment) (TBD at next venue of care)    Recommendations for Other Services       Precautions / Restrictions Precautions Precautions: Fall Precaution Comments: watch orthostatics; was fine 7/23 with OT session, but has hx of orthostatic hypotension Restrictions Weight Bearing Restrictions: No     Mobility  Bed Mobility               General bed mobility comments: pt up in chair at  start of session    Transfers Overall transfer level: Needs assistance Equipment used: Rolling walker (2 wheels) Transfers: Sit to/from Stand Sit to Stand: Min assist                Ambulation/Gait Ambulation/Gait assistance: Min guard Gait Distance (Feet):  (67ft, 33ft, 33ft) Assistive device: Rolling walker (2 wheels) Gait Pattern/deviations: Step-to pattern       General Gait Details: fatigued over time, BP in standing at 3 min 93/65   Stairs             Wheelchair Mobility     Tilt Bed    Modified Rankin (Stroke Patients Only)       Balance Overall balance assessment: Needs assistance Sitting-balance support: Feet supported Sitting balance-Leahy Scale: Good     Standing balance support: Bilateral upper extremity supported, During functional activity, Reliant on assistive device for balance Standing balance-Leahy Scale: Poor                              Cognition Arousal/Alertness: Awake/alert Behavior During Therapy: WFL for tasks assessed/performed Overall Cognitive Status: Within Functional Limits for tasks assessed                                 General Comments: follows all cues; awake and alert.        Exercises      General Comments General comments (skin integrity, edema, etc.): Orthostatics taken during session. Semi=reclined BP 130/75; seated 121/75; standing 114/94. Pt denies  dizziness throughout session.      Pertinent Vitals/Pain Pain Assessment Pain Assessment: No/denies pain    Home Living                          Prior Function            PT Goals (current goals can now be found in the care plan section) Progress towards PT goals: Progressing toward goals    Frequency    Min 1X/week      PT Plan Current plan remains appropriate    Co-evaluation              AM-PAC PT "6 Clicks" Mobility   Outcome Measure  Help needed turning from your back to your side  while in a flat bed without using bedrails?: A Little Help needed moving from lying on your back to sitting on the side of a flat bed without using bedrails?: A Little Help needed moving to and from a bed to a chair (including a wheelchair)?: A Little Help needed standing up from a chair using your arms (e.g., wheelchair or bedside chair)?: A Little Help needed to walk in hospital room?: A Little Help needed climbing 3-5 steps with a railing? : A Lot 6 Click Score: 17    End of Session Equipment Utilized During Treatment: Gait belt Activity Tolerance: Patient tolerated treatment well Patient left: with call bell/phone within reach;with chair alarm set;in chair Nurse Communication: Mobility status PT Visit Diagnosis: Unsteadiness on feet (R26.81);Muscle weakness (generalized) (M62.81);History of falling (Z91.81)     Time: 1030-1100 PT Time Calculation (min) (ACUTE ONLY): 30 min  Charges:    $Therapeutic Activity: 23-37 mins PT General Charges $$ ACUTE PT VISIT: 1 Visit                     Olga Coaster PT, DPT 11:49 AM,11/25/22

## 2022-11-25 NOTE — TOC Progression Note (Signed)
Transition of Care [facility]) - Progression Note    Patient Details  Name: Jacob Moses MRN: [id number] Date of Birth: [birth year]  Transition of Care [facility]) CM/SW Contact  Allena Katz, LCSW Phone Number: 11/25/2022, 9:59 AM  Clinical Narrative:   CSW spoke with daughter who now wants [facility]. [facility] Pound notified and can take him today pending cards clearance.    Expected Discharge Plan: Skilled Nursing Facility Barriers to Discharge: Continued Medical Work up  Expected Discharge Plan and Services       Living arrangements for the past 2 months: Single Family Home                                       Social Determinants of Health (SDOH) Interventions SDOH Screenings   Food Insecurity: No Food Insecurity (11/20/2022)  Housing: Patient Declined (11/20/2022)  Transportation Needs: No Transportation Needs (11/20/2022)  Utilities: Patient Declined (11/20/2022)  Tobacco Use: Medium Risk (11/20/2022)    Readmission Risk Interventions     No data to display

## 2022-11-25 NOTE — TOC Transition Note (Addendum)
Transition of Care [facility]) - CM/SW Discharge Note   Patient Details  Name: Jacob Moses MRN: [id number] Date of Birth: 1927/08/31  [facility]) CM/SW Contact:  Allena Katz, LCSW Phone Number: 11/25/2022, 10:41 AM   Clinical Narrative:   Pt has orders to discharge to [facility]. Dc summary to be sent once in. Lupita Leash notified. Deborah notified. Medical necessity printed to unit. CSW to call acems once summary is in. Going to room 322    Final next level of care: Skilled Nursing Facility Barriers to Discharge: Barriers Resolved   Patient Goals and CMS Choice CMS Medicare.gov Compare Post Acute Care list provided to:: Patient Represenative (must comment) (daughter) Choice offered to / list presented to : [facility] POA / Guardian  Discharge Placement                Patient chooses bed at: [facility] Patient to be transferred to facility by: [facility] Name of family member notified: Lupita Leash Patient and family notified of of transfer: 11/25/22  Discharge Plan and Services Additional resources added to the After Visit Summary for                                       Social Determinants of Health (SDOH) Interventions SDOH Screenings   Food Insecurity: No Food Insecurity (11/20/2022)  Housing: Patient Declined (11/20/2022)  Transportation Needs: No Transportation Needs (11/20/2022)  Utilities: Patient Declined (11/20/2022)  Tobacco Use: Medium Risk (11/20/2022)     Readmission Risk Interventions     No data to display

## 2022-11-25 NOTE — Consult Note (Signed)
WOC Nurse Consult Note: patient lives in assisted living, apparently found down by staff after a fall  Reason for Consult: R lower leg skin tear  Wound type: 1.  R lower leg full thickness, traumatic.  2.  L lower leg partial thickness, traumatic  3.  L shoulder partial thickness, traumatic  4.  L mid back 2 separate areas of partial thickness skin loss, traumatic  5.  Upper mid vertebral column with Unstageable Pressure Injury  Pressure Injury POA: Yes Measurement: 1.  R anterior lower leg full thickness 10 cm x 1.5 cm x 0.2 cm 90% red moist 10% dark clotted material lateral edge of wound  2.  L lower leg partial thickness 1 cm x 1 cm 100% pink and moist; to left of this wound are 3 linear scabbed dry areas  3.  L shoulder 1 cm x 1 cm partial thickness skin loss 100% pink and moist  4.  L mid back 4 cm x 4 cm and distal 3 cm x 3 cm partial thickness skin loss 100% pink and moist  5.  Upper mid vertebral column Unstageable Pressure Injury 0. 5 cm x 0.5 cm 100% yellow slough   Drainage (amount, consistency, odor) 1. Minimal serosanguinous to B lower legs, shoulder, left mid back wounds.  Minimal tan exudate from upper mid vertebral Unstageable PI  Periwound: patient with ecchymosis surrounding areas to L shoulder and left mid back; patient noted to have scattered bruising over body  Dressing procedure/placement/frequency:  Clean R lower leg, L lower leg, L shoulder, L mid back wounds with NS, apply Vaseline gauze cut to fit wound bed daily, cover with non-stick Telfa and Silicone foam. May lift foam daily to replace Vaseline gauze. Change foam dressing q3 days and prn soiling. SOAK VASELINE GAUZE WITH SALINE IF STUCK TO WOUND BED FOR ATRAUMATIC REMOVAL.  Clean Unstageable PI to mid vertebral column with NS, apply Medihoney to wound bed daily.  Cover with dry gauze and silicone foam. May lift foam dressing to apply Medihoney daily.  Change foam q3 days and prn soiling.   Patient is very thin and at  high risk for skin breakdown.  Patient would benefit from low air loss mattress to offload pressure of bony prominences.    POC discussed with bedside nurse. WOC team will not follow at this time. Re-consult if further needs arise.   Thank you,    Priscella Mann MSN, RN-BC, [employer] [phone]

## 2022-11-25 NOTE — Care Management Important Message (Signed)
Important Message  Patient Details  Name: Jacob Moses MRN: [id number] Date of Birth: 06-22-1927   Medicare Important Message Given:  N/A - LOS <3 / Initial given by admissions     [name] A Niam Nepomuceno 11/25/2022, 8:25 AM

## 2022-11-25 NOTE — NC FL2 (Signed)
[place] MEDICAID FL2 LEVEL OF CARE FORM     IDENTIFICATION  Patient Name: Jacob Moses Birthdate: February 21, 1928 Sex: male Admission Date (Current Location): 11/20/2022  [place] and [place] Number:  Chiropodist and Address:  [facility], [address] [place], [place] [address]      Provider Number: [id number]  Attending Physician Name and Address:  Darlin Priestly, MD  Relative Name and Phone Number:  Lupita Leash  [phone]    Current Level of Care: Hospital Recommended Level of Care: Skilled Nursing Facility Prior Approval Number:    Date Approved/Denied:   PASRR Number: [id number].  Discharge Plan: SNF    Current Diagnoses: Patient Active Problem List   Diagnosis Date Noted   Hyperkalemia 11/20/2022   Syncope 11/20/2022   Rhabdomyolysis 11/20/2022   Opacity of lung on imaging study 11/20/2022   Benign prostatic hyperplasia with lower urinary tract symptoms 01/01/2021   Sepsis due to gram-negative UTI (HCC) 12/14/2020   Sepsis (HCC) 12/14/2020   Bladder outlet obstruction 12/04/2020   Hyponatremia 12/03/2020   Closed right hip fracture (HCC) 11/29/2020   Fall at home, initial encounter 11/29/2020   Bradycardia 11/29/2020   HTN (hypertension) 11/29/2020   GERD (gastroesophageal reflux disease) 11/29/2020   Anxiety 11/29/2020   CKD (chronic kidney disease), stage IIIb 11/29/2020   Iron deficiency anemia 11/29/2020   Age-related osteoporosis without current pathological fracture 05/05/2020   Nondependent alcohol abuse, in remission 05/05/2020   Moderate aortic insufficiency 05/18/2019   Peripheral edema 04/06/2019   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) 02/28/2019   Insomnia, unspecified 02/25/2019   Protein-calorie malnutrition, severe 02/24/2019   SDH (subdural hematoma) (HCC) 02/22/2019    Orientation RESPIRATION BLADDER Height & Weight     Self, Place, Situation  Normal Incontinent, External catheter Weight:  137 lb 12.6 oz (62.5 kg) Height:  6\' 1"  (185.4 cm)  BEHAVIORAL SYMPTOMS/MOOD NEUROLOGICAL BOWEL NUTRITION STATUS      Continent Diet (see discharge summary)  AMBULATORY STATUS COMMUNICATION OF NEEDS Skin   Limited Assist Verbally Skin abrasions (abrasion leg, skin tear back)                       Personal Care Assistance Level of Assistance  Bathing, Feeding, Dressing, Total care Bathing Assistance: Limited assistance Feeding assistance: Independent Dressing Assistance: Limited assistance Total Care Assistance: Limited assistance   Functional Limitations Info  Sight, Hearing, Speech Sight Info: Adequate Hearing Info: Impaired Speech Info: Adequate    SPECIAL CARE FACTORS FREQUENCY  PT (By licensed PT), OT (By licensed OT)     PT Frequency: min 4x weekly OT Frequency: min 4x weekly            Contractures Contractures Info: Not present    Additional Factors Info  Code Status, Allergies Code Status Info: DNR Allergies Info: NKA           Current Medications (11/25/2022):  This is the current hospital active medication list Current Facility-Administered Medications  Medication Dose Route Frequency Provider Last Rate Last Admin   acetaminophen (TYLENOL) tablet 650 mg  650 mg Oral Q6H PRN Verdene Lennert, MD   650 mg at 11/22/22 1318   Or   acetaminophen (TYLENOL) suppository 650 mg  650 mg Rectal Q6H PRN Verdene Lennert, MD       ALPRAZolam Prudy Feeler) tablet 0.25 mg  0.25 mg Oral QHS PRN Darlin Priestly, MD       dextrose 50 % solution 50  mL  1 ampule Intravenous PRN Verdene Lennert, MD       enoxaparin (LOVENOX) injection 30 mg  30 mg Subcutaneous Q24H Verdene Lennert, MD   30 mg at 11/24/22 2020   feeding supplement (ENSURE ENLIVE / ENSURE PLUS) liquid 237 mL  237 mL Oral TID BM Darlin Priestly, MD   237 mL at 11/24/22 2021   haloperidol lactate (HALDOL) injection 2 mg  2 mg Intravenous Q6H PRN Darlin Priestly, MD   2 mg at 11/23/22 2008   hydrALAZINE (APRESOLINE) injection 10 mg   10 mg Intravenous Q6H PRN Darlin Priestly, MD   10 mg at 11/22/22 4696   leptospermum manuka honey (MEDIHONEY) paste 1 Application  1 Application Topical Daily Darlin Priestly, MD       meclizine (ANTIVERT) tablet 25 mg  25 mg Oral TID PRN Darlin Priestly, MD   25 mg at 11/22/22 1318   multivitamin with minerals tablet 1 tablet  1 tablet Oral Daily Darlin Priestly, MD   1 tablet at 11/25/22 0949   ondansetron (ZOFRAN) tablet 4 mg  4 mg Oral Q6H PRN Verdene Lennert, MD       Or   ondansetron (ZOFRAN) injection 4 mg  4 mg Intravenous Q6H PRN Verdene Lennert, MD   4 mg at 11/22/22 0802   polyethylene glycol (MIRALAX / GLYCOLAX) packet 17 g  17 g Oral Daily PRN Verdene Lennert, MD       QUEtiapine (SEROQUEL) tablet 50 mg  50 mg Oral QHS Darlin Priestly, MD   50 mg at 11/24/22 2020   sodium chloride flush (NS) 0.9 % injection 3 mL  3 mL Intravenous Q12H Verdene Lennert, MD   3 mL at 11/25/22 2952     Discharge Medications: Please see discharge summary for a list of discharge medications.  Relevant Imaging Results:  Relevant Lab Results:   Additional Information SSN: [id number]  Allena Katz, LCSW

## 2022-11-25 NOTE — Discharge Summary (Addendum)
Physician Discharge Summary   Jacob Moses  male DOB: August 07, 1927  ZOX:[id number]  PCP: Jaclyn Shaggy, MD  Admit date: 11/20/2022 Discharge date: 11/25/2022  Admitted From: home Disposition:  SNF rehab Daughter updated at bedside prior to discharge. CODE STATUS: DNR  Discharge Instructions     Discharge wound care:   Complete by: As directed    Wound care  Daily  1.         Clean R lower leg, L lower leg, L shoulder, L mid back wounds with NS, apply Vaseline gauze cut to fit wound bed daily, cover with non-stick Telfa and Silicone foam. May lift foam daily to replace Vaseline gauze. Change foam dressing q3 days and prn soiling. SOAK VASELINE GAUZE WITH SALINE IF STUCK TO WOUND BED FOR ATRAUMATIC REMOVAL.  2.         Clean Unstageable PI to mid vertebral column with NS, apply Medihoney to wound bed daily.  Cover with dry gauze and silicone foam. May lift foam dressing to apply Medihoney daily.  Change foam q3 days and prn soiling. [facility] Course:  For full details, please see H&P, progress notes, consult notes and ancillary notes.  Briefly,  Jacob Moses is a 89 y.o. male with medical history significant of hypertension, BPH with urinary retention, previous fall with SDH, CKD stage IIIa who presented to the ED due to a fall.    Per ED provider, pt reported that he fell and spent the night on the floor. EMS reported that they did find him on the floor after nursing staff checked on him in the morning. Patient denied any head injury or loss of consciousness but stated "my lights went out". EMS reported that his walker was not near him where they found him.    Questionable Syncope Dizzness Orthostasis  --unclear hx from the pt. --cardiology consulted, rec avoiding AV nodal blocker. --Pt was hypertensive on presentation, however, on 11/24/22, BP was 100's sitting and dropped to 83/48 with standing with reported dizziness.  Pt received gentle MIVF, and next day, BP  114/94 while standing without dizziness. --Pt advised to maintain oral hydration.  Weakness and fall --PT eval, rec SNF rehab   Hyperkalemia Patient has a history of hyperkalemia in the past.  Uncertain etiology at this time as patient's renal dysfunction is not so severe.  Rhabdomyolysis, however this is also mild.  He is not on any medications that can cause this.   --received Lokelma with improvement next day.  Potassium level normalized prior to discharge.    Bradycardia History of sinus bradycardia, with rates in the 50s on presentation. --cardiology consulted, rec avoiding AV nodal blocker.   HTN (hypertension) Patient was significantly hypertensive on presentation, however, found to have low normal BP and orthostasis on 7/22 with just amlodipine 10 mg daily. --d/c'ed home amlodipine   CKD (chronic kidney disease), stage IIIb Patient has a history of CKD, initially stage IIIa, however currently seems stage IIIb.  He has previously refused follow-up with nephrology.     Benign prostatic hyperplasia with lower urinary tract symptoms --not taking home finasteride and silodosin PTA.   Traumatic Rhabdomyolysis, mild --CK 664. - S/p IVF   Insomnia and anxiety  Patient is on Xanax 0.5 mg daily at home for insomnia and anxiety.  this may have contributed to his ground-level fall.  Daughter said pt has been on Xanax for many years, and so did not want it to  stop. --cont Xanax PRN   Hx of SDH (subdural hematoma) (HCC) Stable on CT imaging.  No further interventions at this point.   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    Hospital delirium --started seroquel nightly, to be continued for 5 more days after discharge.   Right LE wound, POA --had large skin tear on presentation, likely from the fall.   --wound RN consulted, with wound care and dressing change instructions included above.   Severe malnutrition in context of  social or environmental circumstances, Underweight  --Pt noted to have poor oral intake and oral hydration. --Ensure and vitamins.   --ordered scheduled water intake   Discharge Diagnoses:  Principal Problem:   Syncope Active Problems:   Hyperkalemia   Bradycardia   Opacity of lung on imaging study   HTN (hypertension)   CKD (chronic kidney disease), stage IIIb   Benign prostatic hyperplasia with lower urinary tract symptoms   Rhabdomyolysis   SDH (subdural hematoma) (HCC)   Anxiety   30 Day Unplanned Readmission Risk Score    Flowsheet Row ED to Hosp-Admission (Current) from 11/20/2022 in [facility] 1C MEDICAL TELEMETRY  30 Day Unplanned Readmission Risk Score (%) 21.07 Filed at 11/25/2022 0801       This score is the patient's risk of an unplanned readmission within 30 days of being discharged (0 -100%). The score is based on dignosis, age, lab data, medications, orders, and past utilization.   Low:  0-14.9   Medium: 15-21.9   High: 22-29.9   Extreme: 30 and above         Discharge Instructions:  Allergies as of 11/25/2022   No Known Allergies      Medication List     STOP taking these medications    amLODipine 5 MG tablet Commonly known as: NORVASC   finasteride 5 MG tablet Commonly known as: PROSCAR   silodosin 8 MG Caps capsule Commonly known as: RAPAFLO       TAKE these medications    acetaminophen 325 MG tablet Commonly known as: TYLENOL Take 2 tablets (650 mg total) by mouth every 6 (six) hours as needed for mild pain, moderate pain or headache. What changed:  medication strength how much to take when to take this reasons to take this   ALPRAZolam 0.5 MG 24 hr tablet Commonly known as: XANAX XR Take 1 tablet (0.5 mg total) by mouth at bedtime as needed for anxiety. Home med. What changed:  when to take this reasons to take this additional instructions   feeding supplement Liqd Take 237 mLs by mouth 3 (three)  times daily between meals.   free water Soln Take 200 mLs by mouth 3 (three) times daily.   leptospermum manuka honey Pste paste Apply 1 Application topically daily.   meclizine 25 MG tablet Commonly known as: ANTIVERT Take 1 tablet (25 mg total) by mouth 3 (three) times daily as needed for dizziness.   multivitamin with minerals tablet Take 1 tablet by mouth daily.   QUEtiapine 50 MG tablet Commonly known as: SEROQUEL Take 1 tablet (50 mg total) by mouth at bedtime for 5 days.               Discharge Care Instructions  (From admission, onward)           Start     Ordered   11/25/22 0000  Discharge wound care:       Comments:  Wound care  Daily  1.         Clean R lower leg, L lower leg, L shoulder, L mid back wounds with NS, apply Vaseline gauze cut to fit wound bed daily, cover with non-stick Telfa and Silicone foam. May lift foam daily to replace Vaseline gauze. Change foam dressing q3 days and prn soiling. SOAK VASELINE GAUZE WITH SALINE IF STUCK TO WOUND BED FOR ATRAUMATIC REMOVAL.  2.         Clean Unstageable PI to mid vertebral column with NS, apply Medihoney to wound bed daily.  Cover with dry gauze and silicone foam. May lift foam dressing to apply Medihoney daily.  Change foam q3 days and prn soiling. - -   11/25/22 1127             Follow-up Information     Jaclyn Shaggy, MD Follow up.   Specialty: Internal Medicine Contact information: [address]   [place] [address] [phone]                 No Known Allergies   The results of significant diagnostics from this hospitalization (including imaging, microbiology, ancillary and laboratory) are listed below for reference.   Consultations:   Procedures/Studies: ECHOCARDIOGRAM COMPLETE  Result Date: 11/21/2022    ECHOCARDIOGRAM REPORT   Patient Name:   JAMARD HARPIN Date of Exam: 11/21/2022 Medical Rec #:  [id number]       Height:       72.0 in Accession #:     [id number]      Weight:       138.6 lb Date of Birth:  Oct 22, 1927       BSA:          1.823 m Patient Age:    89 years        BP:           153/73 mmHg Patient Gender: M               HR:           53 bpm. Exam Location:  [facility] Procedure: 2D Echo, Cardiac Doppler and Color Doppler Indications:     Syncope R55  History:         Patient has no prior history of Echocardiogram examinations.                  Risk Factors:Hypertension.  Sonographer:     Cristela Blue Referring Phys:  [id number] Verdene Lennert Diagnosing Phys: Alwyn Pea MD  Sonographer Comments: Image acquisition challenging due to patient body habitus and Pt has pectus excavatum and very bony thorax. IMPRESSIONS  1. Left ventricular ejection fraction, by estimation, is 60 to 65%. The left ventricle has normal function. The left ventricle has no regional wall motion abnormalities. There is moderate concentric left ventricular hypertrophy. Left ventricular diastolic parameters are consistent with Grade I diastolic dysfunction (impaired relaxation).  2. Right ventricular systolic function is normal. The right ventricular size is normal.  3. The mitral valve is grossly normal. Trivial mitral valve regurgitation.  4. The aortic valve is normal in structure. Aortic valve regurgitation is mild. FINDINGS  Left Ventricle: Left ventricular ejection fraction, by estimation, is 60 to 65%. The left ventricle has normal function. The left ventricle has no regional wall motion abnormalities. The left ventricular internal cavity size was normal in size. There is  moderate concentric left ventricular hypertrophy. Left ventricular diastolic parameters are consistent with  Grade I diastolic dysfunction (impaired relaxation). Right Ventricle: The right ventricular size is normal. No increase in right ventricular wall thickness. Right ventricular systolic function is normal. Left Atrium: Left atrial size was normal in size. Right Atrium: Right atrial size was not assessed.  Pericardium: There is no evidence of pericardial effusion. Mitral Valve: The mitral valve is grossly normal. Trivial mitral valve regurgitation. MV peak gradient, 7.4 mmHg. The mean mitral valve gradient is 2.0 mmHg. Tricuspid Valve: The tricuspid valve is grossly normal. Tricuspid valve regurgitation is trivial. Aortic Valve: The aortic valve is normal in structure. Aortic valve regurgitation is mild. Aortic valve mean gradient measures 2.5 mmHg. Aortic valve peak gradient measures 4.6 mmHg. Aortic valve area, by VTI measures 3.42 cm. Pulmonic Valve: The pulmonic valve was normal in structure. Pulmonic valve regurgitation is not visualized. Aorta: The ascending aorta was not well visualized. IAS/Shunts: No atrial level shunt detected by color flow Doppler.  LEFT VENTRICLE PLAX 2D LVIDd:         3.60 cm   Diastology LVIDs:         2.40 cm   LV e' medial:    3.81 cm/s LV PW:         1.70 cm   LV E/e' medial:  15.1 LV IVS:        1.30 cm   LV e' lateral:   3.92 cm/s LVOT diam:     2.10 cm   LV E/e' lateral: 14.7 LV SV:         72 LV SV Index:   40 LVOT Area:     3.46 cm  RIGHT VENTRICLE RV Basal diam:  3.30 cm RV Mid diam:    2.60 cm RV S prime:     8.70 cm/s TAPSE (M-mode): 1.4 cm LEFT ATRIUM           Index        RIGHT ATRIUM           Index LA diam:      3.80 cm 2.08 cm/m   RA Area:     14.70 cm LA Vol (A2C): 15.6 ml 8.56 ml/m   RA Volume:   35.00 ml  19.20 ml/m LA Vol (A4C): 28.4 ml 15.58 ml/m  AORTIC VALVE AV Area (Vmax):    3.03 cm AV Area (Vmean):   2.83 cm AV Area (VTI):     3.42 cm AV Vmax:           106.85 cm/s AV Vmean:          72.900 cm/s AV VTI:            0.212 m AV Peak Grad:      4.6 mmHg AV Mean Grad:      2.5 mmHg LVOT Vmax:         93.40 cm/s LVOT Vmean:        59.600 cm/s LVOT VTI:          0.209 m LVOT/AV VTI ratio: 0.99  AORTA Ao Root diam: 2.50 cm MITRAL VALVE                TRICUSPID VALVE MV Area (PHT): 1.91 cm     TR Peak grad:   18.0 mmHg MV Area VTI:   1.95 cm     TR Vmax:         212.00 cm/s MV Peak grad:  7.4 mmHg MV Mean grad:  2.0 mmHg     SHUNTS MV Vmax:  1.36 m/s     Systemic VTI:  0.21 m MV Vmean:      60.3 cm/s    Systemic Diam: 2.10 cm MV Decel Time: 398 msec MV E velocity: 57.60 cm/s MV A velocity: 114.00 cm/s MV E/A ratio:  0.51 Dwayne D Callwood MD Electronically signed by Alwyn Pea MD Signature Date/Time: 11/21/2022/2:52:05 PM    Final    CT CHEST WO CONTRAST  Result Date: 11/20/2022 CLINICAL DATA:  Respiratory illness, recent fall EXAM: CT CHEST WITHOUT CONTRAST TECHNIQUE: Multidetector CT imaging of the chest was performed following the standard protocol without IV contrast. RADIATION DOSE REDUCTION: This exam was performed according to the departmental dose-optimization program which includes automated exposure control, adjustment of the mA and/or kV according to patient size and/or use of iterative reconstruction technique. COMPARISON:  Previous studies including chest radiograph done today FINDINGS: Cardiovascular: Heart is enlarged in size. Coronary artery calcifications are seen. Calcifications are seen in thoracic aorta and its major branches. There is ectasia of ascending thoracic aorta measuring 4.1 cm. There is ectasia of left side of the aortic arch measuring 4.1 cm. Midportion of the aortic arch measures 3.2 cm. Descending thoracic aorta measures 3.2 cm. Main pulmonary artery measures 4.1 cm. Mediastinum/Nodes: There are a few subcentimeter nodes seen mediastinum, possibly reactive hyperplasia. Thyroid is smaller than usual in size. Lungs/Pleura: There is no focal pulmonary consolidation. Breathing motion limits evaluation of lower lung fields. Small linear densities are seen in the posterior lower lung fields suggesting scarring or subsegmental atelectasis. Left hemidiaphragm is elevated. There is calcified nodule in lingula. There is no pleural effusion or pneumothorax. Upper Abdomen: Calcifications are seen in abdominal aorta and its major  branches. Surgical clips are noted along the lesser curvature aspect of stomach. High density in the lumen of stomach may be Oral medication. Musculoskeletal: There is a fusion of anterior aspects of multiple thoracic vertebral bodies suggesting possible ankylosing spondylitis. Similar finding is also noted in the visualized lower cervical spine and upper lumbar spine. IMPRESSION: There is no focal pulmonary consolidation. There is no pleural effusion or pneumothorax. Small linear densities in the lower lung fields may suggest scarring or subsegmental atelectasis. Part of this finding may be due to motion artifacts. There is no pleural effusion or pneumothorax. Cardiomegaly. Coronary artery disease. Aortic arteriosclerosis. There is ectasia of the main pulmonary artery suggesting pulmonary arterial hypertension. 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 Recommend annual imaging followup by CTA or MRA. This recommendation follows 2010 ACCF/AHA/AATS/ACR/ASA/SCA/SCAI/SIR/STS/SVM Guidelines for the Diagnosis and Management of Patients with Thoracic Aortic Disease. Circulation. 2010; 121: K440-N027. Aortic aneurysm NOS (ICD10-I71.9) Other findings as described in the body of the report. Electronically Signed   By: Ernie Avena M.D.   On: 11/20/2022 15:25   DG Chest Port 1 View  Result Date: 11/20/2022 CLINICAL DATA:  Hyperkalemia, mild rhabdomyolysis, possible syncopal episode. EXAM: PORTABLE CHEST 1 VIEW COMPARISON:  CXR 12/13/20 FINDINGS: No pleural effusion. No pneumothorax. Normal cardiac and mediastinal contours. No radiographically apparent displaced rib fractures. There is hazy opacity in the right perihilar region that could represent atelectasis or infection. No radiographically apparent displaced rib fractures. Visualized upper abdomen is unremarkable. IMPRESSION: Hazy opacity in the right perihilar region could represent  atelectasis or infection. Electronically Signed   By: Lorenza Cambridge M.D.   On: 11/20/2022 12:41   CT Head Wo Contrast  Result Date: 11/20/2022 CLINICAL  DATA:  Blunt poly trauma EXAM: CT HEAD WITHOUT CONTRAST CT CERVICAL SPINE WITHOUT CONTRAST TECHNIQUE: Multidetector CT imaging of the head and cervical spine was performed following the standard protocol without intravenous contrast. Multiplanar CT image reconstructions of the cervical spine were also generated. RADIATION DOSE REDUCTION: This exam was performed according to the departmental dose-optimization program which includes automated exposure control, adjustment of the mA and/or kV according to patient size and/or use of iterative reconstruction technique. COMPARISON:  11/29/2020 FINDINGS: CT HEAD FINDINGS Brain: Intermediate to low-density subdural collection along the left cerebral convexity measuring up to 5 mm in thickness on coronal reformats. No significant mass effect on the atrophic brain. Chronic small vessel ischemia in the cerebral white matter. No evidence of acute infarct, hydrocephalus, or mass. Vascular: No hyperdense vessel or unexpected calcification. Skull: Negative for fracture Sinuses/Orbits: No visible injury CT CERVICAL SPINE FINDINGS Alignment: No traumatic malalignment.  Mild fused dextrocurvature. Skull base and vertebrae: No acute fracture or focal bone lesion. Soft tissues and spinal canal: No prevertebral fluid or swelling. No visible canal hematoma. Disc levels: Generalized cervical spine ankylosis from bridging osteophytes/syndesmophytes spanning C3-T2. The open levels show degenerative joint space narrowing and ridging. Upper chest: No evidence of injury IMPRESSION: 1. Low-density/nonacute subdural hematoma along the left cerebral convexity measuring up to 5 mm in thickness. No associated mass effect 2. Negative for cervical spine fracture or subluxation. Extensive cervicothoracic ankylosis. Electronically Signed   By:  Tiburcio Pea M.D.   On: 11/20/2022 08:06   CT Cervical Spine Wo Contrast  Result Date: 11/20/2022 CLINICAL DATA:  Blunt poly trauma EXAM: CT HEAD WITHOUT CONTRAST CT CERVICAL SPINE WITHOUT CONTRAST TECHNIQUE: Multidetector CT imaging of the head and cervical spine was performed following the standard protocol without intravenous contrast. Multiplanar CT image reconstructions of the cervical spine were also generated. RADIATION DOSE REDUCTION: This exam was performed according to the departmental dose-optimization program which includes automated exposure control, adjustment of the mA and/or kV according to patient size and/or use of iterative reconstruction technique. COMPARISON:  11/29/2020 FINDINGS: CT HEAD FINDINGS Brain: Intermediate to low-density subdural collection along the left cerebral convexity measuring up to 5 mm in thickness on coronal reformats. No significant mass effect on the atrophic brain. Chronic small vessel ischemia in the cerebral white matter. No evidence of acute infarct, hydrocephalus, or mass. Vascular: No hyperdense vessel or unexpected calcification. Skull: Negative for fracture Sinuses/Orbits: No visible injury CT CERVICAL SPINE FINDINGS Alignment: No traumatic malalignment.  Mild fused dextrocurvature. Skull base and vertebrae: No acute fracture or focal bone lesion. Soft tissues and spinal canal: No prevertebral fluid or swelling. No visible canal hematoma. Disc levels: Generalized cervical spine ankylosis from bridging osteophytes/syndesmophytes spanning C3-T2. The open levels show degenerative joint space narrowing and ridging. Upper chest: No evidence of injury IMPRESSION: 1. Low-density/nonacute subdural hematoma along the left cerebral convexity measuring up to 5 mm in thickness. No associated mass effect 2. Negative for cervical spine fracture or subluxation. Extensive cervicothoracic ankylosis. Electronically Signed   By: Tiburcio Pea M.D.   On: 11/20/2022 08:06       Labs: BNP (last 3 results) No results for input(s): "BNP" in the last 8760 hours. Basic Metabolic Panel: Recent Labs  Lab 11/21/22 0859 11/22/22 0453 11/23/22 0750 11/24/22 0627 11/25/22 0456  NA 134* 133* 135 137 132*  K 5.7* 4.3 4.9 4.5 4.4  CL 101 102 100 102 100  CO2 24 24 24 25 25   GLUCOSE 100* 118* 105* 108* 109*  BUN 35* 32* 39* 46* 59*  CREATININE 1.69* 1.40* 1.78* 1.94* 1.92*  CALCIUM 8.7* 8.7* 9.0 8.5* 8.1*  MG  --  2.5* 2.7* 2.9* 2.8*   Liver Function Tests: Recent Labs  Lab 11/20/22 0732  AST 30  ALT 17  ALKPHOS 77  BILITOT 0.6  PROT 7.6  ALBUMIN 4.3   No results for input(s): "LIPASE", "AMYLASE" in the last 168 hours. No results for input(s): "AMMONIA" in the last 168 hours. CBC: Recent Labs  Lab 11/20/22 0732 11/21/22 0859 11/22/22 0453 11/23/22 0750 11/24/22 0627 11/25/22 0456  WBC 9.5 8.2 10.4 11.8* 10.6* 12.9*  NEUTROABS 7.0  --   --   --   --   --   HGB 12.7* 12.2* 12.9* 13.9 12.5* 11.2*  HCT 39.3 38.3* 38.4* 42.2 36.9* 33.9*  MCV 97.3 98.0 94.8 94.8 95.3 95.5  PLT 254 240 226 244 242 221   Cardiac Enzymes: Recent Labs  Lab 11/20/22 0732 11/20/22 1037  CKTOTAL 664* 587*   BNP: Invalid input(s): "POCBNP" CBG: Recent Labs  Lab 11/22/22 0528 11/24/22 0651 11/24/22 0825 11/24/22 2021 11/25/22 0748  GLUCAP 115* 110* 174* 166* 113*   D-Dimer No results for input(s): "DDIMER" in the last 72 hours. Hgb A1c No results for input(s): "HGBA1C" in the last 72 hours. Lipid Profile No results for input(s): "CHOL", "HDL", "LDLCALC", "TRIG", "CHOLHDL", "LDLDIRECT" in the last 72 hours. Thyroid function studies No results for input(s): "TSH", "T4TOTAL", "T3FREE", "THYROIDAB" in the last 72 hours.  Invalid input(s): "FREET3" Anemia work up No results for input(s): "VITAMINB12", "FOLATE", "FERRITIN", "TIBC", "IRON", "RETICCTPCT" in the last 72 hours. Urinalysis    Component Value Date/Time   COLORURINE AMBER (A) 12/13/2020 2341    APPEARANCEUR Cloudy (A) 11/14/2021 1015   LABSPEC 1.012 12/13/2020 2341   PHURINE 8.0 12/13/2020 2341   GLUCOSEU Negative 11/14/2021 1015   HGBUR LARGE (A) 12/13/2020 2341   BILIRUBINUR Negative 11/14/2021 1015   KETONESUR NEGATIVE 12/13/2020 2341   PROTEINUR 2+ (A) 11/14/2021 1015   PROTEINUR 100 (A) 12/13/2020 2341   NITRITE Negative 11/14/2021 1015   NITRITE POSITIVE (A) 12/13/2020 2341   LEUKOCYTESUR 3+ (A) 11/14/2021 1015   LEUKOCYTESUR LARGE (A) 12/13/2020 2341   Sepsis Labs Recent Labs  Lab 11/22/22 0453 11/23/22 0750 11/24/22 0627 11/25/22 0456  WBC 10.4 11.8* 10.6* 12.9*   Microbiology No results found for this or any previous visit (from the past 240 hour(s)).   Total time spend on discharging this patient, including the last patient exam, discussing the hospital stay, instructions for ongoing care as it relates to all pertinent caregivers, as well as preparing the medical discharge records, prescriptions, and/or referrals as applicable, is 35 minutes.    Darlin Priestly, MD  [facility] 11/25/2022, 11:28 AM

## 2022-11-25 NOTE — Progress Notes (Signed)
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 PMH significant for HOH, HTN, insomnia, hx of stomach ulcers, vertigo   OT comments  Pt received semi-reclined. Appearing alert; willing to work with OT on transfer to chair for breakfast. T/f MIN A with elevated bed to standing; CGA with RW sidestepping to chair. Orthostatics taken; pt not dizzy and not orthostatic today. See flowsheet below for further details of session. Left in chair, RN and daughter in room, with all needs in reach.  Patient will benefit from continued OT while in acute care.    Recommendations for follow up therapy are one component of a multi-disciplinary discharge planning process, led by the attending physician.  Recommendations may be updated based on patient status, additional functional criteria and insurance authorization.    Assistance Recommended at Discharge Intermittent Supervision/Assistance  Patient can return home with the following  A little help with walking and/or transfers;A little help with bathing/dressing/bathroom;Direct supervision/assist for medications management;Assist for transportation;Help with stairs or ramp for entrance   Equipment Recommendations  Other (comment) (defer to next venue of care)    Recommendations for Other Services      Precautions / Restrictions Precautions Precautions: Fall Precaution Comments: watch orthostatics; was fine 7/23 with OT session, but has hx of orthostatic hypotension Restrictions Weight Bearing Restrictions: No       Mobility Bed Mobility Overal bed mobility: Needs Assistance Bed Mobility: Supine to  Sit     Supine to sit: Supervision (cues)          Transfers Overall transfer level: Needs assistance Equipment used: Rolling walker (2 wheels) Transfers: Sit to/from Stand Sit to Stand: Min assist, From elevated surface (tried once from low bed; unable to t/f; was able to on second attempt with bed elevated)           General transfer comment: Pt able to sidestep with CGA with RW approx 3 feet to recliner; able to stand for orthostatics to be taken; pt denies dizziness     Balance Overall balance assessment: Needs assistance Sitting-balance support: Feet supported Sitting balance-Leahy Scale: Good     Standing balance support: Bilateral upper extremity supported, During functional activity, Reliant on assistive device for balance Standing balance-Leahy Scale: Poor                             ADL either performed or assessed with clinical judgement   ADL Overall ADL's : Needs assistance/impaired                                       General ADL Comments: did not participate in ADLs this session.    Extremity/Trunk Assessment Upper Extremity Assessment Upper Extremity Assessment: Generalized weakness   Lower Extremity Assessment Lower Extremity Assessment: Generalized weakness        Vision       Perception     Praxis      Cognition Arousal/Alertness: Awake/alert Behavior During Therapy: WFL for tasks assessed/performed Overall Cognitive Status: Within Functional Limits for tasks assessed  General Comments: follows all cues; awake and alert. Really wanting breakfast. Misremembering some details about when he last ate (daughter in room correcting pt).        Exercises      Shoulder Instructions       General Comments Orthostatics taken during session. Semi=reclined BP 130/75; seated 121/75; standing 114/94. Pt denies dizziness throughout session.    Pertinent Vitals/  Pain       Pain Assessment Pain Assessment: No/denies pain  Home Living                                          Prior Functioning/Environment              Frequency  Min 1X/week        Progress Toward Goals  OT Goals(current goals can now be found in the care plan section)  Progress towards OT goals: Progressing toward goals  Acute Rehab OT Goals Patient Stated Goal: Get better; eat breakfast OT Goal Formulation: With patient Time For Goal Achievement: 12/05/22 Potential to Achieve Goals: Fair ADL Goals Pt Will Perform Grooming: with modified independence Pt Will Perform Lower Body Dressing: with modified independence Pt Will Transfer to Toilet: with modified independence Pt Will Perform Toileting - Clothing Manipulation and hygiene: with modified independence  Plan Discharge plan remains appropriate;Frequency remains appropriate    Co-evaluation                 AM-PAC OT "6 Clicks" Daily Activity     Outcome Measure   Help from another person eating meals?: None Help from another person taking care of personal grooming?: A Little Help from another person toileting, which includes using toliet, bedpan, or urinal?: A Little Help from another person bathing (including washing, rinsing, drying)?: A Lot Help from another person to put on and taking off regular upper body clothing?: A Little Help from another person to put on and taking off regular lower body clothing?: A Lot 6 Click Score: 17    End of Session Equipment Utilized During Treatment: Gait belt;Rolling walker (2 wheels)  OT Visit Diagnosis: Muscle weakness (generalized) (M62.81);Other abnormalities of gait and mobility (R26.89)   Activity Tolerance Patient tolerated treatment well   Patient Left in chair;with call bell/phone within reach;with nursing/sitter in room;with family/visitor present;Other (comment) (RN aware of need to start chair alarm)   Nurse Communication  Mobility status        Time: 2536-6440 OT Time Calculation (min): 16 min  Charges: OT General Charges $OT Visit: 1 Visit OT Treatments $Therapeutic Activity: 8-22 mins  Linward Foster, MS, OTR/L   Alvester Morin 11/25/2022, 9:58 AM

## 2023-02-25 ENCOUNTER — Other Ambulatory Visit: Payer: Self-pay

## 2023-02-25 ENCOUNTER — Emergency Department: Payer: Medicare Other

## 2023-02-25 ENCOUNTER — Inpatient Hospital Stay
Admission: EM | Admit: 2023-02-25 | Discharge: 2023-03-03 | DRG: 092 | Disposition: A | Discharge status: Skilled Nursing Facility | Payer: Medicare Other | Source: Other Acute Inpatient Hospital | Attending: Student | Admitting: Student

## 2023-02-25 DIAGNOSIS — S0001XA Abrasion of scalp, initial encounter: Secondary | ICD-10-CM | POA: Diagnosis present

## 2023-02-25 DIAGNOSIS — Z23 Encounter for immunization: Secondary | ICD-10-CM | POA: Diagnosis present

## 2023-02-25 DIAGNOSIS — S065XAA Traumatic subdural hemorrhage with loss of consciousness status unknown, initial encounter: Secondary | ICD-10-CM | POA: Diagnosis present

## 2023-02-25 DIAGNOSIS — E559 Vitamin D deficiency, unspecified: Secondary | ICD-10-CM | POA: Diagnosis present

## 2023-02-25 DIAGNOSIS — K219 Gastro-esophageal reflux disease without esophagitis: Secondary | ICD-10-CM | POA: Diagnosis present

## 2023-02-25 DIAGNOSIS — Z66 Do not resuscitate: Secondary | ICD-10-CM | POA: Diagnosis present

## 2023-02-25 DIAGNOSIS — R296 Repeated falls: Secondary | ICD-10-CM | POA: Diagnosis present

## 2023-02-25 DIAGNOSIS — S80811A Abrasion, right lower leg, initial encounter: Principal | ICD-10-CM | POA: Diagnosis present

## 2023-02-25 DIAGNOSIS — S0990XA Unspecified injury of head, initial encounter: Secondary | ICD-10-CM

## 2023-02-25 DIAGNOSIS — Z87891 Personal history of nicotine dependence: Secondary | ICD-10-CM | POA: Diagnosis not present

## 2023-02-25 DIAGNOSIS — W19XXXA Unspecified fall, initial encounter: Principal | ICD-10-CM | POA: Diagnosis present

## 2023-02-25 DIAGNOSIS — L89891 Pressure ulcer of other site, stage 1: Secondary | ICD-10-CM | POA: Diagnosis present

## 2023-02-25 DIAGNOSIS — R748 Abnormal levels of other serum enzymes: Secondary | ICD-10-CM | POA: Diagnosis present

## 2023-02-25 DIAGNOSIS — T148XXA Other injury of unspecified body region, initial encounter: Secondary | ICD-10-CM

## 2023-02-25 DIAGNOSIS — L0390 Cellulitis, unspecified: Secondary | ICD-10-CM

## 2023-02-25 DIAGNOSIS — Z809 Family history of malignant neoplasm, unspecified: Secondary | ICD-10-CM

## 2023-02-25 DIAGNOSIS — S065XAD Traumatic subdural hemorrhage with loss of consciousness status unknown, subsequent encounter: Secondary | ICD-10-CM | POA: Diagnosis not present

## 2023-02-25 DIAGNOSIS — E611 Iron deficiency: Secondary | ICD-10-CM | POA: Diagnosis present

## 2023-02-25 DIAGNOSIS — R636 Underweight: Secondary | ICD-10-CM | POA: Diagnosis present

## 2023-02-25 DIAGNOSIS — Z96641 Presence of right artificial hip joint: Secondary | ICD-10-CM | POA: Diagnosis present

## 2023-02-25 DIAGNOSIS — Z833 Family history of diabetes mellitus: Secondary | ICD-10-CM

## 2023-02-25 DIAGNOSIS — L03115 Cellulitis of right lower limb: Secondary | ICD-10-CM | POA: Diagnosis present

## 2023-02-25 DIAGNOSIS — Z79899 Other long term (current) drug therapy: Secondary | ICD-10-CM

## 2023-02-25 DIAGNOSIS — M25532 Pain in left wrist: Secondary | ICD-10-CM | POA: Diagnosis present

## 2023-02-25 DIAGNOSIS — I10 Essential (primary) hypertension: Secondary | ICD-10-CM | POA: Diagnosis present

## 2023-02-25 DIAGNOSIS — N183 Chronic kidney disease, stage 3 unspecified: Secondary | ICD-10-CM | POA: Diagnosis present

## 2023-02-25 DIAGNOSIS — Z8711 Personal history of peptic ulcer disease: Secondary | ICD-10-CM

## 2023-02-25 DIAGNOSIS — N400 Enlarged prostate without lower urinary tract symptoms: Secondary | ICD-10-CM | POA: Diagnosis present

## 2023-02-25 DIAGNOSIS — F0390 Unspecified dementia without behavioral disturbance: Secondary | ICD-10-CM | POA: Diagnosis present

## 2023-02-25 DIAGNOSIS — I129 Hypertensive chronic kidney disease with stage 1 through stage 4 chronic kidney disease, or unspecified chronic kidney disease: Secondary | ICD-10-CM | POA: Diagnosis present

## 2023-02-25 DIAGNOSIS — M25522 Pain in left elbow: Secondary | ICD-10-CM | POA: Diagnosis present

## 2023-02-25 DIAGNOSIS — Z681 Body mass index (BMI) 19 or less, adult: Secondary | ICD-10-CM

## 2023-02-25 DIAGNOSIS — E039 Hypothyroidism, unspecified: Secondary | ICD-10-CM | POA: Diagnosis present

## 2023-02-25 DIAGNOSIS — E875 Hyperkalemia: Secondary | ICD-10-CM | POA: Diagnosis present

## 2023-02-25 DIAGNOSIS — Z79890 Hormone replacement therapy (postmenopausal): Secondary | ICD-10-CM

## 2023-02-25 LAB — COMPREHENSIVE METABOLIC PANEL
ALT: 16 U/L (ref 0–44)
AST: 26 U/L (ref 15–41)
Albumin: 4.1 g/dL (ref 3.5–5.0)
Alkaline Phosphatase: 70 U/L (ref 38–126)
Anion gap: 8 (ref 5–15)
BUN: 39 mg/dL — ABNORMAL HIGH (ref 8–23)
CO2: 23 mmol/L (ref 22–32)
Calcium: 8.7 mg/dL — ABNORMAL LOW (ref 8.9–10.3)
Chloride: 105 mmol/L (ref 98–111)
Creatinine, Ser: 1.64 mg/dL — ABNORMAL HIGH (ref 0.61–1.24)
GFR, Estimated: 39 mL/min — ABNORMAL LOW (ref >60)
Glucose, Bld: 99 mg/dL (ref 70–99)
Potassium: 5.3 mmol/L — ABNORMAL HIGH (ref 3.5–5.1)
Sodium: 136 mmol/L (ref 135–145)
Total Bilirubin: 0.7 mg/dL (ref 0.3–1.2)
Total Protein: 7 g/dL (ref 6.5–8.1)

## 2023-02-25 LAB — URINALYSIS, ROUTINE W REFLEX MICROSCOPIC
Bacteria, UA: NONE SEEN
Bilirubin Urine: NEGATIVE
Glucose, UA: NEGATIVE mg/dL
Ketones, ur: NEGATIVE mg/dL
Leukocytes,Ua: NEGATIVE
Nitrite: NEGATIVE
Protein, ur: 100 mg/dL — AB
Specific Gravity, Urine: 1.01 (ref 1.005–1.030)
Squamous Epithelial / HPF: 0 /[HPF] (ref 0–5)
pH: 6 (ref 5.0–8.0)

## 2023-02-25 LAB — BASIC METABOLIC PANEL
Anion gap: 11 (ref 5–15)
BUN: 35 mg/dL — ABNORMAL HIGH (ref 8–23)
CO2: 21 mmol/L — ABNORMAL LOW (ref 22–32)
Calcium: 8.5 mg/dL — ABNORMAL LOW (ref 8.9–10.3)
Chloride: 104 mmol/L (ref 98–111)
Creatinine, Ser: 1.56 mg/dL — ABNORMAL HIGH (ref 0.61–1.24)
GFR, Estimated: 41 mL/min — ABNORMAL LOW (ref >60)
Glucose, Bld: 117 mg/dL — ABNORMAL HIGH (ref 70–99)
Potassium: 5 mmol/L (ref 3.5–5.1)
Sodium: 136 mmol/L (ref 135–145)

## 2023-02-25 LAB — CBC WITH DIFFERENTIAL/PLATELET
Abs Immature Granulocytes: 0.05 10*3/uL (ref 0.00–0.07)
Basophils Absolute: 0 10*3/uL (ref 0.0–0.1)
Basophils Relative: 0 %
Eosinophils Absolute: 0.1 10*3/uL (ref 0.0–0.5)
Eosinophils Relative: 1 %
HCT: 33.2 % — ABNORMAL LOW (ref 39.0–52.0)
Hemoglobin: 10.7 g/dL — ABNORMAL LOW (ref 13.0–17.0)
Immature Granulocytes: 0 %
Lymphocytes Relative: 9 %
Lymphs Abs: 1.1 10*3/uL (ref 0.7–4.0)
MCH: 31.6 pg (ref 26.0–34.0)
MCHC: 32.2 g/dL (ref 30.0–36.0)
MCV: 97.9 fL (ref 80.0–100.0)
Monocytes Absolute: 1.3 10*3/uL — ABNORMAL HIGH (ref 0.1–1.0)
Monocytes Relative: 10 %
Neutro Abs: 9.9 10*3/uL — ABNORMAL HIGH (ref 1.7–7.7)
Neutrophils Relative %: 80 %
Platelets: 240 10*3/uL (ref 150–400)
RBC: 3.39 MIL/uL — ABNORMAL LOW (ref 4.22–5.81)
RDW: 13.1 % (ref 11.5–15.5)
WBC: 12.4 10*3/uL — ABNORMAL HIGH (ref 4.0–10.5)
nRBC: 0 % (ref 0.0–0.2)

## 2023-02-25 LAB — PROTIME-INR
INR: 1.1 (ref 0.8–1.2)
Prothrombin Time: 14.3 s (ref 11.4–15.2)

## 2023-02-25 LAB — TROPONIN I (HIGH SENSITIVITY): Troponin I (High Sensitivity): 31 ng/L — ABNORMAL HIGH (ref <18)

## 2023-02-25 LAB — CK: Total CK: 372 U/L (ref 49–397)

## 2023-02-25 LAB — LACTIC ACID, PLASMA: Lactic Acid, Venous: 1.1 mmol/L (ref 0.5–1.9)

## 2023-02-25 MED ORDER — FUROSEMIDE 10 MG/ML IJ SOLN
20.0000 mg | Freq: Once | INTRAMUSCULAR | Status: AC
Start: 2023-02-25 — End: 2023-02-25
  Administered 2023-02-25: 20 mg via INTRAVENOUS
  Filled 2023-02-25: qty 4

## 2023-02-25 MED ORDER — SODIUM CHLORIDE 0.9 % IV BOLUS
500.0000 mL | Freq: Once | INTRAVENOUS | Status: AC
  Administered 2023-02-25: 500 mL via INTRAVENOUS

## 2023-02-25 MED ORDER — ONDANSETRON HCL 4 MG/2ML IJ SOLN
4.0000 mg | Freq: Four times a day (QID) | INTRAMUSCULAR | Status: DC | PRN
  Administered 2023-02-26: 4 mg via INTRAVENOUS
  Filled 2023-02-25: qty 2

## 2023-02-25 MED ORDER — ENOXAPARIN SODIUM 30 MG/0.3ML IJ SOSY
30.0000 mg | PREFILLED_SYRINGE | INTRAMUSCULAR | Status: DC
  Administered 2023-02-25 – 2023-02-28 (×4): 30 mg via SUBCUTANEOUS
  Filled 2023-02-25 (×4): qty 0.3

## 2023-02-25 MED ORDER — ENOXAPARIN SODIUM 40 MG/0.4ML IJ SOSY: 40.0000 mg | PREFILLED_SYRINGE | INTRAMUSCULAR | Status: DC

## 2023-02-25 MED ORDER — BACITRACIN ZINC 500 UNIT/GM EX OINT
TOPICAL_OINTMENT | Freq: Once | CUTANEOUS | Status: AC
  Administered 2023-02-25: 1 via TOPICAL
  Filled 2023-02-25: qty 1.8

## 2023-02-25 MED ORDER — ACETAMINOPHEN 325 MG PO TABS
650.0000 mg | ORAL_TABLET | Freq: Four times a day (QID) | ORAL | Status: DC | PRN
  Administered 2023-02-25 – 2023-03-03 (×3): 650 mg via ORAL
  Filled 2023-02-25 (×3): qty 2

## 2023-02-25 MED ORDER — ACETAMINOPHEN 500 MG PO TABS
1000.0000 mg | ORAL_TABLET | Freq: Once | ORAL | Status: AC
  Administered 2023-02-25: 1000 mg via ORAL
  Filled 2023-02-25: qty 2

## 2023-02-25 MED ORDER — SODIUM CHLORIDE 0.9 % IV SOLN
2.0000 g | INTRAVENOUS | Status: DC
  Administered 2023-02-25: 2 g via INTRAVENOUS
  Filled 2023-02-25: qty 20

## 2023-02-25 MED ORDER — ONDANSETRON HCL 4 MG PO TABS
4.0000 mg | ORAL_TABLET | Freq: Four times a day (QID) | ORAL | Status: DC | PRN
  Filled 2023-02-25: qty 1

## 2023-02-25 MED ORDER — SODIUM CHLORIDE 0.9 % IV SOLN
1.0000 g | INTRAVENOUS | Status: AC
  Administered 2023-02-26 – 2023-03-03 (×6): 1 g via INTRAVENOUS
  Filled 2023-02-25 (×6): qty 10

## 2023-02-25 MED ORDER — TETANUS-DIPHTH-ACELL PERTUSSIS 5-2.5-18.5 LF-MCG/0.5 IM SUSY
0.5000 mL | PREFILLED_SYRINGE | Freq: Once | INTRAMUSCULAR | Status: AC
  Administered 2023-02-25: 0.5 mL via INTRAMUSCULAR
  Filled 2023-02-25: qty 0.5

## 2023-02-25 NOTE — ED Provider Notes (Signed)
[facility] Provider Note    Event Date/Time   First MD Initiated Contact with Patient 02/25/23 [phone]     (approximate)   History   Fall   HPI  Jacob Moses is a 89 y.o. male with history of hypertension, insomnia, BPH, CKD, prior subdural hematoma who presents to the emergency department after a fall.  Patient states he was trying to get out of bed around 11 PM last night and his right leg gave out on him and he fell to the ground.  He states he laid on the floor until about 4:00 in the morning until someone could help him get up.  He is complaining of left wrist and elbow pain.  Also has a skin tear and tenderness over the left ribs.  Has an abrasion to the top of his head.  Denies being on blood thinners.  Denies neck or back pain.  Denies any preceding symptoms that led to his fall.  States he lives in an independent living [facility]).  He normally ambulates with a cane or a walker.   History provided by patient, EMS.    Past Medical History:  Diagnosis Date   H/O vertigo    History of stomach ulcers    HOH (hard of hearing)    Hypertension    Insomnia    Wears dentures     Past Surgical History:  Procedure Laterality Date   APPENDECTOMY  1999   Dr.Byrnett   BELPHAROPTOSIS REPAIR  2010   CATARACT EXTRACTION W/PHACO Left 09/13/2019   Procedure: CATARACT EXTRACTION PHACO AND INTRAOCULAR LENS PLACEMENT (IOC) LEFT  5.06  00:48.8;  Surgeon: Galen Manila, MD;  Location: [facility];  Service: Ophthalmology;  Laterality: Left;   CATARACT EXTRACTION W/PHACO Right 10/04/2019   Procedure: CATARACT EXTRACTION PHACO AND INTRAOCULAR LENS PLACEMENT (IOC) RIGHT;  Surgeon: Galen Manila, MD;  Location: [facility];  Service: Ophthalmology;  Laterality: Right;  4.64 0:43.9   ECTROPION REPAIR Bilateral 04/24/2015   Procedure: REPAIR OF ECTROPION TARSAL WEDGE LEFT EYE REPAIR EXTENSIVE BILATERAL;  Surgeon: Imagene Riches, MD;  Location:  [facility];  Service: Ophthalmology;  Laterality: Bilateral;   HERNIA REPAIR  1999   Dr.Byrnett   HIP ARTHROPLASTY Right 11/29/2020   Procedure: ARTHROPLASTY BIPOLAR HIP (HEMIARTHROPLASTY)-Extra Scrub;  Surgeon: Signa Kell, MD;  Location: [facility];  Service: Orthopedics;  Laterality: Right;   IR CATHETER TUBE CHANGE  03/12/2021   LACRIMAL DUCT EXPLORATION Bilateral 04/24/2015   Procedure: LACRIMAL DUCT EXPLORATION PUNCTOPLASTY;  Surgeon: Imagene Riches, MD;  Location: [facility];  Service: Ophthalmology;  Laterality: Bilateral;   SEPTOPLASTY     STOMACH SURGERY  1966   ulcer    MEDICATIONS:  Prior to Admission medications   Medication Sig Start Date End Date Taking? Authorizing Provider  acetaminophen (TYLENOL) 325 MG tablet Take 2 tablets (650 mg total) by mouth every 6 (six) hours as needed for mild pain, moderate pain or headache. 11/25/22   Darlin Priestly, MD  ALPRAZolam (XANAX XR) 0.5 MG 24 hr tablet Take 1 tablet (0.5 mg total) by mouth at bedtime as needed for anxiety. Home med. 11/25/22   Darlin Priestly, MD  feeding supplement (ENSURE ENLIVE / ENSURE PLUS) LIQD Take 237 mLs by mouth 3 (three) times daily between meals. 11/25/22   Darlin Priestly, MD  leptospermum manuka honey (MEDIHONEY) PSTE paste Apply 1 Application topically daily. 11/25/22   Darlin Priestly, MD  meclizine (ANTIVERT) 25 MG tablet  Take 1 tablet (25 mg total) by mouth 3 (three) times daily as needed for dizziness. 11/25/22   Darlin Priestly, MD  Multiple Vitamins-Minerals (MULTIVITAMIN WITH MINERALS) tablet Take 1 tablet by mouth daily. Patient not taking: Reported on 11/20/2022    [provider]  QUEtiapine (SEROQUEL) 50 MG tablet Take 1 tablet (50 mg total) by mouth at bedtime for 5 days. 11/25/22 11/30/22  Darlin Priestly, MD  Water For Irrigation, Sterile (FREE WATER) SOLN Take 200 mLs by mouth 3 (three) times daily. 11/25/22   Darlin Priestly, MD    Physical Exam   Triage Vital Signs: ED Triage Vitals  Encounter Vitals Group      BP 02/25/23 0505 (!) 162/83     Systolic BP Percentile --      Diastolic BP Percentile --      Pulse Rate 02/25/23 0505 (!) 113     Resp 02/25/23 0505 18     Temp 02/25/23 0505 97.8 F (36.6 C)     Temp Source 02/25/23 0505 Oral     SpO2 02/25/23 0505 100 %     Weight 02/25/23 0506 136 lb 11 oz (62 kg)     Height 02/25/23 0506 6\' 1"  (1.854 m)     Head Circumference --      Peak Flow --      Pain Score 02/25/23 0506 7     Pain Loc --      Pain Education --      Exclude from Growth Chart --     Most recent vital signs: Vitals:   02/25/23 0505  BP: (!) 162/83  Pulse: (!) 113  Resp: 18  Temp: 97.8 F (36.6 C)  SpO2: 100%     CONSTITUTIONAL: Alert, responds appropriately to questions.  Very hard of hearing.  Elderly.  Thin. HEAD: Normocephalic; abrasion to the right scalp EYES: Conjunctivae clear, PERRL, EOMI ENT: normal nose; no rhinorrhea; moist mucous membranes; pharynx without lesions noted; no dental injury; no septal hematoma, no epistaxis; no facial deformity or bony tenderness NECK: Supple, no midline spinal tenderness, step-off or deformity; trachea midline CARD: RRR; S1 and S2 appreciated; no murmurs, no clicks, no rubs, no gallops RESP: Normal chest excursion without splinting or tachypnea; breath sounds clear and equal bilaterally; no wheezes, no rhonchi, no rales; no hypoxia or respiratory distress CHEST:  chest wall stable, no crepitus or ecchymosis or deformity, tender to palpation over the left posterior ribs with associated skin tear ABD/GI: Non-distended; soft, non-tender, no rebound, no guarding; no ecchymosis or other lesions noted PELVIS:  stable, nontender to palpation BACK:  The back appears normal; no midline spinal tenderness, step-off or deformity; kyphotic spine EXT: Tender to palpation over the left wrist and elbow with associated skin tears.  He has what appears to be healing skin tear to his right distal shin which he states is old.  Compartments  are soft. SKIN: Normal color for age and race; warm NEURO: No facial asymmetry, normal speech, moving all extremities equally  ED Results / Procedures / Treatments   LABS: (all labs ordered are listed, but only abnormal results are displayed) Labs Reviewed  CBC WITH DIFFERENTIAL/PLATELET  COMPREHENSIVE METABOLIC PANEL  CK  URINALYSIS, ROUTINE W REFLEX MICROSCOPIC  PROTIME-INR  TROPONIN I (HIGH SENSITIVITY)     EKG:   Date: 02/25/2023 6:52 AM  Rate: 63  Rhythm: NSR  QRS Axis: normal  Intervals: normal  ST/T Wave abnormalities: normal  Conduction Disutrbances: none  Narrative Interpretation: Atrial fibrillation,  bifascicular block, anterior ST elevation similar compared to previous EKGs      RADIOLOGY: My personal review and interpretation of imaging: CTs and x-rays show no acute traumatic injury.  I have personally reviewed all radiology reports. CT CHEST WO CONTRAST  Result Date: 02/25/2023 CLINICAL DATA:  89 year old male status post fall at 1900 hours. Down until 0400 hours. Pain. EXAM: CT CHEST WITHOUT CONTRAST TECHNIQUE: Multidetector CT imaging of the chest was performed following the standard protocol without IV contrast. RADIATION DOSE REDUCTION: This exam was performed according to the departmental dose-optimization program which includes automated exposure control, adjustment of the mA and/or kV according to patient size and/or use of iterative reconstruction technique. COMPARISON:  Cervical spine CT today.  Chest CT 11/20/2022. FINDINGS: Cardiovascular: Vascular patency is not evaluated in the absence of IV contrast. Stable tortuosity of the thoracic aorta. Calcified aortic atherosclerosis. Calcified coronary artery atherosclerosis. Mild-to-moderate cardiomegaly. No pericardial effusion. Mediastinum/Nodes: No mediastinal hematoma, mass, lymphadenopathy identified in the absence of IV contrast. There are small postinflammatory calcified left hilar and subcarinal lymph  nodes redemonstrated. Lungs/Pleura: Major airways are patent. Stable lung volumes since July. Mild dependent atelectasis not significantly changed. No pneumothorax, pleural effusion or pulmonary contusion. No active pulmonary inflammation identified. Upper Abdomen: Chronic upper abdominal surgical clips adjacent to the celiac axis. No upper abdominal free air or free fluid. Visible noncontrast liver, spleen, pancreas, adrenal glands and bowel appears stable. Retained stool at redundant splenic flexure. Partially visible noncontrast kidneys appear stable. Calcified aortic atherosclerosis. Musculoskeletal: Generalized osteopenia. Subtotal thoracic spinal ankylosis, with intermittent costovertebral ankylosis also. Visible shoulder osseous structures appear intact. No acute right rib fracture identified. No acute left rib fracture identified. Sternum appears stable and intact. Ankylosed T1 and T2 segments appear stable and intact. T3 through T5 partial posterior element ankylosis. Those levels appears stable. Mild T6 superior endplate compression is stable. Chronic T6 through T11 ankylosis, those levels appears stable. Mild chronic T11 inferior endplate compression. Unfused T12 vertebra appears stable with mild chronic compression. L1-L2 interbody ankylosis, those levels appears stable. IMPRESSION: 1. No acute traumatic injury identified in the noncontrast Chest. 2. 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 3.  Aortic Atherosclerosis (ICD10-I70.0), tortuosity. Electronically Signed   By: Odessa Fleming M.D.   On: 02/25/2023 06:57   DG Wrist Complete Left  Result Date: 02/25/2023 CLINICAL DATA:  89 year old male status post fall at 1900 hours. Down until 0400 hours. Pain. EXAM: LEFT WRIST - COMPLETE 3+ VIEW COMPARISON:  Left wrist series 02/22/2019. FINDINGS: Distal radius and ulnar styloid fractures in 2020 with interval healing. Residual distal radius impaction  deformity. Maintained carpal bone alignment and joint spaces. Moderate to severe chronic 1st CMC joint osteoarthritis. No new fracture or dislocation is identified. IMPRESSION: No acute osseous abnormality identified. Chronic distal left radius and ulna fractures. Osteoarthritis. Electronically Signed   By: Odessa Fleming M.D.   On: 02/25/2023 06:41   DG Elbow Complete Left  Result Date: 02/25/2023 CLINICAL DATA:  89 year old male status post fall at 1900 hours. Down until 0400 hours. Pain. EXAM: LEFT ELBOW - COMPLETE 3+ VIEW COMPARISON:  Left elbow series 12/30/2019. FINDINGS: Stable bone mineralization. Stable joint spaces and alignment. No evidence of joint effusion. No acute fracture or dislocation identified. Soft tissue injury and irregularity along the ulnar aspect of the elbow and distal humerus. IMPRESSION: Soft tissue injury. No acute fracture or dislocation identified about the left elbow. Electronically Signed   By: Althea Grimmer.D.  On: 02/25/2023 06:39   CT Cervical Spine Wo Contrast  Result Date: 02/25/2023 CLINICAL DATA:  89 year old male status post fall at 1900 hours. Down until 0400 hours. Pain. EXAM: CT CERVICAL SPINE WITHOUT CONTRAST TECHNIQUE: Multidetector CT imaging of the cervical spine was performed without intravenous contrast. Multiplanar CT image reconstructions were also generated. RADIATION DOSE REDUCTION: This exam was performed according to the departmental dose-optimization program which includes automated exposure control, adjustment of the mA and/or kV according to patient size and/or use of iterative reconstruction technique. COMPARISON:  Head CT today.  Cervical spine CT 11/20/2022. FINDINGS: Alignment: Stable.  Stable posterior element alignment. Skull base and vertebrae: Visualized skull base is intact. No atlanto-occipital dissociation. C1 and C2 appear chronically degenerated but intact and aligned. Chronic cervical ankylosis from C3 into the upper thoracic spine.  Chronically unfused C1-C2 and C2-C3 levels appears stable. No acute osseous abnormality identified. Soft tissues and spinal canal: No prevertebral fluid or swelling. No visible canal hematoma. Stable visible noncontrast neck soft tissues, mild for age carotid calcified atherosclerosis. Disc levels: Widespread cervical spine ankylosis. Chronic ligamentous hypertrophy at C1-C2. Chronic C1-C2 joint space loss which is moderate to severe on the right. Upper chest: Chest CT today is reported separately. IMPRESSION: 1. No acute fracture identified in the chronically ankylosed cervical spine. 2. Chest CT today reported separately. Electronically Signed   By: Odessa Fleming M.D.   On: 02/25/2023 06:38   CT HEAD WO CONTRAST ( )  Result Date: 02/25/2023 CLINICAL DATA:  89 year old male status post fall at 1900 hours. Down until 0400 hours. EXAM: CT HEAD WITHOUT CONTRAST TECHNIQUE: Contiguous axial images were obtained from the base of the skull through the vertex without intravenous contrast. RADIATION DOSE REDUCTION: This exam was performed according to the departmental dose-optimization program which includes automated exposure control, adjustment of the mA and/or kV according to patient size and/or use of iterative reconstruction technique. COMPARISON:  Head CT 11/20/2022. FINDINGS: Brain: No significant change in low-density left superior convexity subdural hematoma since July, 3-4 mm in thickness (coronal image 41). No midline shift or intracranial mass effect. No ventriculomegaly. No new intracranial hemorrhage identified. Stable gray-white matter differentiation throughout the brain. Mild to moderate for age patchy white matter hypodensity. Normal basilar cisterns. Vascular: Calcified atherosclerosis at the skull base. No suspicious intracranial vascular hyperdensity. Skull: No acute fracture identified. Sinuses/Orbits: Visualized paranasal sinuses and mastoids are stable and well aerated. Other: No acute orbit or  scalp soft tissue injury identified. IMPRESSION: 1. Stable 3-4 mm low-density left superior convexity Subdural Hematoma since July. 2. No new intracranial abnormality or acute traumatic injury identified. Electronically Signed   By: Odessa Fleming M.D.   On: 02/25/2023 06:34     PROCEDURES:  Critical Care performed: No    .1-3 Lead EKG Interpretation  Performed by: Kharter Sestak, Layla Maw, DO Authorized by: Kaydan Wilhoite, Layla Maw, DO     Interpretation: abnormal     ECG rate:  113   ECG rate assessment: tachycardic     Rhythm: sinus tachycardia     Ectopy: none     Conduction: normal       IMPRESSION / MDM / ASSESSMENT AND PLAN / ED COURSE  I reviewed the triage vital signs and the nursing notes.  Patient here after unwitnessed fall after he states his right leg gave out on him.  The patient is on the cardiac monitor to evaluate for evidence of arrhythmia and/or significant heart rate changes.   DIFFERENTIAL DIAGNOSIS (includes but not  limited to):   Skin tears, fracture, skull fracture, intracranial hemorrhage, cervical spine fracture, rib fractures, pneumothorax, rhabdomyolysis, anemia, electrolyte derangement, UTI  Patient's presentation is most consistent with acute presentation with potential threat to life or bodily function.  PLAN: Will obtain CT imaging of the head, cervical spine and chest.  Will obtain x-rays of the left upper extremity.  Will give Tylenol for pain.  Will clean his wounds, apply bacitracin and update his tetanus vaccine.  There are no lacerations that need repair.  Will check labs, urine today.   MEDICATIONS GIVEN IN ED: Medications  Tdap (BOOSTRIX) injection 0.5 mL (has no administration in time range)  acetaminophen (TYLENOL) tablet 1,000 mg (1,000 mg Oral Given 02/25/23 0709)  bacitracin ointment (1 Application Topical Given 02/25/23 0708)     ED COURSE: CTs, x-rays reviewed and interpreted by myself and the radiologist and show no acute traumatic injury.  Labs,  urine pending.  Signed out the oncoming ED physician to follow-up on these test results for further disposition.   CONSULTS: Dispo pending further workup.   OUTSIDE RECORDS REVIEWED: Reviewed last admission in July 2024 for traumatic rhabdomyolysis       FINAL CLINICAL IMPRESSION(S) / ED DIAGNOSES   Final diagnoses:  Fall, initial encounter  Injury of head, initial encounter  Multiple skin tears     Rx / DC Orders   ED Discharge Orders     None        Note:  This document was prepared using Dragon voice recognition software and may include unintentional dictation errors.   Alina Gilkey, Layla Maw, DO 02/25/23 [phone]

## 2023-02-25 NOTE — Assessment & Plan Note (Signed)
Cr 1.6 w/ GFR in the 30s  At baseline  Monitor

## 2023-02-25 NOTE — Assessment & Plan Note (Addendum)
Stable 3-4 mm low-density left superior convexity Subdural Hematoma since July on CT head  Monitor

## 2023-02-25 NOTE — Progress Notes (Signed)
PHARMACIST - PHYSICIAN COMMUNICATION  CONCERNING:  Enoxaparin (Lovenox) for DVT Prophylaxis    RECOMMENDATION: Patient was prescribed enoxaprin 40mg  q24 hours for VTE prophylaxis.   Filed Weights   02/25/23 0506  Weight: 62 kg (136 lb 11 oz)    Body mass index is 18.03 kg/m.  Estimated Creatinine Clearance: 24.7 mL/min (A) (by C-G formula based on SCr of 1.64 mg/dL (H)).   Patient is candidate for enoxaparin 30mg  every 24 hours based on CrCl <31ml/min  DESCRIPTION: Pharmacy has adjusted enoxaparin dose per [facility] policy.  Patient is now receiving enoxaparin 30 mg every 24 hours   Gardner Candle, PharmD, BCPS Clinical Pharmacist 02/25/2023 9:19 AM

## 2023-02-25 NOTE — Assessment & Plan Note (Signed)
+   mechanical fall at IL[facility] w/ noted scalp and RLE abrasion  CT with noted stable prior SDH  Will place on fall precautions  PT/OT evaluation  Follow

## 2023-02-25 NOTE — Assessment & Plan Note (Signed)
+   generalized confusion w/ severe agitation or lethargy  Looks to be at baseline  Monitor closely  Follow

## 2023-02-25 NOTE — ED Provider Notes (Signed)
8:20 AM Assumed care for off going team.   Blood pressure (!) 162/83, pulse (!) 113, temperature 97.8 F (36.6 C), temperature source Oral, resp. rate 18, height 6\' 1"  (1.854 m), weight 62 kg, SpO2 100%.  See their HPI for full report but in brief pending labs  Left xray elbow negative Wrist negative  INR normal CBC stable white count Hemoglobin slightly lower then 3 months ago  K 5.3 CR 1.64 CK normal  Trop slightly elevated  UA negative   8:34 AM white count elevated, heart rate elevated.  Patient meets sepsis criteria.  I did another evaluation of patient he is got some cellulitis on his right leg but is nearing skin tear.  His toes feel slightly cold but no acute limb ischemia and you can still feel distal pulse.    Given his hyperkalemia I will give him some fluids, Lasix.  Given patient's age I think it be best to have brought him in for observation overnight to monitor his potassium, treat cellulitis and work with physical therapy to make sure he is safe for discharge home.      Concha Se, MD 02/25/23 [phone]

## 2023-02-25 NOTE — ED Notes (Signed)
ED Provider at bedside. 

## 2023-02-25 NOTE — Evaluation (Signed)
Physical Therapy Evaluation Patient Details Name: Jacob Moses MRN: [id number] DOB: [birth year] Today's Date: 02/25/2023  History of Present Illness  Patient is a 89 year old male found down after several hours at [facility]F. Found to have trace cellulitis with acute on chronic wound of RLE. History of  HTN, gerd, recurrent falls, SDH,  stage 3 CKD,  dementia  Clinical Impression  Patient is agreeable to PT evaluation. Supportive daughter at bedside. The patient is from independent living facility and ambulates with a rolling walker at baseline. History of falls.  Today the patient required assistance for mobility. Patient used rolling walker with steadying assistance required for hallway ambulation. Activity tolerance is limited by fatigue. Patient remains a high fall risk. Recommend to continue PT to maximize independence and facilitate return to prior level of function. Anticipate patient will require rehabilitation after this hospital stay < 3 hours/day.       If plan is discharge home, recommend the following: A lot of help with walking and/or transfers;A little help with bathing/dressing/bathroom;Assistance with cooking/housework;Help with stairs or ramp for entrance;Assist for transportation   Can travel by private vehicle   Yes    Equipment Recommendations Rolling walker (2 wheels)  Recommendations for Other Services       Functional Status Assessment Patient has had a recent decline in their functional status and demonstrates the ability to make significant improvements in function in a reasonable and predictable amount of time.     Precautions / Restrictions Precautions Precautions: Fall Restrictions Weight Bearing Restrictions: No      Mobility  Bed Mobility Overal bed mobility: Needs Assistance Bed Mobility: Supine to Sit, Sit to Supine     Supine to sit: Contact guard Sit to supine: Contact guard assist   General bed mobility comments: increased time     Transfers Overall transfer level: Needs assistance Equipment used: Rolling walker (2 wheels) Transfers: Sit to/from Stand Sit to Stand: Min assist, +2 physical assistance           General transfer comment: verbal cues for task initiation and safety cues provided    Ambulation/Gait Ambulation/Gait assistance: Min assist, +2 physical assistance Gait Distance (Feet): 50 Feet Assistive device: Rolling walker (2 wheels) Gait Pattern/deviations: Step-through pattern, Narrow base of support, Trunk flexed Gait velocity: decreased     General Gait Details: patient ambulated into hallway with rolling walker with steadying assistance provided  Stairs            Wheelchair Mobility     Tilt Bed    Modified Rankin (Stroke Patients Only)       Balance Overall balance assessment: Needs assistance Sitting-balance support: Bilateral upper extremity supported, Feet supported Sitting balance-Leahy Scale: Fair     Standing balance support: Bilateral upper extremity supported Standing balance-Leahy Scale: Poor Standing balance comment: external support required to maintain standing balance with heavy reliance on rolling walker for support                             Pertinent Vitals/Pain Pain Assessment Pain Assessment: No/denies pain    Home Living Family/patient expects to be discharged to:: Private residence Living Arrangements: Alone Available Help at Discharge: Family;Available PRN/intermittently Type of Home: Independent living facility Home Access: Level entry       Home Layout: One level Home Equipment: Pharmacist, hospital (2 wheels)      Prior Function Prior Level of Function : Needs assist;History of Falls (last  six months)       Physical Assist : ADLs (physical)   ADLs (physical): IADLs Mobility Comments: RW at baseline. Daughter reports that pt hasn't been out in the community in the last 3-4 weeks. ADLs Comments: Generally  MOD I in ADL, Daughter assist for IADL(groceries, laundry, etc.) as needed.     Extremity/Trunk Assessment   Upper Extremity Assessment Upper Extremity Assessment: Overall WFL for tasks assessed    Lower Extremity Assessment Lower Extremity Assessment: Generalized weakness;RLE deficits/detail RLE Deficits / Details: hip internal rotation at rest and with ambulation. abrasions noted mid tibia area       Communication   Communication Communication: Hearing impairment Cueing Techniques: Verbal cues  Cognition Arousal: Alert Behavior During Therapy: WFL for tasks assessed/performed Overall Cognitive Status: Within Functional Limits for tasks assessed                                          General Comments      Exercises     Assessment/Plan    PT Assessment Patient needs continued PT services  PT Problem List Decreased strength;Decreased range of motion;Decreased activity tolerance;Decreased balance;Decreased mobility;Decreased knowledge of use of DME;Decreased safety awareness       PT Treatment Interventions DME instruction;Gait training;Functional mobility training;Therapeutic activities;Therapeutic exercise;Balance training;Neuromuscular re-education;Cognitive remediation;Patient/family education    PT Goals (Current goals can be found in the Care Plan section)  Acute Rehab PT Goals Patient Stated Goal: to go home PT Goal Formulation: With patient Time For Goal Achievement: 03/11/23 Potential to Achieve Goals: Fair    Frequency Min 1X/week     Co-evaluation PT/OT/SLP Co-Evaluation/Treatment: Yes Reason for Co-Treatment: Complexity of the patient's impairments (multi-system involvement) PT goals addressed during session: Mobility/safety with mobility OT goals addressed during session: ADL's and self-care       AM-PAC PT "6 Clicks" Mobility  Outcome Measure Help needed turning from your back to your side while in a flat bed without using  bedrails?: A Little Help needed moving from lying on your back to sitting on the side of a flat bed without using bedrails?: A Lot Help needed moving to and from a bed to a chair (including a wheelchair)?: A Lot Help needed standing up from a chair using your arms (e.g., wheelchair or bedside chair)?: A Lot Help needed to walk in hospital room?: A Lot Help needed climbing 3-5 steps with a railing? : Total 6 Click Score: 12    End of Session   Activity Tolerance: Patient tolerated treatment well Patient left: in bed;with call bell/phone within reach;with family/visitor present Nurse Communication: Mobility status PT Visit Diagnosis: Unsteadiness on feet (R26.81);Muscle weakness (generalized) (M62.81)    Time: 1610-9604 PT Time Calculation (min) (ACUTE ONLY): 24 min   Charges:   PT Evaluation $PT Eval Low Complexity: 1 Low   PT General Charges $$ ACUTE PT VISIT: 1 Visit         Donna Bernard, PT, MPT   Ina Homes 02/25/2023, 3:30 PM

## 2023-02-25 NOTE — Consult Note (Addendum)
WOC Nurse Consult Note: Reason for Consult: Remote consult completed via review of chart. Old traumatic injury to right lower leg from previous fall.  New fall with abrasions noted to arms and scalp,. Wound type: trauma Pressure Injury POA: NA Measurement: see nurse flow sheet for admission assessment Wound bed: Ruddy red, moist Drainage (amount, consistency, odor) minimal serosanguinous  no odor noted Periwound: Erythema and tenderness.   Treating for cellulitis Dressing procedure/placement/frequency: Cleanse wound to right lower leg, arms and scalp with VASHE cleanser and pat dry. Apply VASHE moist gauze to woundbed.  Cover with foam dressing. Change daily.  Leave scalp abrasion open to air if unable to secure dressing.  Will not follow at this time.  Please re-consult if needed.  Mike Gip MSN, RN, FNP-BC CWON Wound, Ostomy, Continence Nurse Outpatient [facility] [phone] Pager [phone]

## 2023-02-25 NOTE — Progress Notes (Signed)
CODE SEPSIS - PHARMACY COMMUNICATION  **Broad Spectrum Antibiotics should be administered within 1 hour of Sepsis diagnosis**  Time Code Sepsis Called/Page Received: 10/23 @ 1610  Antibiotics Ordered: Ceftriaxone  Time of 1st antibiotic administration: 0912  Additional action taken by pharmacy: Messaged RN  If necessary, Name of Provider/Nurse Contacted: Julio Alm ,PharmD Clinical Pharmacist  02/25/2023  8:41 AM

## 2023-02-25 NOTE — Evaluation (Signed)
Occupational Therapy Evaluation Patient Details Name: Jacob Moses MRN: [id number] DOB: 1927-06-10 Today's Date: 02/25/2023   History of Present Illness Jacob Moses is a 89 y.o. male with medical history significant of HTN, gerd, recurrent falls, SDH,  stage 3 CKD,  dementia presenting with fall and cellulitis.  Patient noted to be living at an independent living facility.  Per report, patient with unwitnessed fall.  Patient was down for several hours per report.  Patient unaware of what happened.  Denies any severe headache, chest pain or shortness of breath.  No reported fevers or chills.  No reported nausea or vomiting.  Had similar issue that required admission July 2024.   Clinical Impression   Pt was seen for OT evaluation this date. Prior to hospital admission, pt was Mod I with ADL's. Pt lives with daughter and she helps pt with IADL's such as groceries and laundry. Pt presents to acute OT demonstrating impaired ADL performance and functional mobility 2/2(See OT problem list for additional functional deficits). Upon arrival to room, RN/NT present and pt supine in bed, agreeable to PT/OT co-tx.   Pt's daughter present during the session. Pt is severely HOH. Pt completed supine<>sit t/f with CGA. Pt required Min A x2 for sit<>stand t/f +RW. Pt ambulated 13ft in the hallway and back to room with CGA +RW. Pt left supine in bed with call bell within reach and all need met. Pt would benefit from skilled OT services to address noted impairments and functional limitations (see below for any additional details) in order to maximize safety and independence while minimizing falls risk and caregiver burden. Anticipate the need for follow up OT services upon acute hospital DC.       If plan is discharge home, recommend the following: A little help with walking and/or transfers;A little help with bathing/dressing/bathroom;Assistance with cooking/housework;Assist for transportation    Functional  Status Assessment  Patient has had a recent decline in their functional status and demonstrates the ability to make significant improvements in function in a reasonable and predictable amount of time.  Equipment Recommendations  Other (comment) (Defer to next venue of care.)    Recommendations for Other Services       Precautions / Restrictions Precautions Precautions: None Restrictions Weight Bearing Restrictions: No      Mobility Bed Mobility Overal bed mobility: Needs Assistance Bed Mobility: Supine to Sit, Sit to Supine     Supine to sit: Contact guard Sit to supine: Contact guard assist        Transfers Overall transfer level: Needs assistance Equipment used: Rolling walker (2 wheels) Transfers: Sit to/from Stand Sit to Stand: Min assist, +2 physical assistance                  Balance Overall balance assessment: Needs assistance Sitting-balance support: Bilateral upper extremity supported, Feet supported Sitting balance-Leahy Scale: Fair       Standing balance-Leahy Scale: Fair                             ADL either performed or assessed with clinical judgement   ADL Overall ADL's : Needs assistance/impaired                                     Functional mobility during ADLs: Minimal assistance;+2 for safety/equipment;Rolling walker (2 wheels)  Vision Patient Visual Report: No change from baseline       Perception         Praxis         Pertinent Vitals/Pain Pain Assessment Pain Assessment: No/denies pain     Extremity/Trunk Assessment Upper Extremity Assessment Upper Extremity Assessment: Overall WFL for tasks assessed   Lower Extremity Assessment Lower Extremity Assessment: Generalized weakness       Communication Communication Communication: Hearing impairment (HOH) Cueing Techniques: Verbal cues   Cognition Arousal: Alert Behavior During Therapy: WFL for tasks  assessed/performed Overall Cognitive Status: Within Functional Limits for tasks assessed                                       General Comments       Exercises     Shoulder Instructions      Home Living Family/patient expects to be discharged to:: Private residence Living Arrangements: Alone Available Help at Discharge: Family;Available PRN/intermittently Type of Home: Independent living facility Home Access: Level entry     Home Layout: One level     Bathroom Shower/Tub: Producer, television/film/video: Handicapped height Bathroom Accessibility: Yes   Home Equipment: Pharmacist, hospital (2 wheels)          Prior Functioning/Environment Prior Level of Function : Needs assist       Physical Assist : ADLs (physical);Mobility (physical)   ADLs (physical): IADLs Mobility Comments: RW at baseline. Daughter reports that pt hasn't been out in the community in the last 3-4 weeks. ADLs Comments: Generally MOD I in ADL, Daughter assist for IADL(groceries, laundry, etc.) as needed.        OT Problem List: Decreased strength;Decreased range of motion;Decreased activity tolerance;Impaired balance (sitting and/or standing);Decreased safety awareness;Decreased coordination;Decreased knowledge of use of DME or AE      OT Treatment/Interventions: Self-care/ADL training;Therapeutic exercise;Therapeutic activities;Energy conservation;DME and/or AE instruction;Patient/family education    OT Goals(Current goals can be found in the care plan section) Acute Rehab OT Goals Patient Stated Goal: to go home. OT Goal Formulation: With patient/family Time For Goal Achievement: 03/11/23 Potential to Achieve Goals: Fair  OT Frequency: Min 1X/week    Co-evaluation PT/OT/SLP Co-Evaluation/Treatment: Yes     OT goals addressed during session: ADL's and self-care      AM-PAC OT "6 Clicks" Daily Activity     Outcome Measure Help from another person eating  meals?: None Help from another person taking care of personal grooming?: A Little Help from another person toileting, which includes using toliet, bedpan, or urinal?: A Little Help from another person bathing (including washing, rinsing, drying)?: A Little Help from another person to put on and taking off regular upper body clothing?: A Little Help from another person to put on and taking off regular lower body clothing?: A Little 6 Click Score: 19   End of Session Equipment Utilized During Treatment: Rolling walker (2 wheels)  Activity Tolerance: Patient tolerated treatment well Patient left: in bed;with call bell/phone within reach;with bed alarm set;with family/visitor present  OT Visit Diagnosis: Unsteadiness on feet (R26.81);Other abnormalities of gait and mobility (R26.89);Muscle weakness (generalized) (M62.81);History of falling (Z91.81)                Time: 0200-0232 OT Time Calculation (min): 32 min Charges:     [name], SOT

## 2023-02-25 NOTE — H&P (Signed)
History and Physical    Patient: Jacob Moses ZOX:[id number] DOB: 1927-07-12 DOA: 02/25/2023 DOS: the patient was seen and examined on 02/25/2023 PCP: Jaclyn Shaggy, MD  Patient coming from: Home  Chief Complaint:  Chief Complaint  Patient presents with   Fall   HPI: MALO PRAYER is a 89 y.o. male with medical history significant of HTN, gerd, recurrent falls, SDH,  stage 3 CKD,  dementia presenting with fall and cellulitis.  Patient noted to be living at an independent living facility.  Per report, patient with unwitnessed fall.  Patient was down for several hours per report.  Patient unaware of what happened.  Denies any severe headache, chest pain or shortness of breath.  No reported fevers or chills.  No reported nausea or vomiting.  Had similar issue that required admission July 2024. Presented to the ER afebrile, hemodynamically stable.  Satting well on room air.  White count 12.4, hemoglobin 10.7, platelets 240, lactate 1.1, creatinine 1.64, potassium 5.3.  CT head with stable 3 to 4 mm subdural hematoma.  CT chest, CT C-spine as well as elbow and wrist imaging stable. Review of Systems: As mentioned in the history of present illness. All other systems reviewed and are negative. Past Medical History:  Diagnosis Date   H/O vertigo    History of stomach ulcers    HOH (hard of hearing)    Hypertension    Insomnia    Wears dentures    Past Surgical History:  Procedure Laterality Date   APPENDECTOMY  1999   Dr.Byrnett   BELPHAROPTOSIS REPAIR  2010   CATARACT EXTRACTION W/PHACO Left 09/13/2019   Procedure: CATARACT EXTRACTION PHACO AND INTRAOCULAR LENS PLACEMENT (IOC) LEFT  5.06  00:48.8;  Surgeon: Galen Manila, MD;  Location: [facility];  Service: Ophthalmology;  Laterality: Left;   CATARACT EXTRACTION W/PHACO Right 10/04/2019   Procedure: CATARACT EXTRACTION PHACO AND INTRAOCULAR LENS PLACEMENT (IOC) RIGHT;  Surgeon: Galen Manila, MD;  Location: [facility];  Service: Ophthalmology;  Laterality: Right;  4.64 0:43.9   ECTROPION REPAIR Bilateral 04/24/2015   Procedure: REPAIR OF ECTROPION TARSAL WEDGE LEFT EYE REPAIR EXTENSIVE BILATERAL;  Surgeon: Imagene Riches, MD;  Location: [facility];  Service: Ophthalmology;  Laterality: Bilateral;   HERNIA REPAIR  1999   Dr.Byrnett   HIP ARTHROPLASTY Right 11/29/2020   Procedure: ARTHROPLASTY BIPOLAR HIP (HEMIARTHROPLASTY)-Extra Scrub;  Surgeon: Signa Kell, MD;  Location: [facility] OR[facility];  Service: Orthopedics;  Laterality: Right;   IR CATHETER TUBE CHANGE  03/12/2021   LACRIMAL DUCT EXPLORATION Bilateral 04/24/2015   Procedure: LACRIMAL DUCT EXPLORATION PUNCTOPLASTY;  Surgeon: Imagene Riches, MD;  Location: [facility];  Service: Ophthalmology;  Laterality: Bilateral;   SEPTOPLASTY     STOMACH SURGERY  1966   ulcer   Social History:  reports that he quit smoking about 44 years ago. His smoking use included cigarettes. He started smoking about 74 years ago. He has never used smokeless tobacco. He reports that he does not drink alcohol and does not use drugs.  No Known Allergies  Family History  Problem Relation Age of Onset   Cancer Mother    Diabetes Father     Prior to Admission medications   Medication Sig Start Date End Date Taking? Authorizing Provider  acetaminophen (TYLENOL) 325 MG tablet Take 2 tablets (650 mg total) by mouth every 6 (six) hours as needed for mild pain, moderate pain or headache. 11/25/22   Darlin Priestly, MD  ALPRAZolam (  XANAX XR) 0.5 MG 24 hr tablet Take 1 tablet (0.5 mg total) by mouth at bedtime as needed for anxiety. Home med. 11/25/22   Darlin Priestly, MD  feeding supplement (ENSURE ENLIVE / ENSURE PLUS) LIQD Take 237 mLs by mouth 3 (three) times daily between meals. 11/25/22   Darlin Priestly, MD  leptospermum manuka honey (MEDIHONEY) PSTE paste Apply 1 Application topically daily. 11/25/22   Darlin Priestly, MD  meclizine (ANTIVERT) 25 MG tablet Take 1 tablet (25 mg total)  by mouth 3 (three) times daily as needed for dizziness. 11/25/22   Darlin Priestly, MD  Multiple Vitamins-Minerals (MULTIVITAMIN WITH MINERALS) tablet Take 1 tablet by mouth daily. Patient not taking: Reported on 11/20/2022    [provider]  QUEtiapine (SEROQUEL) 50 MG tablet Take 1 tablet (50 mg total) by mouth at bedtime for 5 days. 11/25/22 11/30/22  Darlin Priestly, MD  Water For Irrigation, Sterile (FREE WATER) SOLN Take 200 mLs by mouth 3 (three) times daily. 11/25/22   Darlin Priestly, MD    Physical Exam: Vitals:   02/25/23 0505 02/25/23 0506 02/25/23 0905 02/25/23 0913  BP: (!) 162/83  (!) 167/81   Pulse: (!) 113     Resp: 18  19   Temp: 97.8 F (36.6 C)   97.7 F (36.5 C)  TempSrc: Oral   Oral  SpO2: 100%     Weight:  62 kg    Height:  6\' 1"  (1.854 m)     Physical Exam Constitutional:      Appearance: He is normal weight.  HENT:     Head: Normocephalic and atraumatic.     Comments: + R scalp abrasion      Mouth/Throat:     Mouth: Mucous membranes are moist.  Eyes:     Pupils: Pupils are equal, round, and reactive to light.  Cardiovascular:     Rate and Rhythm: Normal rate and regular rhythm.  Pulmonary:     Effort: Pulmonary effort is normal.  Abdominal:     General: Bowel sounds are normal.  Skin:    Comments: + R shin abrasion w/ mild generalized erythema    Neurological:     General: No focal deficit present.     Comments: + mild generalized confusion    Psychiatric:        Mood and Affect: Mood normal.     Data Reviewed:  There are no new results to review at this time.  CT CHEST WO CONTRAST CLINICAL DATA:  89 year old male status post fall at 1900 hours. Down until 0400 hours. Pain.  EXAM: CT CHEST WITHOUT CONTRAST  TECHNIQUE: Multidetector CT imaging of the chest was performed following the standard protocol without IV contrast.  RADIATION DOSE REDUCTION: This exam was performed according to the departmental dose-optimization program which  includes automated exposure control, adjustment of the mA and/or kV according to patient size and/or use of iterative reconstruction technique.  COMPARISON:  Cervical spine CT today.  Chest CT 11/20/2022.  FINDINGS: Cardiovascular: Vascular patency is not evaluated in the absence of IV contrast. Stable tortuosity of the thoracic aorta. Calcified aortic atherosclerosis. Calcified coronary artery atherosclerosis. Mild-to-moderate cardiomegaly. No pericardial effusion.  Mediastinum/Nodes: No mediastinal hematoma, mass, lymphadenopathy identified in the absence of IV contrast. There are small postinflammatory calcified left hilar and subcarinal lymph nodes redemonstrated.  Lungs/Pleura: Major airways are patent. Stable lung volumes since July. Mild dependent atelectasis not significantly changed. No pneumothorax, pleural effusion or pulmonary contusion. No active pulmonary inflammation identified.  Upper Abdomen: Chronic upper abdominal surgical clips adjacent to the celiac axis. No upper abdominal free air or free fluid. Visible noncontrast liver, spleen, pancreas, adrenal glands and bowel appears stable. Retained stool at redundant splenic flexure. Partially visible noncontrast kidneys appear stable. Calcified aortic atherosclerosis.  Musculoskeletal: Generalized osteopenia. Subtotal thoracic spinal ankylosis, with intermittent costovertebral ankylosis also. Visible shoulder osseous structures appear intact. No acute right rib fracture identified. No acute left rib fracture identified. Sternum appears stable and intact. Ankylosed T1 and T2 segments appear stable and intact. T3 through T5 partial posterior element ankylosis. Those levels appears stable. Mild T6 superior endplate compression is stable. Chronic T6 through T11 ankylosis, those levels appears stable. Mild chronic T11 inferior endplate compression. Unfused T12 vertebra appears stable with mild chronic compression.  L1-L2 interbody ankylosis, those levels appears stable.  IMPRESSION: 1. No acute traumatic injury identified in the noncontrast Chest. 2. 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 3.  Aortic Atherosclerosis (ICD10-I70.0), tortuosity.  Electronically Signed   By: Odessa Fleming M.D.   On: 02/25/2023 06:57 DG Wrist Complete Left CLINICAL DATA:  89 year old male status post fall at 1900 hours. Down until 0400 hours. Pain.  EXAM: LEFT WRIST - COMPLETE 3+ VIEW  COMPARISON:  Left wrist series 02/22/2019.  FINDINGS: Distal radius and ulnar styloid fractures in 2020 with interval healing. Residual distal radius impaction deformity. Maintained carpal bone alignment and joint spaces. Moderate to severe chronic 1st CMC joint osteoarthritis.  No new fracture or dislocation is identified.  IMPRESSION: No acute osseous abnormality identified. Chronic distal left radius and ulna fractures. Osteoarthritis.  Electronically Signed   By: Odessa Fleming M.D.   On: 02/25/2023 06:41 DG Elbow Complete Left CLINICAL DATA:  89 year old male status post fall at 1900 hours. Down until 0400 hours. Pain.  EXAM: LEFT ELBOW - COMPLETE 3+ VIEW  COMPARISON:  Left elbow series 12/30/2019.  FINDINGS: Stable bone mineralization. Stable joint spaces and alignment. No evidence of joint effusion. No acute fracture or dislocation identified.  Soft tissue injury and irregularity along the ulnar aspect of the elbow and distal humerus.  IMPRESSION: Soft tissue injury. No acute fracture or dislocation identified about the left elbow.  Electronically Signed   By: Odessa Fleming M.D.   On: 02/25/2023 06:39 CT Cervical Spine Wo Contrast CLINICAL DATA:  89 year old male status post fall at 1900 hours. Down until 0400 hours. Pain.  EXAM: CT CERVICAL SPINE WITHOUT CONTRAST  TECHNIQUE: Multidetector CT imaging of the cervical spine was performed without intravenous  contrast. Multiplanar CT image reconstructions were also generated.  RADIATION DOSE REDUCTION: This exam was performed according to the departmental dose-optimization program which includes automated exposure control, adjustment of the mA and/or kV according to patient size and/or use of iterative reconstruction technique.  COMPARISON:  Head CT today.  Cervical spine CT 11/20/2022.  FINDINGS: Alignment: Stable.  Stable posterior element alignment.  Skull base and vertebrae: Visualized skull base is intact. No atlanto-occipital dissociation. C1 and C2 appear chronically degenerated but intact and aligned.  Chronic cervical ankylosis from C3 into the upper thoracic spine. Chronically unfused C1-C2 and C2-C3 levels appears stable. No acute osseous abnormality identified.  Soft tissues and spinal canal: No prevertebral fluid or swelling. No visible canal hematoma. Stable visible noncontrast neck soft tissues, mild for age carotid calcified atherosclerosis.  Disc levels: Widespread cervical spine ankylosis. Chronic ligamentous hypertrophy at C1-C2. Chronic C1-C2 joint space loss which is moderate to severe on the right.  Upper chest: Chest CT today is reported separately.  IMPRESSION: 1. No acute fracture identified in the chronically ankylosed cervical spine. 2. Chest CT today reported separately.  Electronically Signed   By: Odessa Fleming M.D.   On: 02/25/2023 06:38 CT HEAD WO CONTRAST ( ) CLINICAL DATA:  89 year old male status post fall at 1900 hours. Down until 0400 hours.  EXAM: CT HEAD WITHOUT CONTRAST  TECHNIQUE: Contiguous axial images were obtained from the base of the skull through the vertex without intravenous contrast.  RADIATION DOSE REDUCTION: This exam was performed according to the departmental dose-optimization program which includes automated exposure control, adjustment of the mA and/or kV according to patient size and/or use of iterative  reconstruction technique.  COMPARISON:  Head CT 11/20/2022.  FINDINGS: Brain: No significant change in low-density left superior convexity subdural hematoma since July, 3-4 mm in thickness (coronal image 41).  No midline shift or intracranial mass effect. No ventriculomegaly. No new intracranial hemorrhage identified. Stable gray-white matter differentiation throughout the brain. Mild to moderate for age patchy white matter hypodensity. Normal basilar cisterns.  Vascular: Calcified atherosclerosis at the skull base. No suspicious intracranial vascular hyperdensity.  Skull: No acute fracture identified.  Sinuses/Orbits: Visualized paranasal sinuses and mastoids are stable and well aerated.  Other: No acute orbit or scalp soft tissue injury identified.  IMPRESSION: 1. Stable 3-4 mm low-density left superior convexity Subdural Hematoma since July. 2. No new intracranial abnormality or acute traumatic injury identified.  Electronically Signed   By: Odessa Fleming M.D.   On: 02/25/2023 06:34  Lab Results  Component Value Date   WBC 12.4 (H) 02/25/2023   HGB 10.7 (L) 02/25/2023   HCT 33.2 (L) 02/25/2023   MCV 97.9 02/25/2023   PLT 240 02/25/2023   Last metabolic panel Lab Results  Component Value Date   GLUCOSE 99 02/25/2023   NA 136 02/25/2023   K 5.3 (H) 02/25/2023   CL 105 02/25/2023   CO2 23 02/25/2023   BUN 39 (H) 02/25/2023   CREATININE 1.64 (H) 02/25/2023   GFRNONAA 39 (L) 02/25/2023   CALCIUM 8.7 (L) 02/25/2023   PROT 7.0 02/25/2023   ALBUMIN 4.1 02/25/2023   BILITOT 0.7 02/25/2023   ALKPHOS 70 02/25/2023   AST 26 02/25/2023   ALT 16 02/25/2023   ANIONGAP 8 02/25/2023    Assessment and Plan: * Fall + mechanical fall at ILF w/ noted scalp and RLE abrasion  CT with noted stable prior SDH  Will place on fall precautions  PT/OT evaluation  Follow    Cellulitis Trace RLE cellulitis w/ acute on chronic wound s/p fall  WBC 12  Started on IV rocephin in  the ER- continue for now  De-escalate as appropriate  Wound care consult  Monitor   HTN (hypertension) BP stable  Titrate home regimen    CKD (chronic kidney disease), stage IIIb Cr 1.6 w/ GFR in the 30s  At baseline  Monitor    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) + generalized confusion w/ severe agitation or lethargy  Looks to be at baseline  Monitor closely  Follow    SDH (subdural hematoma) (HCC) Stable 3-4 mm low-density left superior convexity Subdural Hematoma since July on CT head  Monitor        Advance Care Planning:   Code Status: Limited: Do not attempt resuscitation (DNR) -DNR-LIMITED -Do Not Intubate/DNI    Consults: None   Family Communication: Discussed case w/ daughter Carolann Littler ([id number])  Severity of  Illness: The appropriate patient status for this patient is INPATIENT. Inpatient status is judged to be reasonable and necessary in order to provide the required intensity of service to ensure the patient's safety. The patient's presenting symptoms, physical exam findings, and initial radiographic and laboratory data in the context of their chronic comorbidities is felt to place them at high risk for further clinical deterioration. Furthermore, it is not anticipated that the patient will be medically stable for discharge from the hospital within 2 midnights of admission.   * I certify that at the point of admission it is my clinical judgment that the patient will require inpatient hospital care spanning beyond 2 midnights from the point of admission due to high intensity of service, high risk for further deterioration and high frequency of surveillance required.*  Author: Floydene Flock, MD 02/25/2023 9:23 AM  For on call review [email or web address].

## 2023-02-25 NOTE — ED Notes (Signed)
Non-adherent dressing applied to skin tear on left elbow.

## 2023-02-25 NOTE — Assessment & Plan Note (Signed)
Trace RLE cellulitis w/ acute on chronic wound s/p fall  WBC 12  Started on IV rocephin in the ER- continue for now  De-escalate as appropriate  Wound care consult  Monitor

## 2023-02-25 NOTE — ED Triage Notes (Signed)
Patient fell around 1900 last night,  did not hit his assistance button until 0400,  patient complaining of left elbow pain,  right lower leg pain, abrasion noted to left temporal scalp

## 2023-02-25 NOTE — Assessment & Plan Note (Signed)
BP stable Titrate home regimen 

## 2023-02-26 DIAGNOSIS — L03115 Cellulitis of right lower limb: Secondary | ICD-10-CM | POA: Diagnosis not present

## 2023-02-26 DIAGNOSIS — W19XXXA Unspecified fall, initial encounter: Secondary | ICD-10-CM | POA: Diagnosis not present

## 2023-02-26 LAB — CBC
HCT: 34.1 % — ABNORMAL LOW (ref 39.0–52.0)
Hemoglobin: 11.1 g/dL — ABNORMAL LOW (ref 13.0–17.0)
MCH: 31.6 pg (ref 26.0–34.0)
MCHC: 32.6 g/dL (ref 30.0–36.0)
MCV: 97.2 fL (ref 80.0–100.0)
Platelets: 247 10*3/uL (ref 150–400)
RBC: 3.51 MIL/uL — ABNORMAL LOW (ref 4.22–5.81)
RDW: 13 % (ref 11.5–15.5)
WBC: 10.1 10*3/uL (ref 4.0–10.5)
nRBC: 0 % (ref 0.0–0.2)

## 2023-02-26 LAB — COMPREHENSIVE METABOLIC PANEL
ALT: 13 U/L (ref 0–44)
AST: 25 U/L (ref 15–41)
Albumin: 3.8 g/dL (ref 3.5–5.0)
Alkaline Phosphatase: 67 U/L (ref 38–126)
Anion gap: 9 (ref 5–15)
BUN: 32 mg/dL — ABNORMAL HIGH (ref 8–23)
CO2: 24 mmol/L (ref 22–32)
Calcium: 8.7 mg/dL — ABNORMAL LOW (ref 8.9–10.3)
Chloride: 103 mmol/L (ref 98–111)
Creatinine, Ser: 1.45 mg/dL — ABNORMAL HIGH (ref 0.61–1.24)
GFR, Estimated: 45 mL/min — ABNORMAL LOW (ref >60)
Glucose, Bld: 115 mg/dL — ABNORMAL HIGH (ref 70–99)
Potassium: 4.8 mmol/L (ref 3.5–5.1)
Sodium: 136 mmol/L (ref 135–145)
Total Bilirubin: 0.6 mg/dL (ref 0.3–1.2)
Total Protein: 7 g/dL (ref 6.5–8.1)

## 2023-02-26 LAB — VITAMIN B12: Vitamin B-12: 246 pg/mL (ref 180–914)

## 2023-02-26 LAB — IRON AND TIBC
Iron: 38 ug/dL — ABNORMAL LOW (ref 45–182)
Saturation Ratios: 12 % — ABNORMAL LOW (ref 17.9–39.5)
TIBC: 328 ug/dL (ref 250–450)
UIBC: 290 ug/dL

## 2023-02-26 LAB — MAGNESIUM: Magnesium: 2.2 mg/dL (ref 1.7–2.4)

## 2023-02-26 LAB — PHOSPHORUS: Phosphorus: 3.3 mg/dL (ref 2.5–4.6)

## 2023-02-26 LAB — FOLATE: Folate: 17.4 ng/mL (ref >5.9)

## 2023-02-26 LAB — VITAMIN D 25 HYDROXY (VIT D DEFICIENCY, FRACTURES): Vit D, 25-Hydroxy: 29 ng/mL — ABNORMAL LOW (ref 30–100)

## 2023-02-26 LAB — TSH: TSH: 3.707 u[IU]/mL (ref 0.350–4.500)

## 2023-02-26 MED ORDER — HYDRALAZINE HCL 50 MG PO TABS: 50.0000 mg | ORAL_TABLET | Freq: Four times a day (QID) | ORAL | Status: DC | PRN

## 2023-02-26 MED ORDER — HALOPERIDOL LACTATE 5 MG/ML IJ SOLN
2.0000 mg | Freq: Four times a day (QID) | INTRAMUSCULAR | Status: DC | PRN
  Administered 2023-02-26 – 2023-03-01 (×4): 2 mg via INTRAVENOUS
  Filled 2023-02-26 (×4): qty 1

## 2023-02-26 MED ORDER — AMLODIPINE BESYLATE 5 MG PO TABS
5.0000 mg | ORAL_TABLET | Freq: Every day | ORAL | Status: DC
  Administered 2023-02-26 – 2023-03-03 (×6): 5 mg via ORAL
  Filled 2023-02-26 (×6): qty 1

## 2023-02-26 MED ORDER — POLYSACCHARIDE IRON COMPLEX 150 MG PO CAPS
150.0000 mg | ORAL_CAPSULE | Freq: Every day | ORAL | Status: DC
  Administered 2023-02-26 – 2023-03-03 (×6): 150 mg via ORAL
  Filled 2023-02-26 (×6): qty 1

## 2023-02-26 MED ORDER — VITAMIN B-12 1000 MCG PO TABS
1000.0000 ug | ORAL_TABLET | Freq: Every day | ORAL | Status: DC
  Administered 2023-02-26 – 2023-03-03 (×6): 1000 ug via ORAL
  Filled 2023-02-26 (×6): qty 1

## 2023-02-26 MED ORDER — LEVOTHYROXINE SODIUM 50 MCG PO TABS
50.0000 ug | ORAL_TABLET | Freq: Every day | ORAL | Status: DC
  Administered 2023-02-27 – 2023-03-03 (×3): 50 ug via ORAL
  Filled 2023-02-26 (×4): qty 1

## 2023-02-26 MED ORDER — VITAMIN C 500 MG PO TABS
500.0000 mg | ORAL_TABLET | Freq: Every day | ORAL | Status: DC
  Administered 2023-02-26 – 2023-03-03 (×6): 500 mg via ORAL
  Filled 2023-02-26 (×6): qty 1

## 2023-02-26 MED ORDER — QUETIAPINE FUMARATE 25 MG PO TABS
25.0000 mg | ORAL_TABLET | Freq: Every evening | ORAL | Status: DC
Start: 2023-02-26 — End: 2023-02-27
  Administered 2023-02-26: 25 mg via ORAL
  Filled 2023-02-26: qty 1

## 2023-02-26 NOTE — Plan of Care (Signed)

## 2023-02-26 NOTE — Plan of Care (Addendum)
Patient alert to  self and very confused. Impulsive, agitated  and attempted to get out the bed. Unable to follow command. MD notified,Received order for  PRN IV haldol. We will continue to monitored.    Problem: Education: Goal: Knowledge of General Education information will improve Description: Including pain rating scale, medication(s)/side effects and non-pharmacologic comfort measures Outcome: Progressing   Problem: Health Behavior/Discharge Planning: Goal: Ability to manage health-related needs will improve Outcome: Progressing   Problem: Clinical Measurements: Goal: Ability to maintain clinical measurements within normal limits will improve Outcome: Progressing Goal: Will remain free from infection Outcome: Progressing Goal: Diagnostic test results will improve Outcome: Progressing Goal: Respiratory complications will improve Outcome: Progressing Goal: Cardiovascular complication will be avoided Outcome: Progressing   Problem: Activity: Goal: Risk for activity intolerance will decrease Outcome: Progressing   Problem: Nutrition: Goal: Adequate nutrition will be maintained Outcome: Progressing   Problem: Coping: Goal: Level of anxiety will decrease Outcome: Progressing   Problem: Elimination: Goal: Will not experience complications related to bowel motility Outcome: Progressing Goal: Will not experience complications related to urinary retention Outcome: Progressing   Problem: Pain Management: Goal: General experience of comfort will improve Outcome: Progressing   Problem: Safety: Goal: Ability to remain free from injury will improve Outcome: Progressing   Problem: Skin Integrity: Goal: Risk for impaired skin integrity will decrease Outcome: Progressing

## 2023-02-26 NOTE — Progress Notes (Signed)
[facility] Progress Note  Patient: Jacob Moses    JXB:[id number]  DOA: 02/25/2023     Date of Service: the patient was seen and examined on 02/26/2023  Chief Complaint  Patient presents with   Fall   Brief hospital course: Jacob Moses is a 89 y.o. male with medical history significant of HTN, gerd, recurrent falls, SDH,  stage 3 CKD,  dementia presenting with fall and cellulitis.  Patient noted to be living at an independent living facility.  Per report, patient with unwitnessed fall.  Patient was down for several hours per report.  Patient unaware of what happened.  Denies any severe headache, chest pain or shortness of breath.  No reported fevers or chills.  No reported nausea or vomiting.  Had similar issue that required admission July 2024. Presented to the ER afebrile, hemodynamically stable.  Satting well on room air.  White count 12.4, hemoglobin 10.7, platelets 240, lactate 1.1, creatinine 1.64, potassium 5.3.  CT head with stable 3 to 4 mm subdural hematoma.  CT chest, CT C-spine as well as elbow and wrist imaging stable.   Assessment and Plan:  Fall + mechanical fall at ILF w/ noted scalp and RLE abrasion  CT with noted stable prior SDH  Continue fall precautions  PT/OT evaluation  Follow TOC for placement     Cellulitis Trace RLE cellulitis w/ acute on chronic wound s/p fall  WBC 12  Continue ceftriaxone 1 g IV daily De-escalate as appropriate  Wound care consult  Monitor    HTN (hypertension) BP stable  Titrate home regimen  Continue amlodipine 5 mg p.o. daily   CKD (chronic kidney disease), stage IIIb Cr 1.6 w/ GFR in the 30s  At baseline  Monitor      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) + generalized confusion w/ severe agitation or lethargy  Looks to be at baseline  Monitor closely  Follow      SDH (subdural hematoma) (HCC) Stable 3-4 mm low-density left superior convexity Subdural Hematoma since July on CT head  Monitor     Hypothyroid, continue Synthroid  Delirium and Possible sundowning, started Seroquel 25 mg nightly Use Haldol as needed   Body mass index is 18.03 kg/m.  Interventions:  Pressure Injury 11/23/22 Leg Left skin   tear right shin scabbed (Active)  11/23/22 0800  Location: Leg  Location Orientation: Left  Staging:   Wound Description (Comments): skin   tear right shin scabbed  Present on Admission: Yes     Diet: Regular diet DVT Prophylaxis: Subcutaneous Lovenox   Advance goals of care discussion: DNR/DNI limited  Family Communication: family was present at bedside, at the time of interview.  The pt provided permission to discuss medical plan with the family. Opportunity was given to ask question and all questions were answered satisfactorily.   Disposition:  Pt is from ALF, admitted with Fall and cellulitis right lower extremity, still on IV antibiotics, which precludes a safe discharge. Discharge to possible SNF, TBD after PT/OT eval, when stable.  Subjective: Patient was seen and examined at bedside.  No significant events overnight, patient is AO x 2, patient states that he fell, unable to provide much information.  Overall he is fine, denied any complaints.   Physical Exam: General: NAD, lying comfortably Appear in no distress, affect appropriate Eyes: PERRLA ENT: Oral Mucosa Clear, moist  Neck: no JVD,  Cardiovascular: S1 and S2 Present, no Murmur,  Respiratory: good respiratory effort, Bilateral Air  entry equal and Decreased, no Crackles, no wheezes Abdomen: Bowel Sound present, Soft and no tenderness,  Skin: Right lower extremity wound, right scalp abrasion POA Extremities: RLE erythema and mild tenderness, wound with dried blood, no significant edema and  no calf tenderness Neurologic: without any new focal findings Gait not checked due to patient safety concerns  Vitals:   02/25/23 1934 02/26/23 0418 02/26/23 0746 02/26/23 1409  BP: (!) 154/84 (!) 159/83  (!) 159/96 (!) 163/78  Pulse: 71 (!) 59 60 73  Resp: 14 18 15 16   Temp: 98.3 F (36.8 C) 97.6 F (36.4 C) (!) 97.5 F (36.4 C)   TempSrc:  Oral    SpO2: 100% 98% 99%   Weight:      Height:        Intake/Output Summary (Last 24 hours) at 02/26/2023 1501 Last data filed at 02/26/2023 0500 Gross per 24 hour  Intake 240 ml  Output 925 ml  Net -685 ml   Filed Weights   02/25/23 0506  Weight: 62 kg    Data Reviewed: I have personally reviewed and interpreted daily labs, tele strips, imagings as discussed above. I reviewed all nursing notes, pharmacy notes, vitals, pertinent old records I have discussed plan of care as described above with RN and patient/family.  CBC: Recent Labs  Lab 02/25/23 0558 02/26/23 0632  WBC 12.4* 10.1  NEUTROABS 9.9*  --   HGB 10.7* 11.1*  HCT 33.2* 34.1*  MCV 97.9 97.2  PLT 240 247   Basic Metabolic Panel: Recent Labs  Lab 02/25/23 0558 02/25/23 1957 02/26/23 0632 02/26/23 0827  NA 136 136 136  --   K 5.3* 5.0 4.8  --   CL 105 104 103  --   CO2 23 21* 24  --   GLUCOSE 99 117* 115*  --   BUN 39* 35* 32*  --   CREATININE 1.64* 1.56* 1.45*  --   CALCIUM 8.7* 8.5* 8.7*  --   MG  --   --   --  2.2  PHOS  --   --   --  3.3    Studies: No results found.  Scheduled Meds:  amLODipine  5 mg Oral Daily   enoxaparin (LOVENOX) injection  30 mg Subcutaneous Q24H   [START ON 02/27/2023] levothyroxine  50 mcg Oral QAC breakfast   Continuous Infusions:  cefTRIAXone (ROCEPHIN)  IV 1 g (02/26/23 1040)   PRN Meds: acetaminophen, haloperidol lactate, hydrALAZINE, ondansetron **OR** ondansetron (ZOFRAN) IV  Time spent: 35 minutes  Author: Gillis Santa. MD [facility] 02/26/2023 3:01 PM  To reach On-call, see care teams to locate the attending and reach out to them via [email or web address]. If 7PM-7AM, please contact night-coverage If you still have difficulty reaching the attending provider, please page the [facility] (Director on Call) for  [facility] on amion for assistance.

## 2023-02-26 NOTE — Progress Notes (Signed)
Upon entering pts room at 1955 he was found with legs over rail of bed and attempting to get OOB. Helped reposition pt in bed, but pt continued to try to get OOB and was not redirectable. Kept saying "I need to go to that room over there and get in bed." This nurse attempted to orient pt to room with no success. Pt continued attempts to exit bed. IV haldol given per PRN order with positive affect. Pt still attempting to put legs over rail of bed at this time, but is more redirectable than earlier in shift.

## 2023-02-27 DIAGNOSIS — L03115 Cellulitis of right lower limb: Secondary | ICD-10-CM | POA: Diagnosis not present

## 2023-02-27 DIAGNOSIS — W19XXXA Unspecified fall, initial encounter: Secondary | ICD-10-CM | POA: Diagnosis not present

## 2023-02-27 LAB — BASIC METABOLIC PANEL
Anion gap: 10 (ref 5–15)
BUN: 30 mg/dL — ABNORMAL HIGH (ref 8–23)
CO2: 25 mmol/L (ref 22–32)
Calcium: 9.1 mg/dL (ref 8.9–10.3)
Chloride: 101 mmol/L (ref 98–111)
Creatinine, Ser: 1.44 mg/dL — ABNORMAL HIGH (ref 0.61–1.24)
GFR, Estimated: 45 mL/min — ABNORMAL LOW (ref >60)
Glucose, Bld: 118 mg/dL — ABNORMAL HIGH (ref 70–99)
Potassium: 5 mmol/L (ref 3.5–5.1)
Sodium: 136 mmol/L (ref 135–145)

## 2023-02-27 LAB — CBC
HCT: 35.7 % — ABNORMAL LOW (ref 39.0–52.0)
Hemoglobin: 11.9 g/dL — ABNORMAL LOW (ref 13.0–17.0)
MCH: 31.5 pg (ref 26.0–34.0)
MCHC: 33.3 g/dL (ref 30.0–36.0)
MCV: 94.4 fL (ref 80.0–100.0)
Platelets: 280 10*3/uL (ref 150–400)
RBC: 3.78 MIL/uL — ABNORMAL LOW (ref 4.22–5.81)
RDW: 12.9 % (ref 11.5–15.5)
WBC: 14.9 10*3/uL — ABNORMAL HIGH (ref 4.0–10.5)
nRBC: 0 % (ref 0.0–0.2)

## 2023-02-27 LAB — MAGNESIUM: Magnesium: 2.1 mg/dL (ref 1.7–2.4)

## 2023-02-27 LAB — PHOSPHORUS: Phosphorus: 3 mg/dL (ref 2.5–4.6)

## 2023-02-27 LAB — CK: Total CK: 897 U/L — ABNORMAL HIGH (ref 49–397)

## 2023-02-27 MED ORDER — QUETIAPINE FUMARATE 25 MG PO TABS
50.0000 mg | ORAL_TABLET | Freq: Every evening | ORAL | Status: DC
  Administered 2023-02-27 – 2023-02-28 (×2): 50 mg via ORAL
  Filled 2023-02-27 (×3): qty 2

## 2023-02-27 MED ORDER — SODIUM CHLORIDE 0.9 % IV SOLN
INTRAVENOUS | Status: DC
Start: 2023-02-27 — End: 2023-02-28

## 2023-02-27 MED ORDER — VITAMIN D (ERGOCALCIFEROL) 1.25 MG (50000 UNIT) PO CAPS
50000.0000 [IU] | ORAL_CAPSULE | ORAL | Status: DC
  Administered 2023-02-27: 50000 [IU] via ORAL
  Filled 2023-02-27: qty 1

## 2023-02-27 NOTE — Progress Notes (Signed)
Occupational Therapy Treatment Patient Details Name: Jacob Moses MRN: [id number] DOB: 1927-11-29 Today's Date: 02/27/2023   History of present illness Patient is a 89 year old male found down after several hours at ILF. Found to have trace cellulitis with acute on chronic wound of RLE. History of  HTN, gerd, recurrent falls, SDH,  stage 3 CKD,  dementia   OT comments  Pt seen for OT treatment on this date. Upon arrival to room pt supine in bed, agreeable to tx. Pt severely confused during the session. Pt found naked in room and provided Min A for dressing. Pt completed supine<>sit t/f with Min A. Pt required Min A+ HHA x2 for sit<>stand t/f. Pt tolerated 1 min standing and returned to sitting at EOB. Pt attempted standing again and completed 3 side steps towards the [facility] with MIN A+HHA x2. Pt returned to bed and left supine in bed with call bell within reach and bed alarm. RN/NT notified of pt's pure wick off. Pt making good progress toward goals, will continue to follow POC. Discharge recommendation remains appropriate.        If plan is discharge home, recommend the following:  A little help with walking and/or transfers;A little help with bathing/dressing/bathroom;Assistance with cooking/housework;Assist for transportation;Supervision due to cognitive status   Equipment Recommendations  Other (comment)    Recommendations for Other Services      Precautions / Restrictions Precautions Precautions: Fall Restrictions Weight Bearing Restrictions: No       Mobility Bed Mobility Overal bed mobility: Needs Assistance Bed Mobility: Supine to Sit, Sit to Supine     Supine to sit: Min assist Sit to supine: Min assist   General bed mobility comments: increased assistance required this session. cues for technique    Transfers Overall transfer level: Needs assistance Equipment used: 2 person hand held assist Transfers: Sit to/from Stand Sit to Stand: +2 physical assistance, Min  assist           General transfer comment: lifting and lowering assistance required for standing. verbal cues for technique     Balance Overall balance assessment: Needs assistance Sitting-balance support: Feet supported Sitting balance-Leahy Scale: Poor     Standing balance support: Bilateral upper extremity supported Standing balance-Leahy Scale: Poor                             ADL either performed or assessed with clinical judgement   ADL Overall ADL's : Needs assistance/impaired                                     Functional mobility during ADLs: Minimal assistance;+2 for safety/equipment      Extremity/Trunk Assessment Upper Extremity Assessment Upper Extremity Assessment: Overall WFL for tasks assessed   Lower Extremity Assessment Lower Extremity Assessment: Generalized weakness        Vision Patient Visual Report: No change from baseline     Perception     Praxis      Cognition Arousal: Alert Behavior During Therapy: WFL for tasks assessed/performed Overall Cognitive Status: Impaired/Different from baseline                                 General Comments: increased confusion today. patient is disoriented to time and confused about recent events. he is able to follow  single step commands with increased time        Exercises      Shoulder Instructions       General Comments noted two new abrasions to bilateral shins that were present on arrival to the room- patient unaware and RN alerted    Pertinent Vitals/ Pain       Pain Assessment Pain Assessment: No/denies pain  Home Living                                          Prior Functioning/Environment              Frequency  Min 1X/week        Progress Toward Goals  OT Goals(current goals can now be found in the care plan section)  Progress towards OT goals: Progressing toward goals  Acute Rehab OT Goals Patient  Stated Goal: to go home OT Goal Formulation: With patient Time For Goal Achievement: 03/11/23 Potential to Achieve Goals: Fair  Plan      Co-evaluation                 AM-PAC OT "6 Clicks" Daily Activity     Outcome Measure   Help from another person eating meals?: None Help from another person taking care of personal grooming?: A Little Help from another person toileting, which includes using toliet, bedpan, or urinal?: A Little Help from another person bathing (including washing, rinsing, drying)?: A Lot Help from another person to put on and taking off regular upper body clothing?: A Little Help from another person to put on and taking off regular lower body clothing?: A Lot 6 Click Score: 17    End of Session    OT Visit Diagnosis: Unsteadiness on feet (R26.81);Other abnormalities of gait and mobility (R26.89);Muscle weakness (generalized) (M62.81);History of falling (Z91.81)   Activity Tolerance Patient tolerated treatment well   Patient Left in bed;with call bell/phone within reach;with bed alarm set   Nurse Communication Mobility status        Time: 1429-1440 OT Time Calculation (min): 11 min  Charges:    [name], SOT

## 2023-02-27 NOTE — Progress Notes (Signed)
[facility] Progress Note  Patient: Jacob Moses    OEV:[id number]  DOA: 02/25/2023     Date of Service: the patient was seen and examined on 02/27/2023  Chief Complaint  Patient presents with   Fall   Brief hospital course: Jacob Moses is a 89 y.o. male with medical history significant of HTN, gerd, recurrent falls, SDH,  stage 3 CKD,  dementia presenting with fall and cellulitis.  Patient noted to be living at an independent living facility.  Per report, patient with unwitnessed fall.  Patient was down for several hours per report.  Patient unaware of what happened.  Denies any severe headache, chest pain or shortness of breath.  No reported fevers or chills.  No reported nausea or vomiting.  Had similar issue that required admission July 2024. Presented to the ER afebrile, hemodynamically stable.  Satting well on room air.  White count 12.4, hemoglobin 10.7, platelets 240, lactate 1.1, creatinine 1.64, potassium 5.3.  CT head with stable 3 to 4 mm subdural hematoma.  CT chest, CT C-spine as well as elbow and wrist imaging stable.   Assessment and Plan:  Fall + mechanical fall at ILF w/ noted scalp and RLE abrasion  CT with noted stable prior SDH  Continue fall precautions  PT/OT evaluation  Follow TOC for placement     Cellulitis Trace RLE cellulitis w/ acute on chronic wound s/p fall  WBC 12  Continue ceftriaxone 1 g IV daily De-escalate as appropriate  Wound care consult  Monitor    HTN (hypertension) BP stable  Titrate home regimen  Continue amlodipine 5 mg p.o. daily   CKD (chronic kidney disease), stage IIIb Cr 1.6 w/ GFR in the 30s  At baseline  Monitor      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) + generalized confusion w/ severe agitation or lethargy  Looks to be at baseline  Monitor closely  Follow      SDH (subdural hematoma) (HCC) Stable 3-4 mm low-density left superior convexity Subdural Hematoma since July on CT head  Monitor     Hypothyroid, continue Synthroid  Delirium and Possible sundowning, s 10/25 increased Seroquel 50 mg nightly Use Haldol as needed  Elevated CK, continue IV fluid CK 897 Continue to trend CK level  Iron deficiency, transferrin saturation 12%.  Started oral iron supplement. Vitamin D insufficiency, started vitamin D supplement. Vitamin B12 level 246, goal >400.  Started vitamin B12 1000 mcg p.o. daily.   Body mass index is 18.03 kg/m.  Interventions:  Pressure Injury 11/23/22 Leg Left skin   tear right shin scabbed (Active)  11/23/22 0800  Location: Leg  Location Orientation: Left  Staging:   Wound Description (Comments): skin   tear right shin scabbed  Present on Admission: Yes     Diet: Regular diet DVT Prophylaxis: Subcutaneous Lovenox   Advance goals of care discussion: DNR/DNI limited  Family Communication: family was present at bedside, at the time of interview.  The pt provided permission to discuss medical plan with the family. Opportunity was given to ask question and all questions were answered satisfactorily.   Disposition:  Pt is from ALF, admitted with Fall and cellulitis right lower extremity, still on IV antibiotics, which precludes a safe discharge. Discharge to possible SNF, TBD after PT/OT eval, when stable.  Subjective: No significant events overnight, patient was eating breakfast, very hard of hearing, trying to speak with mumbling sound, hard to understand.  Seems to be resting comfortably, denied  any complaints, patient's daughter was at bedside, management plan discussed. Started IV fluid for elevated CK level, most likely discharge in 1 to 2 days to SNF when bed will be available.   Physical Exam: General: NAD, lying comfortably Appear in no distress, affect appropriate Eyes: PERRLA ENT: Oral Mucosa Clear, moist  Neck: no JVD,  Cardiovascular: S1 and S2 Present, no Murmur,  Respiratory: good respiratory effort, Bilateral Air entry equal and  Decreased, no Crackles, no wheezes Abdomen: Bowel Sound present, Soft and no tenderness,  Skin: Right lower extremity wound, right scalp abrasion POA Extremities: RLE erythema and mild tenderness, wound with dried blood, no significant edema and  no calf tenderness Neurologic: without any new focal findings Gait not checked due to patient safety concerns  Vitals:   02/26/23 1409 02/26/23 1614 02/26/23 2014 02/27/23 0803  BP: (!) 163/78 (!) 162/112 (!) 150/84 (!) 151/91  Pulse: 73 71 94 96  Resp: 16 17 18 18   Temp:  (!) 97.5 F (36.4 C) 97.6 F (36.4 C) (!) 97.5 F (36.4 C)  TempSrc:      SpO2:  93% 97% 100%  Weight:      Height:        Intake/Output Summary (Last 24 hours) at 02/27/2023 1343 Last data filed at 02/27/2023 8119 Gross per 24 hour  Intake 100 ml  Output --  Net 100 ml   Filed Weights   02/25/23 0506  Weight: 62 kg    Data Reviewed: I have personally reviewed and interpreted daily labs, tele strips, imagings as discussed above. I reviewed all nursing notes, pharmacy notes, vitals, pertinent old records I have discussed plan of care as described above with RN and patient/family.  CBC: Recent Labs  Lab 02/25/23 0558 02/26/23 0632 02/27/23 0608  WBC 12.4* 10.1 14.9*  NEUTROABS 9.9*  --   --   HGB 10.7* 11.1* 11.9*  HCT 33.2* 34.1* 35.7*  MCV 97.9 97.2 94.4  PLT 240 247 280   Basic Metabolic Panel: Recent Labs  Lab 02/25/23 0558 02/25/23 1957 02/26/23 0632 02/26/23 0827 02/27/23 0608  NA 136 136 136  --  136  K 5.3* 5.0 4.8  --  5.0  CL 105 104 103  --  101  CO2 23 21* 24  --  25  GLUCOSE 99 117* 115*  --  118*  BUN 39* 35* 32*  --  30*  CREATININE 1.64* 1.56* 1.45*  --  1.44*  CALCIUM 8.7* 8.5* 8.7*  --  9.1  MG  --   --   --  2.2 2.1  PHOS  --   --   --  3.3 3.0    Studies: No results found.  Scheduled Meds:  amLODipine  5 mg Oral Daily   vitamin C  500 mg Oral Daily   vitamin B-12  1,000 mcg Oral Daily   enoxaparin (LOVENOX)  injection  30 mg Subcutaneous Q24H   iron polysaccharides  150 mg Oral Daily   levothyroxine  50 mcg Oral QAC breakfast   QUEtiapine  25 mg Oral QPM   Vitamin D (Ergocalciferol)  50,000 Units Oral Q7 days   Continuous Infusions:  sodium chloride 100 mL/hr at 02/27/23 1250   cefTRIAXone (ROCEPHIN)  IV 1 g (02/27/23 1149)   PRN Meds: acetaminophen, haloperidol lactate, hydrALAZINE, ondansetron **OR** ondansetron (ZOFRAN) IV  Time spent: 35 minutes  Author: Gillis Santa. MD [facility] 02/27/2023 1:43 PM  To reach On-call, see care teams to locate the attending  and reach out to them via [email or web address]. If 7PM-7AM, please contact night-coverage If you still have difficulty reaching the attending provider, please page the [facility] (Director on Call) for [facility] on amion for assistance.

## 2023-02-27 NOTE — Plan of Care (Signed)
  Problem: Education: Goal: Knowledge of General Education information will improve Description Including pain rating scale, medication(s)/side effects and non-pharmacologic comfort measures Outcome: Progressing   Problem: Health Behavior/Discharge Planning: Goal: Ability to manage health-related needs will improve Outcome: Progressing   

## 2023-02-27 NOTE — Progress Notes (Signed)
Physical Therapy Treatment Patient Details Name: Jacob Moses MRN: [id number] DOB: May 12, 1927 Today's Date: 02/27/2023   History of Present Illness Patient is a 89 year old male found down after several hours at ILF. Found to have trace cellulitis with acute on chronic wound of RLE. History of  HTN, gerd, recurrent falls, SDH,  stage 3 CKD,  dementia    Jacob Moses Comments  Patient is agreeable to Jacob Moses. He is more confused that previous session but is able to follow single step commands with increased time. Increased assistance required with mobility with dizziness reported with sitting upright and standing. Standing tolerance limited today to ~ 20 seconds. Unable to get standing blood pressure. Continue to anticipate patient will require rehabilitation after this hospital stay < 3 hours/day.     If plan is discharge home, recommend the following: A lot of help with walking and/or transfers;A little help with bathing/dressing/bathroom;Assistance with cooking/housework;Help with stairs or ramp for entrance;Assist for transportation   Can travel by private vehicle     Yes  Equipment Recommendations  Rolling walker (2 wheels)    Recommendations for Other Services       Precautions / Restrictions Precautions Precautions: Fall Restrictions Weight Bearing Restrictions: No     Mobility  Bed Mobility Overal bed mobility: Needs Assistance Bed Mobility: Supine to Sit, Sit to Supine     Supine to sit: Mod assist Sit to supine: Mod assist   General bed mobility comments: increased assistance required this session. cues for technique    Transfers Overall transfer level: Needs assistance Equipment used: Rolling walker (2 wheels) Transfers: Sit to/from Stand Sit to Stand: Mod assist, +2 physical assistance           General transfer comment: lifting and lowering assistance required for standing. verbal cues for technique    Ambulation/Gait               General Gait Details:  unable to due to poor standing tolerance and dizziness reported with standing. unable to stand long enough for blood pressure reading   Stairs             Wheelchair Mobility     Tilt Bed    Modified Rankin (Stroke Patients Only)       Balance Overall balance assessment: Needs assistance Sitting-balance support: Feet supported Sitting balance-Leahy Scale: Poor   Postural control: Posterior lean Standing balance support: Bilateral upper extremity supported Standing balance-Leahy Scale: Poor Standing balance comment: external support required to maintain standing balance with heavy reliance on rolling walker for support                            Cognition Arousal: Alert Behavior During Therapy: WFL for tasks assessed/performed Overall Cognitive Status: Impaired/Different from baseline                                 General Comments: increased confusion today. patient is disoriented to time and confused about recent events. he is able to follow single step commands with increased time        Exercises      General Comments General comments (skin integrity, edema, etc.): noted two new abrasions to bilateral shins that were present on arrival to the room- patient unaware and RN alerted      Pertinent Vitals/Pain Pain Assessment Pain Assessment: No/denies pain    Home Living  Prior Function            Jacob Moses Goals (current goals can now be found in the care plan section) Acute Rehab Jacob Moses Goals Patient Stated Goal: to go home Jacob Moses Goal Formulation: With patient Time For Goal Achievement: 03/11/23 Potential to Achieve Goals: Fair Progress towards Jacob Moses goals: Progressing toward goals    Frequency    Min 1X/week      Jacob Moses Plan      Co-evaluation              AM-PAC Jacob Moses "6 Clicks" Mobility   Outcome Measure  Help needed turning from your back to your side while in a flat bed without using  bedrails?: A Little Help needed moving from lying on your back to sitting on the side of a flat bed without using bedrails?: A Lot Help needed moving to and from a bed to a chair (including a wheelchair)?: A Lot Help needed standing up from a chair using your arms (e.g., wheelchair or bedside chair)?: A Lot Help needed to walk in hospital room?: A Lot Help needed climbing 3-5 steps with a railing? : Total 6 Click Score: 12    End of Session   Activity Tolerance: Patient limited by fatigue Patient left: in bed;with call bell/phone within reach;with bed alarm set Nurse Communication: Mobility status (leg wounds) Jacob Moses Visit Diagnosis: Unsteadiness on feet (R26.81);Muscle weakness (generalized) (M62.81)     Time: 7829-5621 Jacob Moses Time Calculation (min) (ACUTE ONLY): 40 min  Charges:    $Therapeutic Activity: 38-52 mins Jacob Moses General Charges $$ ACUTE Jacob Moses VISIT: 1 Visit                     Jacob Moses, Jacob Moses, Jacob Moses    Ina Homes 02/27/2023, 12:51 PM

## 2023-02-28 DIAGNOSIS — L03115 Cellulitis of right lower limb: Secondary | ICD-10-CM | POA: Diagnosis not present

## 2023-02-28 DIAGNOSIS — W19XXXA Unspecified fall, initial encounter: Secondary | ICD-10-CM | POA: Diagnosis not present

## 2023-02-28 LAB — CBC
HCT: 34.5 % — ABNORMAL LOW (ref 39.0–52.0)
Hemoglobin: 11.2 g/dL — ABNORMAL LOW (ref 13.0–17.0)
MCH: 31.4 pg (ref 26.0–34.0)
MCHC: 32.5 g/dL (ref 30.0–36.0)
MCV: 96.6 fL (ref 80.0–100.0)
Platelets: 256 10*3/uL (ref 150–400)
RBC: 3.57 MIL/uL — ABNORMAL LOW (ref 4.22–5.81)
RDW: 13.2 % (ref 11.5–15.5)
WBC: 10.7 10*3/uL — ABNORMAL HIGH (ref 4.0–10.5)
nRBC: 0 % (ref 0.0–0.2)

## 2023-02-28 LAB — BASIC METABOLIC PANEL
Anion gap: 11 (ref 5–15)
BUN: 36 mg/dL — ABNORMAL HIGH (ref 8–23)
CO2: 21 mmol/L — ABNORMAL LOW (ref 22–32)
Calcium: 8.5 mg/dL — ABNORMAL LOW (ref 8.9–10.3)
Chloride: 105 mmol/L (ref 98–111)
Creatinine, Ser: 1.45 mg/dL — ABNORMAL HIGH (ref 0.61–1.24)
GFR, Estimated: 45 mL/min — ABNORMAL LOW (ref >60)
Glucose, Bld: 92 mg/dL (ref 70–99)
Potassium: 4.8 mmol/L (ref 3.5–5.1)
Sodium: 137 mmol/L (ref 135–145)

## 2023-02-28 LAB — PHOSPHORUS: Phosphorus: 3.5 mg/dL (ref 2.5–4.6)

## 2023-02-28 LAB — MAGNESIUM: Magnesium: 2 mg/dL (ref 1.7–2.4)

## 2023-02-28 LAB — CK: Total CK: 801 U/L — ABNORMAL HIGH (ref 49–397)

## 2023-02-28 MED ORDER — SODIUM CHLORIDE 0.9 % IV SOLN: INTRAVENOUS | Status: AC

## 2023-02-28 NOTE — Progress Notes (Signed)
[facility] Progress Note  Patient: Jacob Moses    WUJ:[id number]  DOA: 02/25/2023     Date of Service: the patient was seen and examined on 02/28/2023  Chief Complaint  Patient presents with   Fall   Brief hospital course: YICHENG RAYER is a 89 y.o. male with medical history significant of HTN, gerd, recurrent falls, SDH,  stage 3 CKD,  dementia presenting with fall and cellulitis.  Patient noted to be living at an independent living facility.  Per report, patient with unwitnessed fall.  Patient was down for several hours per report.  Patient unaware of what happened.  Denies any severe headache, chest pain or shortness of breath.  No reported fevers or chills.  No reported nausea or vomiting.  Had similar issue that required admission July 2024. Presented to the ER afebrile, hemodynamically stable.  Satting well on room air.  White count 12.4, hemoglobin 10.7, platelets 240, lactate 1.1, creatinine 1.64, potassium 5.3.  CT head with stable 3 to 4 mm subdural hematoma.  CT chest, CT C-spine as well as elbow and wrist imaging stable.   Assessment and Plan:  Fall + mechanical fall at ILF w/ noted scalp and RLE abrasion  CT with noted stable prior SDH  Continue fall precautions  PT/OT evaluation  Follow TOC for placement     Cellulitis Trace RLE cellulitis w/ acute on chronic wound s/p fall  WBC 12  Continue ceftriaxone 1 g IV daily De-escalate as appropriate  Wound care consult  Monitor    HTN (hypertension) BP stable  Titrate home regimen  Continue amlodipine 5 mg p.o. daily   CKD (chronic kidney disease), stage IIIb Cr 1.6 w/ GFR in the 30s  At baseline  Monitor      Dementia Pleasantly confused and off and on disorientation Looks to be at baseline  Continue supportive care, fall precautions.   SDH (subdural hematoma) Stable 3-4 mm low-density left superior convexity Subdural Hematoma since July on CT head  Monitor    Hypothyroid, continue  Synthroid  Delirium and Possible sundowning, s 10/25 increased Seroquel 50 mg nightly Use Haldol as needed  Elevated CK, continue IV fluid CK 897--801  Continue to trend CK level  Iron deficiency, transferrin saturation 12%.  Started oral iron supplement. Vitamin D insufficiency, started vitamin D supplement. Vitamin B12 level 246, goal >400.  Started vitamin B12 1000 mcg p.o. daily.   Body mass index is 18.03 kg/m.  Interventions:  Pressure Injury 11/23/22 Leg Left skin   tear right shin scabbed (Active)  11/23/22 0800  Location: Leg  Location Orientation: Left  Staging:   Wound Description (Comments): skin   tear right shin scabbed  Present on Admission: Yes     Diet: Regular diet DVT Prophylaxis: Subcutaneous Lovenox   Advance goals of care discussion: DNR/DNI limited  Family Communication: family was present at bedside, at the time of interview.  The pt provided permission to discuss medical plan with the family. Opportunity was given to ask question and all questions were answered satisfactorily.   Disposition:  Pt is from ALF, admitted with Fall and cellulitis right lower extremity, still on IV antibiotics, which precludes a safe discharge. Discharge to possible SNF, TBD after PT/OT eval, when stable.  Subjective: No significant events overnight, patient was sleepy, he woke up by calling his name, pleasantly confused, seems to be resting comfortably, denied any complaints.  Patient was not aware that breakfast was there, RN was informed to help him  for the feeding. Patient was mumbling, hard to understand. Patient's daughter came around at the bedside, management plan discussed and all question and concerns answered.   Physical Exam: General: NAD, lying comfortably Appear in no distress, affect appropriate Eyes: PERRLA ENT: Oral Mucosa Clear, moist  Neck: no JVD,  Cardiovascular: S1 and S2 Present, no Murmur,  Respiratory: good respiratory effort, Bilateral  Air entry equal and Decreased, no Crackles, no wheezes Abdomen: Bowel Sound present, Soft and no tenderness,  Skin: Right lower extremity wound, right scalp abrasion POA Extremities: RLE erythema and mild tenderness, dressing CDI, no significant edema and  no calf tenderness Neurologic: without any new focal findings, pleasantly confused Gait not checked due to patient safety concerns  Vitals:   02/27/23 1650 02/27/23 1825 02/27/23 2001 02/28/23 0553  BP: (!) 168/111 (!) 169/109 102/63 123/81  Pulse: (!) 119 60 (!) 107 83  Resp: 16 16 18 16   Temp: (!) 97.5 F (36.4 C) 98 F (36.7 C) 97.8 F (36.6 C)   TempSrc:      SpO2: 97% (!) 88% (!) 84%   Weight:      Height:        Intake/Output Summary (Last 24 hours) at 02/28/2023 1337 Last data filed at 02/28/2023 1240 Gross per 24 hour  Intake 360 ml  Output 400 ml  Net -40 ml   Filed Weights   02/25/23 0506  Weight: 62 kg    Data Reviewed: I have personally reviewed and interpreted daily labs, tele strips, imagings as discussed above. I reviewed all nursing notes, pharmacy notes, vitals, pertinent old records I have discussed plan of care as described above with RN and patient/family.  CBC: Recent Labs  Lab 02/25/23 0558 02/26/23 0632 02/27/23 0608 02/28/23 0523  WBC 12.4* 10.1 14.9* 10.7*  NEUTROABS 9.9*  --   --   --   HGB 10.7* 11.1* 11.9* 11.2*  HCT 33.2* 34.1* 35.7* 34.5*  MCV 97.9 97.2 94.4 96.6  PLT 240 247 280 256   Basic Metabolic Panel: Recent Labs  Lab 02/25/23 0558 02/25/23 1957 02/26/23 0632 02/26/23 0827 02/27/23 0608 02/28/23 0523  NA 136 136 136  --  136 137  K 5.3* 5.0 4.8  --  5.0 4.8  CL 105 104 103  --  101 105  CO2 23 21* 24  --  25 21*  GLUCOSE 99 117* 115*  --  118* 92  BUN 39* 35* 32*  --  30* 36*  CREATININE 1.64* 1.56* 1.45*  --  1.44* 1.45*  CALCIUM 8.7* 8.5* 8.7*  --  9.1 8.5*  MG  --   --   --  2.2 2.1 2.0  PHOS  --   --   --  3.3 3.0 3.5    Studies: No results found.   Scheduled Meds:  amLODipine  5 mg Oral Daily   vitamin C  500 mg Oral Daily   vitamin B-12  1,000 mcg Oral Daily   enoxaparin (LOVENOX) injection  30 mg Subcutaneous Q24H   iron polysaccharides  150 mg Oral Daily   levothyroxine  50 mcg Oral QAC breakfast   QUEtiapine  50 mg Oral QPM   Vitamin D (Ergocalciferol)  50,000 Units Oral Q7 days   Continuous Infusions:  sodium chloride 100 mL/hr at 02/28/23 0943   cefTRIAXone (ROCEPHIN)  IV 1 g (02/28/23 1019)   PRN Meds: acetaminophen, haloperidol lactate, hydrALAZINE, ondansetron **OR** ondansetron (ZOFRAN) IV  Time spent: 35 minutes  Author: Gillis Santa.  MD [facility] 02/28/2023 1:37 PM  To reach On-call, see care teams to locate the attending and reach out to them via [email or web address]. If 7PM-7AM, please contact night-coverage If you still have difficulty reaching the attending provider, please page the [facility] (Director on Call) for [facility] on amion for assistance.

## 2023-02-28 NOTE — Plan of Care (Signed)

## 2023-03-01 DIAGNOSIS — L03115 Cellulitis of right lower limb: Secondary | ICD-10-CM | POA: Diagnosis not present

## 2023-03-01 DIAGNOSIS — W19XXXA Unspecified fall, initial encounter: Secondary | ICD-10-CM | POA: Diagnosis not present

## 2023-03-01 LAB — CBC
HCT: 32.2 % — ABNORMAL LOW (ref 39.0–52.0)
Hemoglobin: 10.6 g/dL — ABNORMAL LOW (ref 13.0–17.0)
MCH: 31.7 pg (ref 26.0–34.0)
MCHC: 32.9 g/dL (ref 30.0–36.0)
MCV: 96.4 fL (ref 80.0–100.0)
Platelets: 234 10*3/uL (ref 150–400)
RBC: 3.34 MIL/uL — ABNORMAL LOW (ref 4.22–5.81)
RDW: 13.1 % (ref 11.5–15.5)
WBC: 12.2 10*3/uL — ABNORMAL HIGH (ref 4.0–10.5)
nRBC: 0 % (ref 0.0–0.2)

## 2023-03-01 LAB — BASIC METABOLIC PANEL
Anion gap: 9 (ref 5–15)
BUN: 30 mg/dL — ABNORMAL HIGH (ref 8–23)
CO2: 20 mmol/L — ABNORMAL LOW (ref 22–32)
Calcium: 8.2 mg/dL — ABNORMAL LOW (ref 8.9–10.3)
Chloride: 107 mmol/L (ref 98–111)
Creatinine, Ser: 1.19 mg/dL (ref 0.61–1.24)
GFR, Estimated: 57 mL/min — ABNORMAL LOW (ref >60)
Glucose, Bld: 92 mg/dL (ref 70–99)
Potassium: 4.5 mmol/L (ref 3.5–5.1)
Sodium: 136 mmol/L (ref 135–145)

## 2023-03-01 LAB — CK: Total CK: 1130 U/L — ABNORMAL HIGH (ref 49–397)

## 2023-03-01 LAB — MAGNESIUM: Magnesium: 2 mg/dL (ref 1.7–2.4)

## 2023-03-01 LAB — PHOSPHORUS: Phosphorus: 3.5 mg/dL (ref 2.5–4.6)

## 2023-03-01 MED ORDER — QUETIAPINE FUMARATE 25 MG PO TABS
25.0000 mg | ORAL_TABLET | Freq: Every day | ORAL | Status: DC
  Administered 2023-03-01 – 2023-03-03 (×3): 25 mg via ORAL
  Filled 2023-03-01 (×3): qty 1

## 2023-03-01 MED ORDER — SODIUM CHLORIDE 0.9 % IV SOLN: INTRAVENOUS | Status: DC

## 2023-03-01 MED ORDER — ENOXAPARIN SODIUM 40 MG/0.4ML IJ SOSY
40.0000 mg | PREFILLED_SYRINGE | INTRAMUSCULAR | Status: DC
  Administered 2023-03-01 – 2023-03-02 (×2): 40 mg via SUBCUTANEOUS
  Filled 2023-03-01 (×2): qty 0.4

## 2023-03-01 NOTE — [place] FL2 (Signed)
[place] MEDICAID FL2 LEVEL OF CARE FORM     IDENTIFICATION  Patient Name: Jacob Moses Birthdate: 05/26/1927 Sex: male Admission Date (Current Location): 02/25/2023  [facility] and [place] Number:  Chiropodist and Address:  [facility], [address] [place], [place] [address]      Provider Number: [id number]  Attending Physician Name and Address:  Gillis Santa, MD  Relative Name and Phone Number:  Carolann Littler (Daughter)  [phone] (Mobile)    Current Level of Care:  Hospital Recommended Level of Care: Skilled Nursing Facility Prior Approval Number: [id number]  Date Approved/Denied:   PASRR Number: [id number]  Discharge Plan: Home    Current Diagnoses: Patient Active Problem List   Diagnosis Date Noted   Fall 02/25/2023   Cellulitis 02/25/2023   Hyperkalemia 11/20/2022   Syncope 11/20/2022   Rhabdomyolysis 11/20/2022   Opacity of lung on imaging study 11/20/2022   Benign prostatic hyperplasia with lower urinary tract symptoms 01/01/2021   Sepsis due to gram-negative UTI (HCC) 12/14/2020   Sepsis (HCC) 12/14/2020   Bladder outlet obstruction 12/04/2020   Hyponatremia 12/03/2020   Closed right hip fracture (HCC) 11/29/2020   Fall at home, initial encounter 11/29/2020   Bradycardia 11/29/2020   HTN (hypertension) 11/29/2020   GERD (gastroesophageal reflux disease) 11/29/2020   Anxiety 11/29/2020   CKD (chronic kidney disease), stage IIIb 11/29/2020   Iron deficiency anemia 11/29/2020   Age-related osteoporosis without current pathological fracture 05/05/2020   Nondependent alcohol abuse, in remission 05/05/2020   Moderate aortic insufficiency 05/18/2019   Peripheral edema 04/06/2019   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) 02/28/2019   Insomnia, unspecified 02/25/2019   Protein-calorie malnutrition, severe 02/24/2019   SDH (subdural hematoma) (HCC) 02/22/2019    Orientation RESPIRATION BLADDER Height  & Weight     Self, Place  Normal Incontinent Weight: 62 kg Height:  6\' 1"  (185.4 cm)  BEHAVIORAL SYMPTOMS/MOOD NEUROLOGICAL BOWEL NUTRITION STATUS      Continent Diet  AMBULATORY STATUS COMMUNICATION OF NEEDS Skin   Extensive Assist (HIGH FALL RISK) Verbally Skin abrasions                       Personal Care Assistance Level of Assistance  Bathing, Feeding, Dressing Bathing Assistance: Maximum assistance Feeding assistance: Limited assistance Dressing Assistance: Maximum assistance     Functional Limitations Info  Sight, Hearing Sight Info: Impaired Hearing Info: Adequate      SPECIAL CARE FACTORS FREQUENCY  PT (By licensed PT), OT (By licensed OT)     PT Frequency: 5x/week OT Frequency: 5x/week            Contractures Contractures Info: Not present    Additional Factors Info  Code Status, Allergies Code Status Info: DNR-Limited. Please see chart for details. Allergies Info: No known allergies as of this date.           Current Medications (03/01/2023):  This is the current hospital active medication list Current Facility-Administered Medications  Medication Dose Route Frequency Provider Last Rate Last Admin   0.9 %  sodium chloride infusion   Intravenous Continuous Gillis Santa, MD 100 mL/hr at 03/01/23 1003 New Bag at 03/01/23 1003   acetaminophen (TYLENOL) tablet 650 mg  650 mg Oral Q6H PRN Floydene Flock, MD   650 mg at 02/26/23 0850   amLODipine (NORVASC) tablet 5 mg  5 mg Oral Daily Gillis Santa, MD   5 mg at 03/01/23 1004   ascorbic  acid (VITAMIN C) tablet 500 mg  500 mg Oral Daily Gillis Santa, MD   500 mg at 03/01/23 1004   cefTRIAXone (ROCEPHIN) 1 g in sodium chloride 0.9 % 100 mL IVPB  1 g Intravenous Q24H Floydene Flock, MD 200 mL/hr at 03/01/23 1014 1 g at 03/01/23 1014   cyanocobalamin (VITAMIN B12) tablet 1,000 mcg  1,000 mcg Oral Daily Gillis Santa, MD   1,000 mcg at 03/01/23 1004   enoxaparin (LOVENOX) injection 40 mg  40 mg  Subcutaneous Q24H Lowella Bandy, RPH   40 mg at 03/01/23 1004   haloperidol lactate (HALDOL) injection 2 mg  2 mg Intravenous Q6H PRN Gillis Santa, MD   2 mg at 02/28/23 2130   hydrALAZINE (APRESOLINE) tablet 50 mg  50 mg Oral Q6H PRN Gillis Santa, MD       iron polysaccharides (NIFEREX) capsule 150 mg  150 mg Oral Daily Gillis Santa, MD   150 mg at 03/01/23 1004   levothyroxine (SYNTHROID) tablet 50 mcg  50 mcg Oral QAC breakfast Gillis Santa, MD   50 mcg at 03/01/23 0553   ondansetron (ZOFRAN) tablet 4 mg  4 mg Oral Q6H PRN Floydene Flock, MD       Or   ondansetron [facility]) injection 4 mg  4 mg Intravenous Q6H PRN Floydene Flock, MD   4 mg at 02/26/23 1136   QUEtiapine (SEROQUEL) tablet 25 mg  25 mg Oral Daily Gillis Santa, MD   25 mg at 03/01/23 1340   QUEtiapine (SEROQUEL) tablet 50 mg  50 mg Oral QPM Gillis Santa, MD   50 mg at 02/28/23 1858   Vitamin D (Ergocalciferol) (DRISDOL) 1.25 MG (50000 UNIT) capsule 50,000 Units  50,000 Units Oral Q7 days Gillis Santa, MD   50,000 Units at 02/27/23 1124     Discharge Medications: Please see discharge summary for a list of discharge medications.  Relevant Imaging Results:  Relevant Lab Results:   Additional Information SSN: [id number]. Pt condused: ContactCarolann Littler (Daughter)  [phone] (Mobile)  Bing Quarry, RN

## 2023-03-01 NOTE — TOC Progression Note (Addendum)
Transition of Care [facility]) - Progression Note    Patient Details  Name: BUCK ILGENFRITZ MRN: [id number] Date of Birth: [birth year]  Transition [facility] CM/SW Contact  Bing Quarry, RN Phone Number: 03/01/2023, 4:53 PM  Clinical Narrative:  10/27: Reached daughter at home. Patient was most recently at [facility] in July 2024. Has htx of frequent falls. Has also been at [facility] and [facility]. She feels he will need transitioning to ALF after STR if that. At the [place] they provide a cooked lunch, biweekly house cleaning, garbage valet, laundry of towels and lines once a week. Agreed to send out to local to [facility], but not [facility].  Prior PASRR #[id number]. Bed seach initiated excluding [facility].   Gabriel Cirri MSN RN CM  Care Management Department.  [place]  [facility] Direct Dial: [phone] Main Office Phone: [phone] Weekends Only         Expected Discharge Plan and Services                                               Social Determinants of Health (SDOH) Interventions SDOH Screenings   Food Insecurity: No Food Insecurity (02/25/2023)  Housing: Patient Declined (02/25/2023)  Transportation Needs: No Transportation Needs (02/25/2023)  Utilities: Not At Risk (02/25/2023)  Tobacco Use: Medium Risk (11/20/2022)    Readmission Risk Interventions     No data to display

## 2023-03-01 NOTE — Progress Notes (Signed)
[facility] Progress Note  Patient: Jacob Moses    XBJ:[id number]  DOA: 02/25/2023     Date of Service: the patient was seen and examined on 03/01/2023  Chief Complaint  Patient presents with   Fall   Brief hospital course: Jacob Moses is a 89 y.o. male with medical history significant of HTN, gerd, recurrent falls, SDH,  stage 3 CKD,  dementia presenting with fall and cellulitis.  Patient noted to be living at an independent living facility.  Per report, patient with unwitnessed fall.  Patient was down for several hours per report.  Patient unaware of what happened.  Denies any severe headache, chest pain or shortness of breath.  No reported fevers or chills.  No reported nausea or vomiting.  Had similar issue that required admission July 2024. Presented to the ER afebrile, hemodynamically stable.  Satting well on room air.  White count 12.4, hemoglobin 10.7, platelets 240, lactate 1.1, creatinine 1.64, potassium 5.3.  CT head with stable 3 to 4 mm subdural hematoma.  CT chest, CT C-spine as well as elbow and wrist imaging stable.   Assessment and Plan:  Fall + mechanical fall at ILF w/ noted scalp and RLE abrasion  CT with noted stable prior SDH  Continue fall precautions  PT/OT evaluation  Follow TOC for placement     Cellulitis Trace RLE cellulitis w/ acute on chronic wound s/p fall  WBC 12  Continue ceftriaxone 1 g IV daily De-escalate as appropriate  Wound care consult  Monitor    HTN (hypertension) BP stable  Titrate home regimen  Continue amlodipine 5 mg p.o. daily   CKD (chronic kidney disease), stage IIIb Cr 1.6 w/ GFR in the 30s  At baseline  Monitor      Dementia Pleasantly confused and off and on disorientation Looks to be at baseline  Continue supportive care, fall precautions.   SDH (subdural hematoma) Stable 3-4 mm low-density left superior convexity Subdural Hematoma since July on CT head  Monitor    Hypothyroid, continue  Synthroid  Delirium and Possible sundowning,  10/25 increased Seroquel 50 mg nightly 10/27 started Seroquel 25 mg p.o. daily, due to delirium and persistent confusion Use Haldol as needed  Elevated CK, continue IV fluid CK 897--801 --1130 Continue to trend CK level  Iron deficiency, transferrin saturation 12%.  Started oral iron supplement. Vitamin D insufficiency, started vitamin D supplement. Vitamin B12 level 246, goal >400.  Started vitamin B12 1000 mcg p.o. daily.   Body mass index is 18.03 kg/m.  Interventions:  Pressure Injury 11/23/22 Leg Left skin   tear right shin scabbed (Active)  11/23/22 0800  Location: Leg  Location Orientation: Left  Staging:   Wound Description (Comments): skin   tear right shin scabbed  Present on Admission: Yes     Diet: Regular diet DVT Prophylaxis: Subcutaneous Lovenox   Advance goals of care discussion: DNR/DNI limited  Family Communication: family was present at bedside, at the time of interview.  The pt provided permission to discuss medical plan with the family. Opportunity was given to ask question and all questions were answered satisfactorily.   Disposition:  Pt is from ALF, admitted with Fall and cellulitis right lower extremity, still on IV antibiotics, which precludes a safe discharge. Discharge to possible SNF, TBD after PT/OT eval, when stable.  Subjective: No significant events overnight, patient is awake, pleasantly confused, he is not keeping his count on, lying in a good but he was covered with a  sheet.  Without any acute distress, seems to be laying comfortably, unable to offer any complaints.   Physical Exam: General: NAD, lying comfortably Appear in no distress, affect appropriate Eyes: PERRLA ENT: Oral Mucosa Clear, moist  Neck: no JVD,  Cardiovascular: S1 and S2 Present, no Murmur,  Respiratory: good respiratory effort, Bilateral Air entry equal and Decreased, no Crackles, no wheezes Abdomen: Bowel Sound  present, Soft and no tenderness,  Skin: Right lower extremity wound, right scalp abrasion POA Extremities: RLE erythema and mild tenderness, dressing CDI, no significant edema and  no calf tenderness Neurologic: without any new focal findings, pleasantly confused Gait not checked due to patient safety concerns  Vitals:   02/28/23 0553 02/28/23 1947 03/01/23 0503 03/01/23 0804  BP: 123/81 (!) 145/129 136/65 (!) 158/73  Pulse: 83 73 70 78  Resp: 16 16 14 16   Temp:  98.4 F (36.9 C) 97.9 F (36.6 C) 97.7 F (36.5 C)  TempSrc:      SpO2:  (!) 78% 98% 99%  Weight:      Height:       No intake or output data in the 24 hours ending 03/01/23 1253  Filed Weights   02/25/23 0506  Weight: 62 kg    Data Reviewed: I have personally reviewed and interpreted daily labs, tele strips, imagings as discussed above. I reviewed all nursing notes, pharmacy notes, vitals, pertinent old records I have discussed plan of care as described above with RN and patient/family.  CBC: Recent Labs  Lab 02/25/23 0558 02/26/23 0632 02/27/23 0608 02/28/23 0523 03/01/23 0557  WBC 12.4* 10.1 14.9* 10.7* 12.2*  NEUTROABS 9.9*  --   --   --   --   HGB 10.7* 11.1* 11.9* 11.2* 10.6*  HCT 33.2* 34.1* 35.7* 34.5* 32.2*  MCV 97.9 97.2 94.4 96.6 96.4  PLT 240 247 280 256 234   Basic Metabolic Panel: Recent Labs  Lab 02/25/23 1957 02/26/23 0632 02/26/23 0827 02/27/23 0608 02/28/23 0523 03/01/23 0557  NA 136 136  --  136 137 136  K 5.0 4.8  --  5.0 4.8 4.5  CL 104 103  --  101 105 107  CO2 21* 24  --  25 21* 20*  GLUCOSE 117* 115*  --  118* 92 92  BUN 35* 32*  --  30* 36* 30*  CREATININE 1.56* 1.45*  --  1.44* 1.45* 1.19  CALCIUM 8.5* 8.7*  --  9.1 8.5* 8.2*  MG  --   --  2.2 2.1 2.0 2.0  PHOS  --   --  3.3 3.0 3.5 3.5    Studies: No results found.  Scheduled Meds:  amLODipine  5 mg Oral Daily   vitamin C  500 mg Oral Daily   vitamin B-12  1,000 mcg Oral Daily   enoxaparin (LOVENOX)  injection  40 mg Subcutaneous Q24H   iron polysaccharides  150 mg Oral Daily   levothyroxine  50 mcg Oral QAC breakfast   QUEtiapine  50 mg Oral QPM   Vitamin D (Ergocalciferol)  50,000 Units Oral Q7 days   Continuous Infusions:  sodium chloride 100 mL/hr at 03/01/23 1003   cefTRIAXone (ROCEPHIN)  IV 1 g (03/01/23 1014)   PRN Meds: acetaminophen, haloperidol lactate, hydrALAZINE, ondansetron **OR** ondansetron (ZOFRAN) IV  Time spent: 35 minutes  Author: Gillis Santa. MD [facility] 03/01/2023 12:53 PM  To reach On-call, see care teams to locate the attending and reach out to them via [email or web address]. If 7PM-7AM, please  contact night-coverage If you still have difficulty reaching the attending provider, please page the [facility] (Director on Call) for [facility] on amion for assistance.

## 2023-03-02 ENCOUNTER — Inpatient Hospital Stay: Payer: Medicare Other

## 2023-03-02 DIAGNOSIS — L03115 Cellulitis of right lower limb: Secondary | ICD-10-CM | POA: Diagnosis not present

## 2023-03-02 DIAGNOSIS — W19XXXA Unspecified fall, initial encounter: Secondary | ICD-10-CM | POA: Diagnosis not present

## 2023-03-02 LAB — CBC
HCT: 33.9 % — ABNORMAL LOW (ref 39.0–52.0)
Hemoglobin: 11.2 g/dL — ABNORMAL LOW (ref 13.0–17.0)
MCH: 31.7 pg (ref 26.0–34.0)
MCHC: 33 g/dL (ref 30.0–36.0)
MCV: 96 fL (ref 80.0–100.0)
Platelets: 266 10*3/uL (ref 150–400)
RBC: 3.53 MIL/uL — ABNORMAL LOW (ref 4.22–5.81)
RDW: 13 % (ref 11.5–15.5)
WBC: 12.6 10*3/uL — ABNORMAL HIGH (ref 4.0–10.5)
nRBC: 0 % (ref 0.0–0.2)

## 2023-03-02 LAB — BASIC METABOLIC PANEL
Anion gap: 9 (ref 5–15)
BUN: 27 mg/dL — ABNORMAL HIGH (ref 8–23)
CO2: 22 mmol/L (ref 22–32)
Calcium: 8.6 mg/dL — ABNORMAL LOW (ref 8.9–10.3)
Chloride: 104 mmol/L (ref 98–111)
Creatinine, Ser: 1.09 mg/dL (ref 0.61–1.24)
GFR, Estimated: >60 mL/min (ref >60)
Glucose, Bld: 87 mg/dL (ref 70–99)
Potassium: 4.3 mmol/L (ref 3.5–5.1)
Sodium: 135 mmol/L (ref 135–145)

## 2023-03-02 LAB — CULTURE, BLOOD (ROUTINE X 2)
Culture: NO GROWTH
Culture: NO GROWTH

## 2023-03-02 LAB — CK: Total CK: 1043 U/L — ABNORMAL HIGH (ref 49–397)

## 2023-03-02 MED ORDER — SODIUM CHLORIDE 0.9 % IV SOLN: INTRAVENOUS | Status: AC

## 2023-03-02 NOTE — Progress Notes (Signed)
Physical Therapy Treatment Patient Details Name: Jacob Moses MRN: [id number] DOB: May 28, 1927 Today's Date: 03/02/2023   History of Present Illness Patient is a 89 year old male found down after several hours at ILF. Found to have trace cellulitis with acute on chronic wound of RLE. History of  HTN, gerd, recurrent falls, SDH,  stage 3 CKD,  dementia    PT Comments  Patient is confused but cooperative during session. He continues to require assistance with bed mobility. Sitting balance and standing balance are poor with posterior lean. Max A required for standing with limited standing tolerance. Unable to progress to walking at this time due to generalized weakness, poor standing tolerance. Continue to recommend rehabilitation <3 hours/day after this hospital stay.    If plan is discharge home, recommend the following: A lot of help with walking and/or transfers;A little help with bathing/dressing/bathroom;Assistance with cooking/housework;Help with stairs or ramp for entrance;Assist for transportation   Can travel by private vehicle     No  Equipment Recommendations  None recommended by PT (to be determined at next level of care)    Recommendations for Other Services       Precautions / Restrictions Precautions Precautions: Fall Restrictions Weight Bearing Restrictions: No     Mobility  Bed Mobility Overal bed mobility: Needs Assistance Bed Mobility: Supine to Sit, Sit to Supine     Supine to sit: Max assist Sit to supine: Mod assist   General bed mobility comments: assistance for trunk and BLE support. verbal cues for task initiation and sequencing. increased time and effort required    Transfers Overall transfer level: Needs assistance Equipment used: Rolling walker (2 wheels) Transfers: Sit to/from Stand Sit to Stand: Max assist           General transfer comment: lifting and lowering assitance provided. cues for foot positioning and anterior weight  shifting    Ambulation/Gait               General Gait Details: unable to at this time due to poor standing tolerance and posterior lean with standing   Stairs             Wheelchair Mobility     Tilt Bed    Modified Rankin (Stroke Patients Only)       Balance Overall balance assessment: Needs assistance Sitting-balance support: Feet supported Sitting balance-Leahy Scale: Poor Sitting balance - Comments: poor initially progressing to fair with increased sitting time and faciliation for anterior weight shifting Postural control: Posterior lean Standing balance support: Bilateral upper extremity supported Standing balance-Leahy Scale: Poor Standing balance comment: external support required, patient leaning posteriorly throughout                            Cognition Arousal: Alert Behavior During Therapy: WFL for tasks assessed/performed Overall Cognitive Status: Impaired/Different from baseline                                 General Comments: impaired short term memory. patient is confused but able to follow single step commands with increased time and repetition. disoriented to place, time, siutation even with frequent reorientation. patient is very Engineer, civil (consulting) Comments        Pertinent Vitals/Pain Pain Assessment Pain Assessment: No/denies pain    Home Living  Prior Function            PT Goals (current goals can now be found in the care plan section) Acute Rehab PT Goals Patient Stated Goal: to go home PT Goal Formulation: With patient Time For Goal Achievement: 03/11/23 Potential to Achieve Goals: Fair Progress towards PT goals: Progressing toward goals    Frequency    Min 1X/week      PT Plan      Co-evaluation              AM-PAC PT "6 Clicks" Mobility   Outcome Measure  Help needed turning from your back to your side while in a  flat bed without using bedrails?: A Lot Help needed moving from lying on your back to sitting on the side of a flat bed without using bedrails?: A Lot Help needed moving to and from a bed to a chair (including a wheelchair)?: A Lot Help needed standing up from a chair using your arms (e.g., wheelchair or bedside chair)?: A Lot Help needed to walk in hospital room?: Total Help needed climbing 3-5 steps with a railing? : Total 6 Click Score: 10    End of Session   Activity Tolerance: Patient limited by fatigue Patient left: in bed;with call bell/phone within reach;with bed alarm set Nurse Communication: Mobility status PT Visit Diagnosis: Unsteadiness on feet (R26.81);Muscle weakness (generalized) (M62.81)     Time: 8469-6295 PT Time Calculation (min) (ACUTE ONLY): 18 min  Charges:    $Therapeutic Activity: 8-22 mins PT General Charges $$ ACUTE PT VISIT: 1 Visit                     Donna Bernard, PT, MPT    Ina Homes 03/02/2023, 9:01 AM

## 2023-03-02 NOTE — Progress Notes (Signed)
[facility] Progress Note  Patient: Jacob Moses    ZOX:[id number]  DOA: 02/25/2023     Date of Service: the patient was seen and examined on 03/02/2023  Chief Complaint  Patient presents with   Fall   Brief hospital course: Jacob Moses is a 89 y.o. male with medical history significant of HTN, gerd, recurrent falls, SDH,  stage 3 CKD,  dementia presenting with fall and cellulitis.  Patient noted to be living at an independent living facility.  Per report, patient with unwitnessed fall.  Patient was down for several hours per report.  Patient unaware of what happened.  Denies any severe headache, chest pain or shortness of breath.  No reported fevers or chills.  No reported nausea or vomiting.  Had similar issue that required admission July 2024. Presented to the ER afebrile, hemodynamically stable.  Satting well on room air.  White count 12.4, hemoglobin 10.7, platelets 240, lactate 1.1, creatinine 1.64, potassium 5.3.  CT head with stable 3 to 4 mm subdural hematoma.  CT chest, CT C-spine as well as elbow and wrist imaging stable.   Assessment and Plan:  Fall + mechanical fall at ILF w/ noted scalp and RLE abrasion  CT with noted stable prior SDH  Continue fall precautions  PT/OT evaluation  Follow TOC for placement     Cellulitis Trace RLE cellulitis w/ acute on chronic wound s/p fall  WBC 12  Continue ceftriaxone 1 g IV daily De-escalate as appropriate  Wound care consult  Monitor    HTN (hypertension) BP stable  Titrate home regimen  Continue amlodipine 5 mg p.o. daily   CKD (chronic kidney disease), stage IIIb Cr 1.6 w/ GFR in the 30s  At baseline  Monitor      Dementia Pleasantly confused and off and on disorientation Looks to be at baseline  Continue supportive care, fall precautions.   SDH (subdural hematoma) Stable 3-4 mm low-density left superior convexity Subdural Hematoma since July on CT head  10/28 C T Head: 1. No acute intracranial  process. Unchanged low-density subdural collection along the left superior cerebral convexity, which measures up to 4 mm. No significant mass effect or midline shift. Monitor     Hypothyroid, continue Synthroid  Delirium and Possible sundowning,  10/25 increased Seroquel 50 mg nightly 10/27 started Seroquel 25 mg p.o. daily, due to delirium and persistent confusion Use Haldol as needed  Elevated CK, continue IV fluid CK 897--801 --1130--1043 Continue to trend CK level  Iron deficiency, transferrin saturation 12%.  Started oral iron supplement. Vitamin D insufficiency, started vitamin D supplement. Vitamin B12 level 246, goal >400.  Started vitamin B12 1000 mcg p.o. daily.   Body mass index is 18.03 kg/m.  Interventions:  Pressure Injury 11/23/22 Leg Left skin   tear right shin scabbed (Active)  11/23/22 0800  Location: Leg  Location Orientation: Left  Staging:   Wound Description (Comments): skin   tear right shin scabbed  Present on Admission: Yes     Diet: Regular diet DVT Prophylaxis: Subcutaneous Lovenox   Advance goals of care discussion: DNR/DNI limited  Family Communication: family was present at bedside, at the time of interview.  The pt provided permission to discuss medical plan with the family. Opportunity was given to ask question and all questions were answered satisfactorily.   Disposition:  Pt is from ALF, admitted with Fall and cellulitis right lower extremity, still on IV antibiotics, which precludes a safe discharge. Discharge to possible SNF, PT/OT  eval done, recommend SNF placement.  CK level is elevated, patient is on IV fluid Most likely discharge to SNF in 1 to 2 days Follow TOC for disposition plan   Subjective: No significant events overnight, patient was resting comfortably, denied any complaints. Patient's daughter was at bedside, management plan discussed.    Physical Exam: General: NAD, lying comfortably Appear in no distress, affect  appropriate Eyes: PERRLA ENT: Oral Mucosa Clear, moist  Neck: no JVD,  Cardiovascular: S1 and S2 Present, no Murmur,  Respiratory: good respiratory effort, Bilateral Air entry equal and Decreased, no Crackles, no wheezes Abdomen: Bowel Sound present, Soft and no tenderness,  Skin: Right lower extremity wound, right scalp abrasion POA Extremities: RLE erythema and mild tenderness, dressing CDI, no significant edema and  no calf tenderness Neurologic: without any new focal findings, pleasantly confused Gait not checked due to patient safety concerns  Vitals:   03/01/23 1540 03/01/23 1606 03/01/23 2014 03/02/23 0815  BP: 127/85 (!) 129/90 (!) 146/97 (!) 159/94  Pulse: 93 97 (!) 59 90  Resp: 19 16 14 19   Temp: 97.9 F (36.6 C) 97.9 F (36.6 C) 98.6 F (37 C) (!) 97.5 F (36.4 C)  TempSrc:   Oral   SpO2: 95% (!) 75% (!) 83% 99%  Weight:      Height:       No intake or output data in the 24 hours ending 03/02/23 1340  Filed Weights   02/25/23 0506  Weight: 62 kg    Data Reviewed: I have personally reviewed and interpreted daily labs, tele strips, imagings as discussed above. I reviewed all nursing notes, pharmacy notes, vitals, pertinent old records I have discussed plan of care as described above with RN and patient/family.  CBC: Recent Labs  Lab 02/25/23 0558 02/26/23 4098 02/27/23 0608 02/28/23 0523 03/01/23 0557 03/02/23 0832  WBC 12.4* 10.1 14.9* 10.7* 12.2* 12.6*  NEUTROABS 9.9*  --   --   --   --   --   HGB 10.7* 11.1* 11.9* 11.2* 10.6* 11.2*  HCT 33.2* 34.1* 35.7* 34.5* 32.2* 33.9*  MCV 97.9 97.2 94.4 96.6 96.4 96.0  PLT 240 247 280 256 234 266   Basic Metabolic Panel: Recent Labs  Lab 02/26/23 0632 02/26/23 0827 02/27/23 0608 02/28/23 0523 03/01/23 0557 03/02/23 0832  NA 136  --  136 137 136 135  K 4.8  --  5.0 4.8 4.5 4.3  CL 103  --  101 105 107 104  CO2 24  --  25 21* 20* 22  GLUCOSE 115*  --  118* 92 92 87  BUN 32*  --  30* 36* 30* 27*   CREATININE 1.45*  --  1.44* 1.45* 1.19 1.09  CALCIUM 8.7*  --  9.1 8.5* 8.2* 8.6*  MG  --  2.2 2.1 2.0 2.0  --   PHOS  --  3.3 3.0 3.5 3.5  --     Studies: CT HEAD WO CONTRAST ( )  Result Date: 03/02/2023 CLINICAL DATA:  Stroke suspected, right arm weakness EXAM: CT HEAD WITHOUT CONTRAST TECHNIQUE: Contiguous axial images were obtained from the base of the skull through the vertex without intravenous contrast. RADIATION DOSE REDUCTION: This exam was performed according to the departmental dose-optimization program which includes automated exposure control, adjustment of the mA and/or kV according to patient size and/or use of iterative reconstruction technique. COMPARISON:  02/25/2023 FINDINGS: Brain: Redemonstrated low-density subdural collection along the left superior cerebral convexity, which measures up to 4 mm,  unchanged. No significant mass effect or midline shift. No evidence of acute infarction, hemorrhage, mass, or hydrocephalus. Periventricular white matter changes, likely the sequela of chronic small vessel ischemic disease. 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 Vascular: No hyperdense vessel. Skull: Negative for fracture or focal lesion. Sinuses/Orbits: Left maxillary mucous retention cyst. Status post bilateral lens replacements. Other: The mastoid air cells are well aerated. IMPRESSION: 1. No acute intracranial process. 2. Unchanged low-density subdural collection along the left superior cerebral convexity, which measures up to 4 mm. No significant mass effect or midline shift. Electronically Signed   By: Wiliam Ke M.D.   On: 03/02/2023 13:27    Scheduled Meds:  amLODipine  5 mg Oral Daily   vitamin C  500 mg Oral Daily   vitamin B-12  1,000 mcg Oral Daily   enoxaparin (LOVENOX) injection  40 mg Subcutaneous Q24H   iron polysaccharides  150 mg Oral Daily   levothyroxine  50 mcg Oral QAC breakfast   QUEtiapine  25 mg Oral Daily   QUEtiapine  50 mg Oral QPM   Vitamin  D (Ergocalciferol)  50,000 Units Oral Q7 days   Continuous Infusions:  sodium chloride 100 mL/hr at 03/02/23 1013   cefTRIAXone (ROCEPHIN)  IV 1 g (03/02/23 1027)   PRN Meds: acetaminophen, haloperidol lactate, hydrALAZINE, ondansetron **OR** ondansetron (ZOFRAN) IV  Time spent: 35 minutes  Author: Gillis Santa. MD [facility] 03/02/2023 1:40 PM  To reach On-call, see care teams to locate the attending and reach out to them via [email or web address]. If 7PM-7AM, please contact night-coverage If you still have difficulty reaching the attending provider, please page the [facility] (Director on Call) for [facility] on amion for assistance.

## 2023-03-02 NOTE — TOC Progression Note (Signed)
Transition of Care [facility]) - Progression Note    Patient Details  Name: Jacob Moses MRN: [id number] Date of Birth: 06/05/1927  [facility] of [facility]) CM/SW Contact  Allena Katz, LCSW Phone Number: 03/02/2023, 3:28 PM  Clinical Narrative:   CSW spoke with Lupita Leash who reports that she would like to send patient to [facility] and this is what patient would like as well. Gavin Pound reports pt can discharge tomorrow.          Expected Discharge Plan and Services                                               Social Determinants of Health (SDOH) Interventions SDOH Screenings   Food Insecurity: No Food Insecurity (02/25/2023)  Housing: Patient Declined (02/25/2023)  Transportation Needs: No Transportation Needs (02/25/2023)  Utilities: Not At Risk (02/25/2023)  Tobacco Use: Medium Risk (11/20/2022)    Readmission Risk Interventions     No data to display

## 2023-03-02 NOTE — Discharge Instructions (Signed)
Placement agencies  [facility] [phone]  [facility] [phone]

## 2023-03-02 NOTE — Plan of Care (Signed)
  Problem: Education: Goal: Knowledge of General Education information will improve Description: Including pain rating scale, medication(s)/side effects and non-pharmacologic comfort measures Outcome: Progressing   Problem: Health Behavior/Discharge Planning: Goal: Ability to manage health-related needs will improve Outcome: Progressing   Problem: Clinical Measurements: Goal: Ability to maintain clinical measurements within normal limits will improve Outcome: Progressing Goal: Will remain free from infection Outcome: Progressing Goal: Diagnostic test results will improve Outcome: Progressing Goal: Respiratory complications will improve Outcome: Progressing Goal: Cardiovascular complication will be avoided Outcome: Progressing   Problem: Activity: Goal: Risk for activity intolerance will decrease Outcome: Progressing   Problem: Nutrition: Goal: Adequate nutrition will be maintained Outcome: Progressing   Problem: Coping: Goal: Level of anxiety will decrease Outcome: Progressing   Problem: Elimination: Goal: Will not experience complications related to bowel motility Outcome: Progressing Goal: Will not experience complications related to urinary retention Outcome: Progressing   Problem: Pain Management: Goal: General experience of comfort will improve Outcome: Progressing   Problem: Safety: Goal: Ability to remain free from injury will improve Outcome: Progressing   Problem: Skin Integrity: Goal: Risk for impaired skin integrity will decrease Outcome: Progressing   Problem: Education: Goal: Knowledge of disease and its progression will improve Outcome: Progressing   Problem: Fluid Volume: Goal: Compliance with measures to maintain balanced fluid volume will improve Outcome: Progressing   Problem: Nutritional: Goal: Ability to make healthy dietary choices will improve Outcome: Progressing

## 2023-03-02 NOTE — Progress Notes (Signed)
Occupational Therapy Treatment Patient Details Name: JAKARIE ACKERMANN MRN: [id number] DOB: 10-May-1927 Today's Date: 03/02/2023   History of present illness Patient is a 89 year old male found down after several hours at ILF. Found to have trace cellulitis with acute on chronic wound of RLE. History of  HTN, gerd, recurrent falls, SDH,  stage 3 CKD,  dementia   OT comments  Pt function has declined since last session; continues to have confusion. Now presenting with decreased use of RUE functionally, although is able to follow command to raise RUE. See below for details.  Patient will benefit from continued OT while in acute care.       If plan is discharge home, recommend the following:  Two people to help with walking and/or transfers;A lot of help with bathing/dressing/bathroom;Assistance with cooking/housework;Direct supervision/assist for medications management;Direct supervision/assist for financial management;Assist for transportation;Help with stairs or ramp for entrance;Supervision due to cognitive status   Equipment Recommendations  Other (comment) (defer)    Recommendations for Other Services      Precautions / Restrictions Precautions Precautions: Fall;Other (comment) (AMS) Restrictions Weight Bearing Restrictions: No       Mobility Bed Mobility Overal bed mobility: Needs Assistance Bed Mobility: Supine to Sit, Sit to Supine, Rolling Rolling: Max assist (rolling in bed for sheet/gown change)   Supine to sit: Max assist Sit to supine: Max assist   General bed mobility comments: even with verbal and tactile cues, pt continues to require MAX A for bed mobility.    Transfers                   General transfer comment: not attempted this session; pt remained seated EOB     Balance Overall balance assessment: Needs assistance Sitting-balance support: Feet supported Sitting balance-Leahy Scale: Poor Sitting balance - Comments: sat up EOB approx 10  minutes Postural control: Posterior lean                                 ADL either performed or assessed with clinical judgement   ADL Overall ADL's : Needs assistance/impaired     Grooming: Wash/dry face;Oral care;Brushing hair;Set up;Moderate assistance;Sitting Grooming Details (indicate cue type and reason): Pt requiring assistance for coming hair (did slight combing on L side with L hand, then dropped comb); set up for briefly brushing teeth with L hand (not very thoroughly). Used LUE to wash face with set up. OT continuously providing CGA-MOD A for sitting balanace at EOB. Upper Body Bathing: Total assistance;Bed level Upper Body Bathing Details (indicate cue type and reason): daughter assisted to wipe pt's back and buttocks while pt sidelying in bed after OT and daughter changed sheets.                         Functional mobility during ADLs:  (did not attempt today) General ADL Comments: Pt with increased need for ADL assistance compared to baseline.    Extremity/Trunk Assessment Upper Extremity Assessment Upper Extremity Assessment: Generalized weakness;RUE deficits/detail RUE Deficits / Details: Per daughter, pt is right-handed. However, today he is much more readily taking items for grooming with L hand to use them; OT attempted to engage pt in grooming with R hand, but he was not doing so. Pt able to give OT a high-five at shoulder elevation in front of him with R hand when instructed to. Appears to have less R sided  attention.   Lower Extremity Assessment Lower Extremity Assessment: Defer to PT evaluation;Generalized weakness        Vision       Perception     Praxis      Cognition Arousal: Alert (appears slightly fatigued) Behavior During Therapy: Flat affect Overall Cognitive Status: Impaired/Different from baseline                                 General Comments: Per daughter at beside, pt is typically able to do BADLs  independently and is more alert and participatory. Today appears confused; following one-step commands intermittently; physically quite weak.        Exercises      Shoulder Instructions       General Comments Very supportive daughter in room throughout session.    Pertinent Vitals/ Pain       Pain Assessment Pain Assessment: No/denies pain  Home Living                                          Prior Functioning/Environment              Frequency  Min 1X/week        Progress Toward Goals  OT Goals(current goals can now be found in the care plan section)  Progress towards OT goals: Not progressing toward goals - comment (pt with decreasing function compared to last session)  Acute Rehab OT Goals Patient Stated Goal: did not state OT Goal Formulation: With patient/family Time For Goal Achievement: 03/11/23 Potential to Achieve Goals: Fair ADL Goals Pt Will Perform Grooming: with supervision;with set-up;with adaptive equipment Pt Will Perform Lower Body Dressing: with supervision;with set-up;sitting/lateral leans Pt Will Transfer to Toilet: with supervision;with set-up;ambulating;regular height toilet  Plan      Co-evaluation                 AM-PAC OT "6 Clicks" Daily Activity     Outcome Measure   Help from another person eating meals?: A Little Help from another person taking care of personal grooming?: A Lot Help from another person toileting, which includes using toliet, bedpan, or urinal?: Total Help from another person bathing (including washing, rinsing, drying)?: A Lot Help from another person to put on and taking off regular upper body clothing?: A Lot Help from another person to put on and taking off regular lower body clothing?: Total 6 Click Score: 11    End of Session    OT Visit Diagnosis: Unsteadiness on feet (R26.81);Other abnormalities of gait and mobility (R26.89);Muscle weakness (generalized) (M62.81);History of  falling (Z91.81)   Activity Tolerance Patient limited by fatigue   Patient Left in bed;with call bell/phone within reach;with bed alarm set;with nursing/sitter in room;with family/visitor present   Nurse Communication Mobility status;Other (comment) (pt need for purewick and new sacral mepilex)        Time: 8469-6295 OT Time Calculation (min): 38 min  Charges: OT General Charges $OT Visit: 1 Visit OT Treatments $Self Care/Home Management : 38-52 mins  Linward Foster, MS, OTR/L   Alvester Morin 03/02/2023, 10:16 AM

## 2023-03-02 NOTE — Progress Notes (Signed)
Per Dr Dwyane Dee, dc tele monitoring

## 2023-03-03 DIAGNOSIS — W19XXXA Unspecified fall, initial encounter: Secondary | ICD-10-CM | POA: Diagnosis not present

## 2023-03-03 DIAGNOSIS — L03115 Cellulitis of right lower limb: Secondary | ICD-10-CM | POA: Diagnosis not present

## 2023-03-03 LAB — CBC
HCT: 32.6 % — ABNORMAL LOW (ref 39.0–52.0)
Hemoglobin: 10.6 g/dL — ABNORMAL LOW (ref 13.0–17.0)
MCH: 31.5 pg (ref 26.0–34.0)
MCHC: 32.5 g/dL (ref 30.0–36.0)
MCV: 96.7 fL (ref 80.0–100.0)
Platelets: 269 10*3/uL (ref 150–400)
RBC: 3.37 MIL/uL — ABNORMAL LOW (ref 4.22–5.81)
RDW: 13.2 % (ref 11.5–15.5)
WBC: 10.1 10*3/uL (ref 4.0–10.5)
nRBC: 0 % (ref 0.0–0.2)

## 2023-03-03 LAB — BASIC METABOLIC PANEL
Anion gap: 7 (ref 5–15)
BUN: 34 mg/dL — ABNORMAL HIGH (ref 8–23)
CO2: 22 mmol/L (ref 22–32)
Calcium: 8.5 mg/dL — ABNORMAL LOW (ref 8.9–10.3)
Chloride: 108 mmol/L (ref 98–111)
Creatinine, Ser: 1.4 mg/dL — ABNORMAL HIGH (ref 0.61–1.24)
GFR, Estimated: 47 mL/min — ABNORMAL LOW (ref >60)
Glucose, Bld: 91 mg/dL (ref 70–99)
Potassium: 4.3 mmol/L (ref 3.5–5.1)
Sodium: 137 mmol/L (ref 135–145)

## 2023-03-03 LAB — CK: Total CK: 643 U/L — ABNORMAL HIGH (ref 49–397)

## 2023-03-03 MED ORDER — POLYSACCHARIDE IRON COMPLEX 150 MG PO CAPS: 150.0000 mg | ORAL_CAPSULE | Freq: Every day | ORAL | Status: AC

## 2023-03-03 MED ORDER — CYANOCOBALAMIN 1000 MCG PO TABS: 1000.0000 ug | ORAL_TABLET | Freq: Every day | ORAL | Status: AC

## 2023-03-03 MED ORDER — ENOXAPARIN SODIUM 30 MG/0.3ML IJ SOSY
30.0000 mg | PREFILLED_SYRINGE | INTRAMUSCULAR | Status: DC
  Administered 2023-03-03: 30 mg via SUBCUTANEOUS
  Filled 2023-03-03: qty 0.3

## 2023-03-03 MED ORDER — VITAMIN D (ERGOCALCIFEROL) 1.25 MG (50000 UNIT) PO CAPS: 50000.0000 [IU] | ORAL_CAPSULE | ORAL | Status: AC

## 2023-03-03 MED ORDER — AMLODIPINE BESYLATE 5 MG PO TABS: 5.0000 mg | ORAL_TABLET | Freq: Every day | ORAL | Status: DC

## 2023-03-03 MED ORDER — SODIUM CHLORIDE 0.9 % IV SOLN: INTRAVENOUS | Status: DC

## 2023-03-03 MED ORDER — QUETIAPINE FUMARATE 25 MG PO TABS: 25.0000 mg | ORAL_TABLET | Freq: Every day | ORAL | Status: DC

## 2023-03-03 MED ORDER — QUETIAPINE FUMARATE 50 MG PO TABS: 50.0000 mg | ORAL_TABLET | Freq: Every day | ORAL | Status: DC

## 2023-03-03 MED ORDER — ASCORBIC ACID 500 MG PO TABS: 500.0000 mg | ORAL_TABLET | Freq: Every day | ORAL | Status: AC

## 2023-03-03 NOTE — Progress Notes (Signed)
Called and gave report to Mordecai Maes LPN at [facility], [place]  going to Room 325p

## 2023-03-03 NOTE — Progress Notes (Signed)
PHARMACIST - PHYSICIAN COMMUNICATION  CONCERNING:  Enoxaparin (Lovenox) for DVT Prophylaxis    RECOMMENDATION: Patient was prescribed enoxaprin 40mg  q24 hours for VTE prophylaxis.   Filed Weights   02/25/23 0506  Weight: 62 kg (136 lb 11 oz)    Body mass index is 18.03 kg/m.  Estimated Creatinine Clearance: 28.9 mL/min (A) (by C-G formula based on SCr of 1.4 mg/dL (H)).   Patient is candidate for enoxaparin 30mg  every 24 hours based on CrCl <20ml/min or Weight <45kg  DESCRIPTION: Pharmacy has adjusted enoxaparin dose per [facility] policy.  Patient is now receiving enoxaparin 30 mg every 24 hours    Elliot Gurney, PharmD, BCPS Clinical Pharmacist  03/03/2023 8:11 AM

## 2023-03-03 NOTE — TOC Transition Note (Signed)
Transition of Care [facility]) - CM/SW Discharge Note   Patient Details  Name: Jacob Moses MRN: [id number] Date of Birth: 04/11/1928  [facility] CM/SW Contact:  Allena Katz, LCSW Phone Number: 03/03/2023, 12:58 PM   Clinical Narrative:   Pt has orders to discharge to [facility].Gavin Pound with [facility] notified.  Dc summary sent. RN given number for report. Medical necessity printed to unit. MD going to put DNR on chart. CSW let daughter donna know. CSW will call ems once DNR is signed.     Final next level of care: Skilled Nursing Facility Barriers to Discharge: Barriers Resolved   Patient Goals and CMS Choice CMS Medicare.gov Compare Post Acute Care list provided to:: Patient Represenative (must comment) (Daughter)    Discharge Placement                Patient [facility] bed at: [facility] Patient to be transferred to facility by: [facility] Name of family member notified: donna hudson Patient and family notified of of transfer: 03/03/23  Discharge Plan and Services Additional resources added to the After Visit Summary for                                       Social Determinants of Health (SDOH) Interventions SDOH Screenings   Food Insecurity: No Food Insecurity (02/25/2023)  Housing: Patient Declined (02/25/2023)  Transportation Needs: No Transportation Needs (02/25/2023)  Utilities: Not At Risk (02/25/2023)  Tobacco Use: Medium Risk (11/20/2022)     Readmission Risk Interventions     No data to display

## 2023-03-03 NOTE — Care Management Important Message (Signed)
Important Message  Patient Details  Name: Jacob Moses MRN: [id number] Date of Birth: 1927/07/30   Important Message Given:  Yes - Medicare IM     [name] [name] 03/03/2023, 1:23 PM

## 2023-03-03 NOTE — Plan of Care (Signed)
  Problem: Education: Goal: Knowledge of General Education information will improve Description: Including pain rating scale, medication(s)/side effects and non-pharmacologic comfort measures Outcome: Progressing   Problem: Health Behavior/Discharge Planning: Goal: Ability to manage health-related needs will improve Outcome: Progressing   Problem: Clinical Measurements: Goal: Ability to maintain clinical measurements within normal limits will improve Outcome: Progressing Goal: Will remain free from infection Outcome: Progressing Goal: Diagnostic test results will improve Outcome: Progressing Goal: Respiratory complications will improve Outcome: Progressing Goal: Cardiovascular complication will be avoided Outcome: Progressing   Problem: Activity: Goal: Risk for activity intolerance will decrease Outcome: Progressing   Problem: Nutrition: Goal: Adequate nutrition will be maintained Outcome: Progressing   Problem: Coping: Goal: Level of anxiety will decrease Outcome: Progressing   Problem: Elimination: Goal: Will not experience complications related to bowel motility Outcome: Progressing Goal: Will not experience complications related to urinary retention Outcome: Progressing   Problem: Pain Management: Goal: General experience of comfort will improve Outcome: Progressing   Problem: Safety: Goal: Ability to remain free from injury will improve Outcome: Progressing   Problem: Skin Integrity: Goal: Risk for impaired skin integrity will decrease Outcome: Progressing   Problem: Education: Goal: Knowledge of disease and its progression will improve Outcome: Progressing   Problem: Fluid Volume: Goal: Compliance with measures to maintain balanced fluid volume will improve Outcome: Progressing   Problem: Nutritional: Goal: Ability to make healthy dietary choices will improve Outcome: Progressing

## 2023-03-03 NOTE — Discharge Summary (Signed)
[facility] Discharge Summary   Patient: Jacob Moses ZOX:[id number]  PCP: Jaclyn Shaggy, MD  Date of admission: 02/25/2023   Date of discharge:  03/03/2023     Discharge Diagnoses:  Principal Problem:   Fall Active Problems:   Cellulitis   HTN (hypertension)   CKD (chronic kidney disease), stage IIIb   SDH (subdural hematoma) (HCC)   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)   Admitted From: Home Disposition:  SNF   Recommendations for Outpatient Follow-up:  Follow-up with PCP, patient should be seen by an MD in 1 to 2 days, monitor BP and titrate medication accordingly, started amlodipine.  Repeat vitamin D, B12 and iron profile after 3 to 6 months. Follow up LABS/TEST:  as above   Contact information for after-discharge care     Destination     [facility] .   Service: Skilled Nursing Contact information: [address] [place] [address] [phone]                    Diet recommendation: Regular diet  Activity: The patient is advised to gradually reintroduce usual activities, as tolerated  Discharge Condition: stable  Code Status: DNR /DNR limited  History of present illness: As per the H and P dictated on admission Hospital Course:  WOODFIN MAYABB is a 89 y.o. male with medical history significant of HTN, gerd, recurrent falls, SDH,  stage 3 CKD,  dementia presenting with fall and cellulitis.  Patient noted to be living at an independent living facility.  Per report, patient with unwitnessed fall.  Patient was down for several hours per report.  Patient unaware of what happened.  Denies any severe headache, chest pain or shortness of breath.  No reported fevers or chills.  No reported nausea or vomiting.  Had similar issue that required admission July 2024. Presented to the ER afebrile, hemodynamically stable.  Satting well on room air.  White count 12.4, hemoglobin 10.7, platelets 240, lactate 1.1,  creatinine 1.64, potassium 5.3.  CT head with stable 3 to 4 mm subdural hematoma.  CT chest, CT C-spine as well as elbow and wrist imaging stable.     Assessment and Plan: # Fall, + mechanical fall at ILF w/ noted scalp and RLE abrasion  CT with noted stable prior SDH, Continue fall precautions  PT/OT evaluation done recommend SNF placement.  Continue physical therapy. # Cellulitis, Trace RLE cellulitis w/ acute on chronic wound s/p fall  WBC 10.1 today leukocytosis resolved. S/p ceftriaxone 1 g IV daily, given during hospital stay, no need of more antibiotics.  Wound care was consulted, continue wound care and monitor. # HTN (hypertension): started amlodipine 5 mg p.o. daily, monitor BP and titrate medications accordingly. # CKD (chronic kidney disease), stage IIIa --IIIb Cr 1.09---1.64, fluctuating, depending on hydration status.  Patient was given IV fluid during hospital stay, continue oral hydration, encourage for oral water intake.  Patient has dementia may not be drinking enough water. # Dementia: Pleasantly confused and off and on disorientation. Looks to be at baseline  Continue supportive care, fall precautions. # SDH (subdural hematoma) Stable 3-4 mm low-density left superior convexity Subdural Hematoma since July on CT head  10/28 C T Head: 1. No acute intracranial process. Unchanged low-density subdural collection along the left superior cerebral convexity, which measures up to 4 mm. No significant mass effect or midline shift. # Hypothyroid, continue Synthroid # Delirium and Possible sundowning,  10/25 increased  Seroquel 50 mg nightly 10/27 started Seroquel 25 mg p.o. daily, due to delirium and persistent confusion # Elevated CK, s/p IV fluid CK 897--801 --1130--1043---643 trended down with IV fluids.  Continue oral hydration. # Iron deficiency, transferrin saturation 12%.  Started oral iron supplement. # Vitamin D insufficiency, started vitamin D supplement. # Vitamin B12  level 246, goal >400.  Started vitamin B12 1000 mcg p.o. daily.    Body mass index is 18.03 kg/m.  Nutrition Interventions:  Pressure Injury 11/23/22 Leg Left skin   tear right shin scabbed (Active)  11/23/22 0800  Location: Leg  Location Orientation: Left  Staging:   Wound Description (Comments): skin   tear right shin scabbed  Present on Admission: Yes     Patient was seen by physical therapy, who recommended Therapy, SNF placement, which was arranged. On the day of the discharge the patient's vitals were stable, and no other acute medical condition were reported by patient. the patient was felt safe to be discharge at [facility].   Consultants: wound care RN Procedures: None  Discharge Exam: General: Appear in no distress, no Rash; Oral Mucosa Clear, moist. Cardiovascular: S1 and S2 Present, no Murmur, Respiratory: normal respiratory effort, Bilateral Air entry present and no Crackles, no wheezes Abdomen: Bowel Sound present, Soft and no tenderness, no hernia Extremities: no Pedal edema, no calf tenderness, b/l lower extremity wounds, dressing CDI Neurology: AAOx 1 due to dementia, no focal deficits. affect appropriate.  Filed Weights   02/25/23 0506  Weight: 62 kg   Vitals:   03/03/23 0606 03/03/23 0817  BP: (!) 160/87 (!) 152/90  Pulse: 85 61  Resp:  17  Temp: 97.7 F (36.5 C) (!) 97.5 F (36.4 C)  SpO2: 100% 100%    DISCHARGE MEDICATION: Allergies as of 03/03/2023   No Known Allergies      Medication List     STOP taking these medications    ALPRAZolam 0.5 MG 24 hr tablet Commonly known as: XANAX XR   free water Soln   leptospermum manuka honey Pste paste   meclizine 25 MG tablet Commonly known as: ANTIVERT       TAKE these medications    acetaminophen 325 MG tablet Commonly known as: TYLENOL Take 2 tablets (650 mg total) by mouth every 6 (six) hours as needed for mild pain, moderate pain or headache.   amLODipine 5 MG tablet Commonly known  as: NORVASC Take 1 tablet (5 mg total) by mouth daily. Start taking on: March 04, 2023   ascorbic acid 500 MG tablet Commonly known as: VITAMIN C Take 1 tablet (500 mg total) by mouth daily. Start taking on: March 04, 2023   cyanocobalamin 1000 MCG tablet Take 1 tablet (1,000 mcg total) by mouth daily. Start taking on: March 04, 2023   feeding supplement Liqd Take 237 mLs by mouth 3 (three) times daily between meals.   iron polysaccharides 150 MG capsule Commonly known as: NIFEREX Take 1 capsule (150 mg total) by mouth daily. Start taking on: March 04, 2023   levothyroxine 50 MCG tablet Commonly known as: SYNTHROID Take 50 mcg by mouth daily before breakfast.   QUEtiapine 50 MG tablet Commonly known as: SEROQUEL Take 1 tablet (50 mg total) by mouth at bedtime. What changed: Another medication with the same name was added. Make sure you understand how and when to take each.   QUEtiapine 25 MG tablet Commonly known as: SEROQUEL Take 1 tablet (25 mg total) by mouth daily. Start taking  on: March 04, 2023 What changed: You were already taking a medication with the same name, and this prescription was added. Make sure you understand how and when to take each.   Vitamin D (Ergocalciferol) 1.25 MG (50000 UNIT) Caps capsule Commonly known as: DRISDOL Take 1 capsule (50,000 Units total) by mouth every 7 (seven) days. Start taking on: March 06, 2023               Discharge Care Instructions  (From admission, onward)           Start     Ordered   03/03/23 0000  Discharge wound care:       Comments: As above   03/03/23 1246           No Known Allergies Discharge Instructions     Call MD for:  difficulty breathing, headache or visual disturbances   Complete by: As directed    Call MD for:  extreme fatigue   Complete by: As directed    Call MD for:  persistant dizziness or light-headedness   Complete by: As directed    Call MD for:  redness,  tenderness, or signs of infection (pain, swelling, redness, odor or green/yellow discharge around incision site)   Complete by: As directed    Call MD for:  severe uncontrolled pain   Complete by: As directed    Call MD for:  temperature >100.4   Complete by: As directed    Diet - low sodium heart healthy   Complete by: As directed    Discharge instructions   Complete by: As directed    Follow-up with PCP, patient should be seen by an MD in 1 to 2 days, monitor BP and titrate medication accordingly, started amlodipine.  Repeat vitamin D, B12 and iron profile after 3 to 6 months.   Discharge wound care:   Complete by: As directed    As above   Increase activity slowly   Complete by: As directed        The results of significant diagnostics from this hospitalization (including imaging, microbiology, ancillary and laboratory) are listed below for reference.    Significant Diagnostic Studies: CT HEAD WO CONTRAST ( )  Result Date: 03/02/2023 CLINICAL DATA:  Stroke suspected, right arm weakness EXAM: CT HEAD WITHOUT CONTRAST TECHNIQUE: Contiguous axial images were obtained from the base of the skull through the vertex without intravenous contrast. RADIATION DOSE REDUCTION: This exam was performed according to the departmental dose-optimization program which includes automated exposure control, adjustment of the mA and/or kV according to patient size and/or use of iterative reconstruction technique. COMPARISON:  02/25/2023 FINDINGS: Brain: Redemonstrated low-density subdural collection along the left superior cerebral convexity, which measures up to 4 mm, unchanged. No significant mass effect or midline shift. No evidence of acute infarction, hemorrhage, mass, or hydrocephalus. Periventricular white matter changes, likely the sequela of chronic small vessel ischemic disease. 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 Vascular: No hyperdense vessel. Skull: Negative for fracture or focal  lesion. Sinuses/Orbits: Left maxillary mucous retention cyst. Status post bilateral lens replacements. Other: The mastoid air cells are well aerated. IMPRESSION: 1. No acute intracranial process. 2. Unchanged low-density subdural collection along the left superior cerebral convexity, which measures up to 4 mm. No significant mass effect or midline shift. Electronically Signed   By: Wiliam Ke M.D.   On: 03/02/2023 13:27   CT CHEST WO CONTRAST  Result Date: 02/25/2023 CLINICAL DATA:  89 year old male status post fall at  1900 hours. Down until 0400 hours. Pain. EXAM: CT CHEST WITHOUT CONTRAST TECHNIQUE: Multidetector CT imaging of the chest was performed following the standard protocol without IV contrast. RADIATION DOSE REDUCTION: This exam was performed according to the departmental dose-optimization program which includes automated exposure control, adjustment of the mA and/or kV according to patient size and/or use of iterative reconstruction technique. COMPARISON:  Cervical spine CT today.  Chest CT 11/20/2022. FINDINGS: Cardiovascular: Vascular patency is not evaluated in the absence of IV contrast. Stable tortuosity of the thoracic aorta. Calcified aortic atherosclerosis. Calcified coronary artery atherosclerosis. Mild-to-moderate cardiomegaly. No pericardial effusion. Mediastinum/Nodes: No mediastinal hematoma, mass, lymphadenopathy identified in the absence of IV contrast. There are small postinflammatory calcified left hilar and subcarinal lymph nodes redemonstrated. Lungs/Pleura: Major airways are patent. Stable lung volumes since July. Mild dependent atelectasis not significantly changed. No pneumothorax, pleural effusion or pulmonary contusion. No active pulmonary inflammation identified. Upper Abdomen: Chronic upper abdominal surgical clips adjacent to the celiac axis. No upper abdominal free air or free fluid. Visible noncontrast liver, spleen, pancreas, adrenal glands and bowel appears stable.  Retained stool at redundant splenic flexure. Partially visible noncontrast kidneys appear stable. Calcified aortic atherosclerosis. Musculoskeletal: Generalized osteopenia. Subtotal thoracic spinal ankylosis, with intermittent costovertebral ankylosis also. Visible shoulder osseous structures appear intact. No acute right rib fracture identified. No acute left rib fracture identified. Sternum appears stable and intact. Ankylosed T1 and T2 segments appear stable and intact. T3 through T5 partial posterior element ankylosis. Those levels appears stable. Mild T6 superior endplate compression is stable. Chronic T6 through T11 ankylosis, those levels appears stable. Mild chronic T11 inferior endplate compression. Unfused T12 vertebra appears stable with mild chronic compression. L1-L2 interbody ankylosis, those levels appears stable. IMPRESSION: 1. No acute traumatic injury identified in the noncontrast Chest. 2. 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 3.  Aortic Atherosclerosis (ICD10-I70.0), tortuosity. Electronically Signed   By: Odessa Fleming M.D.   On: 02/25/2023 06:57   DG Wrist Complete Left  Result Date: 02/25/2023 CLINICAL DATA:  89 year old male status post fall at 1900 hours. Down until 0400 hours. Pain. EXAM: LEFT WRIST - COMPLETE 3+ VIEW COMPARISON:  Left wrist series 02/22/2019. FINDINGS: Distal radius and ulnar styloid fractures in 2020 with interval healing. Residual distal radius impaction deformity. Maintained carpal bone alignment and joint spaces. Moderate to severe chronic 1st CMC joint osteoarthritis. No new fracture or dislocation is identified. IMPRESSION: No acute osseous abnormality identified. Chronic distal left radius and ulna fractures. Osteoarthritis. Electronically Signed   By: Odessa Fleming M.D.   On: 02/25/2023 06:41   DG Elbow Complete Left  Result Date: 02/25/2023 CLINICAL DATA:  89 year old male status post fall at 1900 hours. Down  until 0400 hours. Pain. EXAM: LEFT ELBOW - COMPLETE 3+ VIEW COMPARISON:  Left elbow series 12/30/2019. FINDINGS: Stable bone mineralization. Stable joint spaces and alignment. No evidence of joint effusion. No acute fracture or dislocation identified. Soft tissue injury and irregularity along the ulnar aspect of the elbow and distal humerus. IMPRESSION: Soft tissue injury. No acute fracture or dislocation identified about the left elbow. Electronically Signed   By: Odessa Fleming M.D.   On: 02/25/2023 06:39   CT Cervical Spine Wo Contrast  Result Date: 02/25/2023 CLINICAL DATA:  89 year old male status post fall at 1900 hours. Down until 0400 hours. Pain. EXAM: CT CERVICAL SPINE WITHOUT CONTRAST TECHNIQUE: Multidetector CT imaging of the cervical spine was performed without intravenous contrast. Multiplanar CT image reconstructions were  also generated. RADIATION DOSE REDUCTION: This exam was performed according to the departmental dose-optimization program which includes automated exposure control, adjustment of the mA and/or kV according to patient size and/or use of iterative reconstruction technique. COMPARISON:  Head CT today.  Cervical spine CT 11/20/2022. FINDINGS: Alignment: Stable.  Stable posterior element alignment. Skull base and vertebrae: Visualized skull base is intact. No atlanto-occipital dissociation. C1 and C2 appear chronically degenerated but intact and aligned. Chronic cervical ankylosis from C3 into the upper thoracic spine. Chronically unfused C1-C2 and C2-C3 levels appears stable. No acute osseous abnormality identified. Soft tissues and spinal canal: No prevertebral fluid or swelling. No visible canal hematoma. Stable visible noncontrast neck soft tissues, mild for age carotid calcified atherosclerosis. Disc levels: Widespread cervical spine ankylosis. Chronic ligamentous hypertrophy at C1-C2. Chronic C1-C2 joint space loss which is moderate to severe on the right. Upper chest: Chest CT today  is reported separately. IMPRESSION: 1. No acute fracture identified in the chronically ankylosed cervical spine. 2. Chest CT today reported separately. Electronically Signed   By: Odessa Fleming M.D.   On: 02/25/2023 06:38   CT HEAD WO CONTRAST ( )  Result Date: 02/25/2023 CLINICAL DATA:  89 year old male status post fall at 1900 hours. Down until 0400 hours. EXAM: CT HEAD WITHOUT CONTRAST TECHNIQUE: Contiguous axial images were obtained from the base of the skull through the vertex without intravenous contrast. RADIATION DOSE REDUCTION: This exam was performed according to the departmental dose-optimization program which includes automated exposure control, adjustment of the mA and/or kV according to patient size and/or use of iterative reconstruction technique. COMPARISON:  Head CT 11/20/2022. FINDINGS: Brain: No significant change in low-density left superior convexity subdural hematoma since July, 3-4 mm in thickness (coronal image 41). No midline shift or intracranial mass effect. No ventriculomegaly. No new intracranial hemorrhage identified. Stable gray-white matter differentiation throughout the brain. Mild to moderate for age patchy white matter hypodensity. Normal basilar cisterns. Vascular: Calcified atherosclerosis at the skull base. No suspicious intracranial vascular hyperdensity. Skull: No acute fracture identified. Sinuses/Orbits: Visualized paranasal sinuses and mastoids are stable and well aerated. Other: No acute orbit or scalp soft tissue injury identified. IMPRESSION: 1. Stable 3-4 mm low-density left superior convexity Subdural Hematoma since July. 2. No new intracranial abnormality or acute traumatic injury identified. Electronically Signed   By: Odessa Fleming M.D.   On: 02/25/2023 06:34    Microbiology: Recent Results (from the past 240 hour(s))  Blood culture (routine x 2)     Status: None   Collection Time: 02/25/23  8:53 AM   Specimen: BLOOD  Result Value Ref Range Status   Specimen  Description BLOOD BLOOD RIGHT ARM  Final   Special Requests   Final    BOTTLES DRAWN AEROBIC AND ANAEROBIC Blood Culture results may not be optimal due to an excessive volume of blood received in culture bottles   Culture   Final    NO GROWTH 5 DAYS Performed at [facility], [address] [place], [place] [address]    Report Status 03/02/2023 FINAL  Final  Blood culture (routine x 2)     Status: None   Collection Time: 02/25/23  8:53 AM   Specimen: BLOOD  Result Value Ref Range Status   Specimen Description BLOOD LEFT ARM  Final   Special Requests   Final    BOTTLES DRAWN AEROBIC AND ANAEROBIC Blood Culture results may not be optimal due to an excessive volume of blood received in culture bottles  Culture   Final    NO GROWTH 5 DAYS Performed at [facility], [address] [place], [place] [address]    Report Status 03/02/2023 FINAL  Final     Labs: CBC: Recent Labs  Lab 02/25/23 0558 02/26/23 1191 02/27/23 4782 02/28/23 0523 03/01/23 0557 03/02/23 0832 03/03/23 0345  WBC 12.4*   < > 14.9* 10.7* 12.2* 12.6* 10.1  NEUTROABS 9.9*  --   --   --   --   --   --   HGB 10.7*   < > 11.9* 11.2* 10.6* 11.2* 10.6*  HCT 33.2*   < > 35.7* 34.5* 32.2* 33.9* 32.6*  MCV 97.9   < > 94.4 96.6 96.4 96.0 96.7  PLT 240   < > 280 256 234 266 269   < > = values in this interval not displayed.   Basic Metabolic Panel: Recent Labs  Lab 02/26/23 0827 02/27/23 9562 02/28/23 0523 03/01/23 0557 03/02/23 0832 03/03/23 0345  NA  --  136 137 136 135 137  K  --  5.0 4.8 4.5 4.3 4.3  CL  --  101 105 107 104 108  CO2  --  25 21* 20* 22 22  GLUCOSE  --  118* 92 92 87 91  BUN  --  30* 36* 30* 27* 34*  CREATININE  --  1.44* 1.45* 1.19 1.09 1.40*  CALCIUM  --  9.1 8.5* 8.2* 8.6* 8.5*  MG 2.2 2.1 2.0 2.0  --   --   PHOS 3.3 3.0 3.5 3.5  --   --    Liver Function Tests: Recent Labs  Lab 02/25/23 0558 02/26/23 0632  AST 26 25  ALT 16 13  ALKPHOS 70 67  BILITOT 0.7  0.6  PROT 7.0 7.0  ALBUMIN 4.1 3.8   No results for input(s): "LIPASE", "AMYLASE" in the last 168 hours. No results for input(s): "AMMONIA" in the last 168 hours. Cardiac Enzymes: Recent Labs  Lab 02/27/23 0608 02/28/23 0523 03/01/23 0557 03/02/23 0832 03/03/23 0345  CKTOTAL 897* 801* 1,130* 1,043* 643*   BNP (last 3 results) No results for input(s): "BNP" in the last 8760 hours. CBG: No results for input(s): "GLUCAP" in the last 168 hours.  Time spent: 35 minutes  Signed:  Gillis Santa  [facility] 03/03/2023 12:46 PM

## 2023-04-05 DEATH — deceased

## 2023-07-08 IMAGING — XA IR CATHETER TUBE CHANGE
1 series · 6 of 6 positions shown · non-contrast
Comparison: IR CT, 02/06/2021.

INDICATION: Urinary retention.  Chronic Foley catheter.

EXAM:
FLUOROSCOPIC GUIDED SUPRAPUBIC CATHETER EXCHANGE

[Series 1: interv standard · 4 acquisitions, 6 frames shown]
[im 1/4]
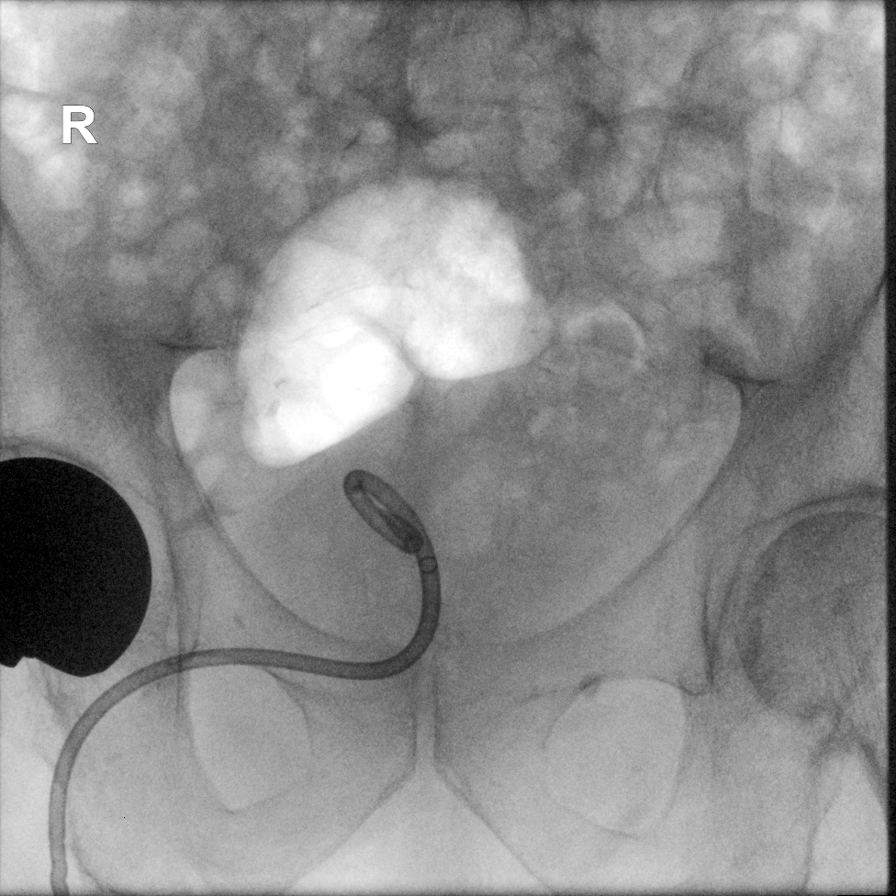
[im 2/4]
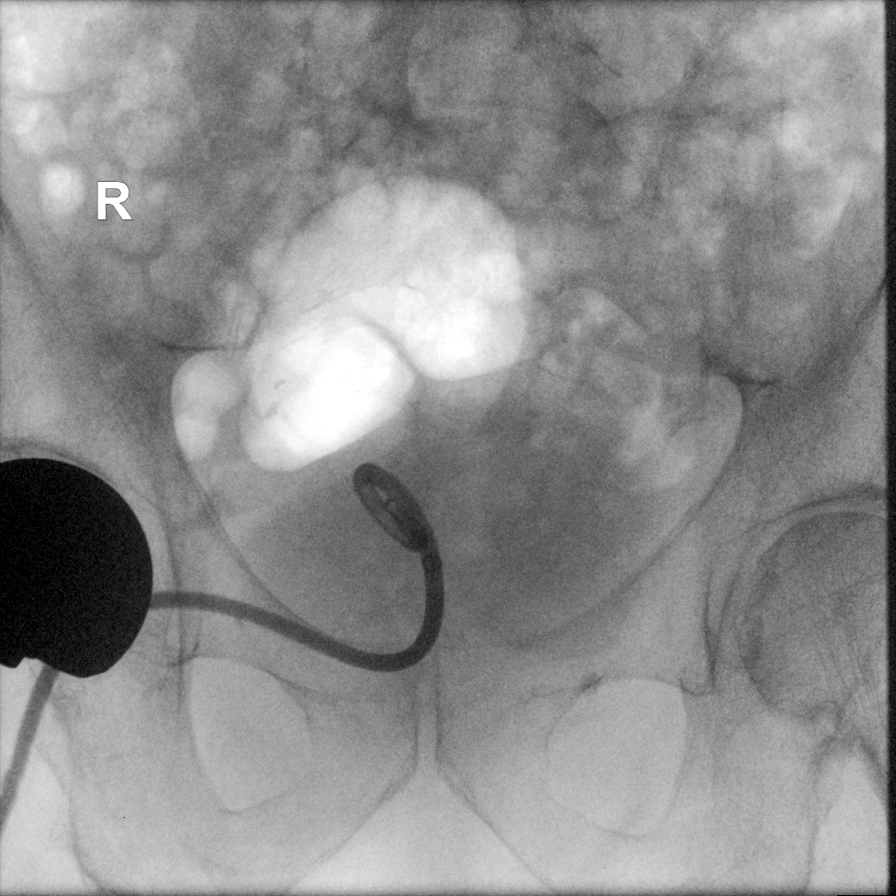
[im 3/4]
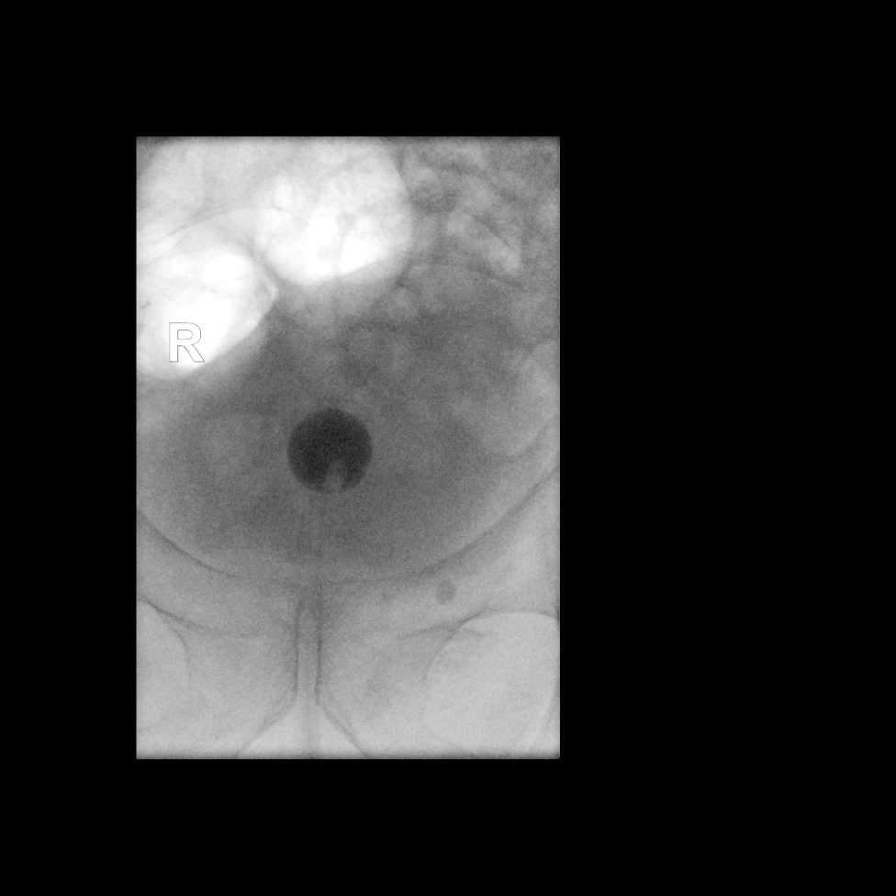
[im 4/4]
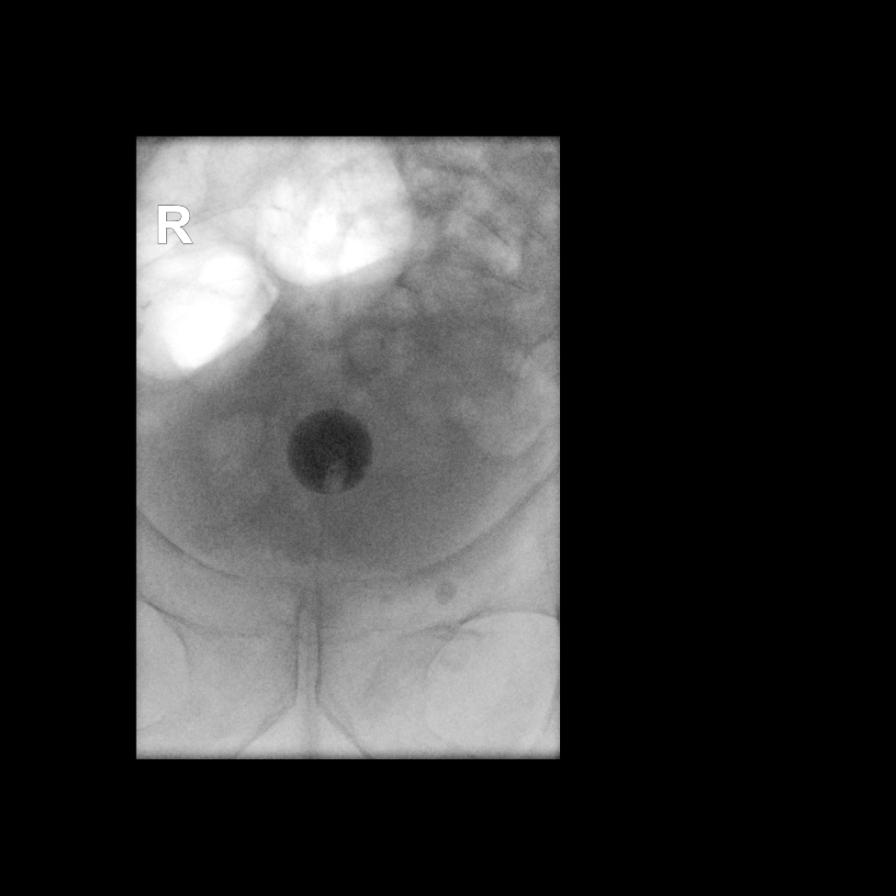
[im 4/4]
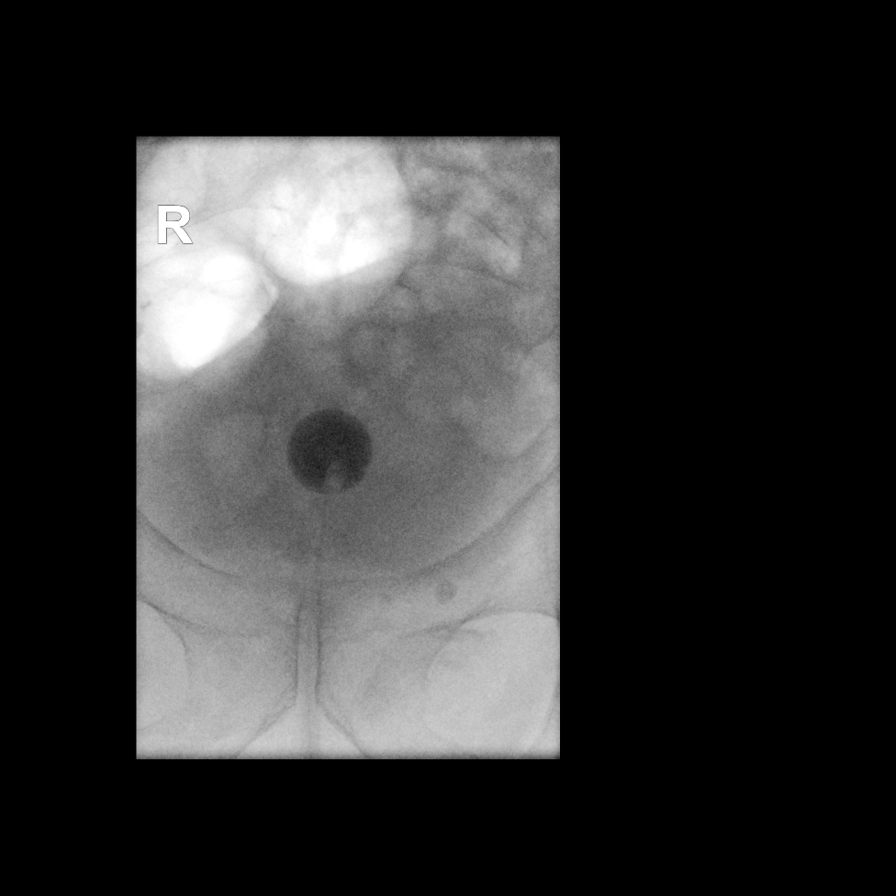
[im 4/4]
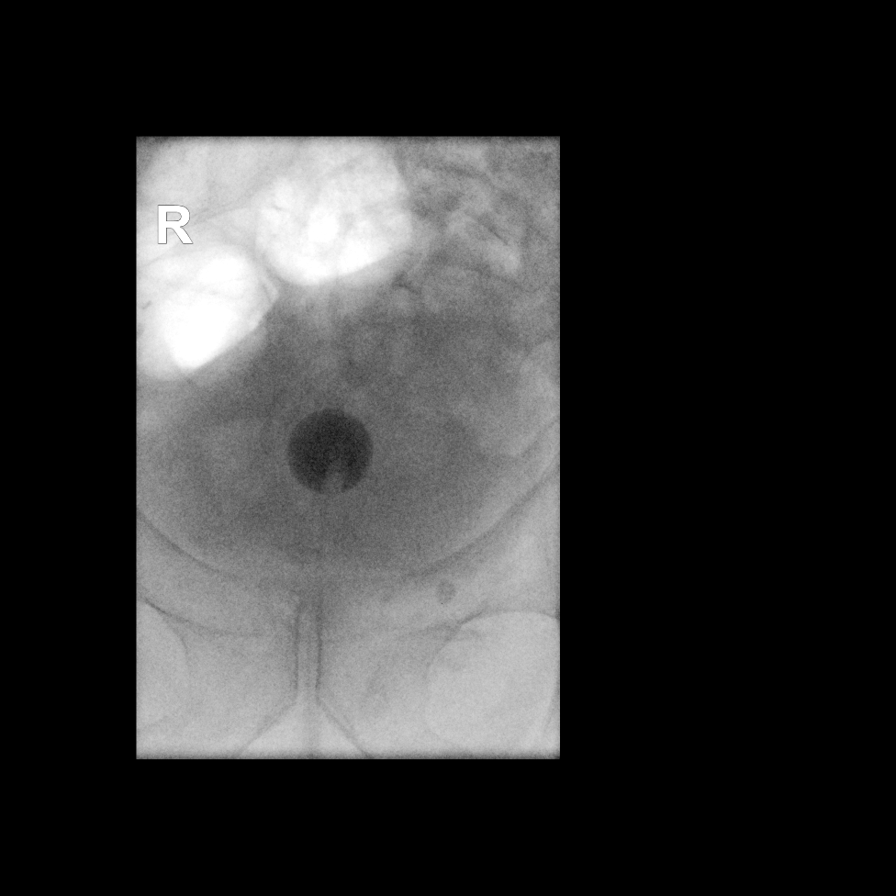

[6 of 6 positions shown; findings below may reference images not displayed]

MEDICATIONS:
None

ANESTHESIA/SEDATION:
None

CONTRAST:  None

FLUOROSCOPY TIME:  1 minute, 24 seconds.  7.9 mGy

COMPLICATIONS:
None immediate.

PROCEDURE:
The procedure, risks, benefits, and alternatives were explained to
the patient. Questions regarding the procedure were encouraged and
answered. The patient understands and consents to the procedure. A
timeout was performed prior to the initiation of the procedure.

The external portion of the existing suprapubic catheter as well as
the surrounding skin were prepped and draped in usual sterile
fashion

A preprocedural spot fluoroscopic image was obtained of the lower
pelvis and existing 14 French all-purpose drainage catheter with end
coiled and locked overlying the expected location of the urinary
bladder.

Contrast injection confirmed appropriate positioning and
functionality existing suprapubic catheter. The external portion of
the existing suprapubic catheter was cut and cannulated with a short
Amplatz wire which was coiled within the urinary bladder. Under
intermittent fluoroscopic guidance, the existing all-purpose
drainage catheter was exchanged for a new 16 Fr Council suprapubic
Foley catheter

Appropriate positioning was confirmed with the injection of a small
amount of contrast as well as the efflux of urine. The catheter was
connected to a gravity bag. A dressing was placed. The patient
tolerated the procedure well without immediate postprocedural
complication.
FINDINGS: Preprocedural imaging demonstrates unchanged positioning of
all-purpose drainage catheter with end coiled and locked over the
expected location of the urinary bladder.
IMPRESSION: Successful guided exchange of 16 Fr Council suprapubic catheter for
chronic urinary bladder decompression, as above.
